# Patient Record
Sex: Male | Born: 1950 | Race: White | Hispanic: No | Marital: Married | Smoking: Former smoker
Health system: Southern US, Community
[De-identification: ages and names within clinical notes are randomized; demographics above are authoritative.]

## PROBLEM LIST (undated history)

## (undated) DIAGNOSIS — J189 Pneumonia, unspecified organism: Secondary | ICD-10-CM

## (undated) DIAGNOSIS — Z8719 Personal history of other diseases of the digestive system: Secondary | ICD-10-CM

## (undated) DIAGNOSIS — M1990 Unspecified osteoarthritis, unspecified site: Secondary | ICD-10-CM

## (undated) DIAGNOSIS — I4891 Unspecified atrial fibrillation: Secondary | ICD-10-CM

## (undated) DIAGNOSIS — I10 Essential (primary) hypertension: Secondary | ICD-10-CM

## (undated) DIAGNOSIS — E039 Hypothyroidism, unspecified: Secondary | ICD-10-CM

## (undated) DIAGNOSIS — I509 Heart failure, unspecified: Secondary | ICD-10-CM

## (undated) DIAGNOSIS — I2510 Atherosclerotic heart disease of native coronary artery without angina pectoris: Secondary | ICD-10-CM

## (undated) DIAGNOSIS — I219 Acute myocardial infarction, unspecified: Secondary | ICD-10-CM

## (undated) DIAGNOSIS — I350 Nonrheumatic aortic (valve) stenosis: Secondary | ICD-10-CM

## (undated) DIAGNOSIS — E119 Type 2 diabetes mellitus without complications: Secondary | ICD-10-CM

## (undated) DIAGNOSIS — G4730 Sleep apnea, unspecified: Secondary | ICD-10-CM

## (undated) DIAGNOSIS — E782 Mixed hyperlipidemia: Secondary | ICD-10-CM

## (undated) DIAGNOSIS — M75100 Unspecified rotator cuff tear or rupture of unspecified shoulder, not specified as traumatic: Secondary | ICD-10-CM

## (undated) HISTORY — DX: Acute myocardial infarction, unspecified: I21.9

## (undated) HISTORY — DX: Unspecified atrial fibrillation: I48.91

## (undated) HISTORY — DX: Essential (primary) hypertension: I10

## (undated) HISTORY — PX: TONSILLECTOMY: SUR1361

## (undated) HISTORY — DX: Unspecified osteoarthritis, unspecified site: M19.90

## (undated) HISTORY — PX: CHOLECYSTECTOMY: SHX55

## (undated) HISTORY — DX: Nonrheumatic aortic (valve) stenosis: I35.0

## (undated) HISTORY — DX: Mixed hyperlipidemia: E78.2

## (undated) HISTORY — DX: Atherosclerotic heart disease of native coronary artery without angina pectoris: I25.10

## (undated) HISTORY — DX: Type 2 diabetes mellitus without complications: E11.9

## (undated) HISTORY — PX: APPENDECTOMY: SHX54

---

## 1996-12-05 DIAGNOSIS — I219 Acute myocardial infarction, unspecified: Secondary | ICD-10-CM

## 1996-12-05 HISTORY — DX: Acute myocardial infarction, unspecified: I21.9

## 2003-03-28 ENCOUNTER — Encounter: Payer: Self-pay | Admitting: Cardiology

## 2003-03-28 ENCOUNTER — Inpatient Hospital Stay (HOSPITAL_COMMUNITY): Admission: RE | Admit: 2003-03-28 | Discharge: 2003-04-05 | Payer: Self-pay | Admitting: *Deleted

## 2003-03-30 ENCOUNTER — Encounter: Payer: Self-pay | Admitting: Thoracic Surgery (Cardiothoracic Vascular Surgery)

## 2003-04-02 ENCOUNTER — Encounter: Payer: Self-pay | Admitting: Thoracic Surgery (Cardiothoracic Vascular Surgery)

## 2003-04-02 ENCOUNTER — Encounter (INDEPENDENT_AMBULATORY_CARE_PROVIDER_SITE_OTHER): Payer: Self-pay | Admitting: *Deleted

## 2003-04-02 DIAGNOSIS — Z951 Presence of aortocoronary bypass graft: Secondary | ICD-10-CM | POA: Insufficient documentation

## 2003-04-02 HISTORY — PX: CORONARY ARTERY BYPASS GRAFT: SHX141

## 2003-04-03 ENCOUNTER — Encounter: Payer: Self-pay | Admitting: Thoracic Surgery (Cardiothoracic Vascular Surgery)

## 2003-04-04 ENCOUNTER — Encounter: Payer: Self-pay | Admitting: Thoracic Surgery (Cardiothoracic Vascular Surgery)

## 2003-04-28 ENCOUNTER — Encounter
Admission: RE | Admit: 2003-04-28 | Discharge: 2003-04-28 | Payer: Self-pay | Admitting: Thoracic Surgery (Cardiothoracic Vascular Surgery)

## 2003-04-28 ENCOUNTER — Encounter: Payer: Self-pay | Admitting: Thoracic Surgery (Cardiothoracic Vascular Surgery)

## 2003-08-07 ENCOUNTER — Ambulatory Visit (HOSPITAL_COMMUNITY): Admission: RE | Admit: 2003-08-07 | Discharge: 2003-08-07 | Payer: Self-pay | Admitting: *Deleted

## 2005-06-14 ENCOUNTER — Ambulatory Visit: Payer: Self-pay | Admitting: Cardiology

## 2005-06-27 ENCOUNTER — Ambulatory Visit: Payer: Self-pay | Admitting: Cardiology

## 2008-12-05 DIAGNOSIS — J189 Pneumonia, unspecified organism: Secondary | ICD-10-CM

## 2008-12-05 HISTORY — DX: Pneumonia, unspecified organism: J18.9

## 2009-03-03 ENCOUNTER — Ambulatory Visit (HOSPITAL_COMMUNITY): Admission: RE | Admit: 2009-03-03 | Discharge: 2009-03-03 | Payer: Self-pay | Admitting: Anesthesiology

## 2009-03-03 ENCOUNTER — Ambulatory Visit: Payer: Self-pay | Admitting: Cardiology

## 2009-03-04 ENCOUNTER — Inpatient Hospital Stay (HOSPITAL_BASED_OUTPATIENT_CLINIC_OR_DEPARTMENT_OTHER): Admission: RE | Admit: 2009-03-04 | Discharge: 2009-03-04 | Payer: Self-pay | Admitting: Cardiology

## 2009-03-04 ENCOUNTER — Ambulatory Visit: Payer: Self-pay | Admitting: Cardiology

## 2009-11-29 ENCOUNTER — Encounter: Payer: Self-pay | Admitting: Cardiology

## 2010-02-23 ENCOUNTER — Encounter: Payer: Self-pay | Admitting: Cardiology

## 2010-03-15 ENCOUNTER — Telehealth: Payer: Self-pay | Admitting: Cardiology

## 2010-06-14 ENCOUNTER — Encounter: Payer: Self-pay | Admitting: Cardiology

## 2010-12-20 ENCOUNTER — Encounter: Payer: Self-pay | Admitting: Cardiology

## 2011-01-03 ENCOUNTER — Encounter: Payer: Self-pay | Admitting: Cardiology

## 2011-01-04 NOTE — Progress Notes (Signed)
Summary: question regarding stress test -march  Phone Note Call from Patient Call back at Home Phone [phone] Call back at [phone]   Caller: Patient Reason for Call: Talk to Nurse Summary of Call: question regarding stress test last march.  Initial call taken by: Lorne Skeens,  March 15, 2010 9:55 AM  Follow-up for Phone Call        [facility] Scherrie Bateman, LPN  March 15, 2010 11:37 AM PT RETURNED CALL  NEEDED INFO SENT TO DR Joylene Draft RE LOW EF STATED ON NUCLEAR STUDY PER OFFICE NOTE FROM 3/ 30/10 EF IS 50% BY CATH WITH PT'S PERMISSION OFFICE NOTE FAXED TO TO DR Rosina Lowenstein NUMBER [phone] Follow-up by: Scherrie Bateman, LPN,  March 15, 2010 4:54 PM

## 2011-01-10 ENCOUNTER — Encounter: Payer: Self-pay | Admitting: Cardiology

## 2011-01-20 NOTE — Letter (Signed)
Summary: External Correspondence/ ECHOCARDIOGRAM REPORT  External Correspondence/ ECHOCARDIOGRAM REPORT   Imported By: Dorise Hiss 01/11/2011 15:55:53  _____________________________________________________________________  External Attachment:    Type:   Image     Comment:   External Document

## 2011-01-20 NOTE — Medication Information (Signed)
Summary: RX Folder/ [facility]  RX Folder/ [facility]   Imported By: Dorise Hiss 01/11/2011 16:03:23  _____________________________________________________________________  External Attachment:    Type:   Image     Comment:   External Document

## 2011-01-20 NOTE — Letter (Signed)
Summary: External Correspondence/ OFFICE VISIT [facility]  External Correspondence/ OFFICE VISIT [facility]   Imported By: Dorise Hiss 01/11/2011 15:59:44  _____________________________________________________________________  External Attachment:    Type:   Image     Comment:   External Document

## 2011-02-11 ENCOUNTER — Encounter: Payer: Self-pay | Admitting: Cardiology

## 2011-02-11 ENCOUNTER — Ambulatory Visit (INDEPENDENT_AMBULATORY_CARE_PROVIDER_SITE_OTHER): Payer: BC Managed Care – PPO | Admitting: Cardiology

## 2011-02-11 DIAGNOSIS — I10 Essential (primary) hypertension: Secondary | ICD-10-CM

## 2011-02-11 DIAGNOSIS — G4733 Obstructive sleep apnea (adult) (pediatric): Secondary | ICD-10-CM | POA: Insufficient documentation

## 2011-02-11 DIAGNOSIS — I359 Nonrheumatic aortic valve disorder, unspecified: Secondary | ICD-10-CM

## 2011-02-11 DIAGNOSIS — I2510 Atherosclerotic heart disease of native coronary artery without angina pectoris: Secondary | ICD-10-CM

## 2011-02-11 DIAGNOSIS — E785 Hyperlipidemia, unspecified: Secondary | ICD-10-CM | POA: Insufficient documentation

## 2011-02-11 DIAGNOSIS — R9430 Abnormal result of cardiovascular function study, unspecified: Secondary | ICD-10-CM

## 2011-02-15 NOTE — Assessment & Plan Note (Signed)
Summary: ABNORNAL  STRESS TEST FH   Visit Type:  Follow-up Primary Provider:  Dr. Doreen Beam   History of Present Illness: 60 year old male referred for cardiology evaluation. He recently underwent a followup Cardiolite for DOT clearance in the setting of known CAD status post CABG. This study result is outlined below.  He indicates no exertional angina or unusual shortness of breath. 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 He underwent previous evaluation with known abnormal myocardial perfusion study in 2010, referred for a cardiac catheterization at that time that demonstrated stable anatomy, treated medically. This report is outlined below.  I reviewed his recent Cardiolite report which indicates inferolateral scar without ischemia, no diagnostic ST segment changes. LVEF was reported at 43%, which is decreased compared to previous assessments, although his LVEF has fluctuated over time by differing modalities. Last echocardiogram done at [facility] is indicated below reporting an LVEF of 70%. We discussed this today.  He otherwise reports compliance with his medications which are reviewed below. No recent use of sublingual nitroglycerin.     Preventive Screening-Counseling & Management  Alcohol-Tobacco     Smoking Status: quit     Year Quit: 1984  Current Medications (verified): 1)  Imdur 30 Mg Xr24h-Tab (Isosorbide Mononitrate) .... Take 1 Tablet By Mouth Once A Day 2)  Synthroid 50 Mcg Tabs (Levothyroxine Sodium) .... Take 1 Tablet By Mouth Once A Day 3)  Glucovance 2.5-500 Mg Tabs (Glyburide-Metformin) .... Take 2 By Mouth in Morning and 1 in Pm 4)  Mobic 15 Mg Tabs (Meloxicam) .... Take 1 Tablet By Mouth Once A Day 5)  Atenolol 25 Mg Tabs (Atenolol) .... Take 1 Tablet By Mouth Once A Day 6)  Caduet 5-20 Mg Tabs (Amlodipine-Atorvastatin) .... Take 1 Tablet By Mouth Once A Day 7)  Nitrostat 0.4 Mg Subl (Nitroglycerin) .Marland Kitchen.. 1 Under Tongue @ Onset  of Chest Pain,you May Repeat Every 5 Min. For Up To 3 Doses. If Chest Pain Continues @3rd  Dose,call 911 & Proceed To Er 8)  Aspirin 325 Mg Tabs (Aspirin) .... Take 1 Tablet By Mouth Once A Day  Allergies (verified): No Known Drug Allergies  Comments:  Nurse/Medical Assistant: The patient's medications and allergies were verbally reviewed with the patient's wife and were updated in the Medication and Allergy Lists.  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 Hyperlipidemia. CAD - 2 vessel s/p CABG Myocardial Infarction - 2000 Diabetes Type 2  Past Surgical History: CABG 4/04, Dr. 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         Otherwise reviewed and negative except as outlined.  Vital Signs:  Patient profile:   60 year old male Height:      68 inches Weight:      224 pounds BMI:     34.18 Pulse rate:   61 / minute BP sitting:   159 / 81  (left arm) Cuff size:   large  Vitals Entered By: Carlye Grippe (February 11, 2011 10:47 AM)  Nutrition  Counseling: Patient's BMI is greater than 25 and therefore counseled on weight management options.  Physical Exam  Additional Exam:  Obese male in no acute distress. HEENT: Conjunctiva and lids normal, oropharynx with moist because it appeared Neck: Supple, no elevated JVP, soft carotid bruits versus radiation of cardiac murmur. No thyromegaly. Lungs: Clear to auscultation,  nonlabored. Cardiac: Regular rate and rhythm with 2-3/6 systolic murmur heard best at the base radiating toward the apex, no S3 gallop. Abdomen: Soft, nontender, bowel sounds present, both adamantly. Skin: Warm and dry. Musculoskeletal: No kyphosis. Extremities: No pitting edema, distal pulses 1-2+. Neuropsychiatric: Alert and oriented x3,  Affect appropriate.   Echocardiogram  Procedure date:  02/23/2010  Findings:      [facility]:  Normal LVEF of 70%, moderate LVH, mild left atrial enlargement, trace mitral regurgitation, trace tricuspid regurgitation, mild aortic stenosis and regurgitation, no pericardial effusion.  Nuclear Study  Procedure date:  01/03/2011  Findings:      Exercise Cardiolite, maximum workload 10.1 METs, no diagnostic ST segment changes. LVEF 43%, moderate RV dilatation, fixed inferolateral defect consistent with scar, t.i.d. ratio 1.2.  Cardiac Cath  Procedure date:  03/04/2009  Findings:       3. The left anterior descending artery courses to the apex leading       into the diagonal, the vessel was calcified.  There is about 30-40%       narrowing here, but it does not appear to be high grade.  There is       mild luminal irregularity of the LAD throughout the remainder, with       luminal irregularities noted at the apical portion of the LAD.  The       diagonal is a moderate-sized vessel that bifurcates and has smooth       40-50% segmental plaquing in the midportion of this vessel.  None       of this appears critical.   4. The circumflex has probably 30-40% ostial narrowing and then it is       basically a competitive filling going into a large marginal.  There       is a small AV circumflex.   5. The saphenous vein graft to the marginal is intact and retrograde.       It fills a marginal that is moderately plaqued and has a fairly       large side branch distal to the graft insertion, the vessel has a       tiny amount of antegrade  filling, but this vessel is known to be       occluded, and there is flow out distally noted on the original set       of angiograms.  This is not evident on the current study.   6. The right coronary artery is totally occluded in its midportion.   7. There is a composite graft that goes to the PDA.  Importantly, this       composite graft consists of the saphenous vein graft, composited       into a short right internal mammary.  At the location of the       junction, there is a slight kink, but it does not appear to be       narrow or flow limiting.  It appears stable from the previous       study.  The vein graft inserts nicely into the PDA and the  antegrade portion of the PDA is widely patent.  The retrograde       portion of the PDA was fairly severely diseased at the time of       surgery, and there is 50-60% narrowing involving this retrograde       portion leading into a small to moderate posterolateral segment.  Impression & Recommendations:  Problem # 1:  CARDIOVASCULAR FUNCTION STUDY, ABNORMAL (ICD-794.30)  Most consistent with inferolateral scar, no active ischemia. LVEF was reported at 43% which, if true, would represent a change since last assessment. We discussed this, and weill plan a 2-D echocardiogram to better clarify LVEF. If in fact this remains in the low normal to normal range as previously assessed, doubt that further evaluation will be required in light of his asymptomatic status, and he should be able to continue on medical therapy with routine follow up in 6 months.  Problem # 2:  CAROTID BRUIT (ICD-785.9)  Could be radiation of cardiac murmur. Will obtain carotid Dopplers to clarify.  Orders: Carotid Duplex (Carotid Duplex)  Problem # 3:  CORONARY ATHEROSCLEROSIS NATIVE CORONARY ARTERY (ICD-414.01)  Status post coronary artery bypass grafting in 2004, outlined above. Patient is symptomatically stable. Cardiac catheterization in 2010 suggested continuing  medical therapy.  His updated medication list for this problem includes:    Imdur 30 Mg Xr24h-tab (Isosorbide mononitrate) .Marland Kitchen... Take 1 tablet by mouth once a day    Atenolol 25 Mg Tabs (Atenolol) .Marland Kitchen... Take 1 tablet by mouth once a day    Nitrostat 0.4 Mg Subl (Nitroglycerin) .Marland Kitchen... 1 under tongue @ onset of chest pain,you may repeat every 5 min. for up to 3 doses. if chest pain continues @3rd  dose,call 911 & proceed to er    Aspirin 325 Mg Tabs (Aspirin) .Marland Kitchen... Take 1 tablet by mouth once a day  Orders: 2-D Echocardiogram (2D Echo)  Problem # 4:  HYPERLIPIDEMIA (ICD-272.4)  Followed by Dr. Sherril Croon.  His updated medication list for this problem includes:    Caduet 5-20 Mg Tabs (Amlodipine-atorvastatin) .Marland Kitchen... Take 1 tablet by mouth once a day  Problem # 5:  HYPERTENSION, BENIGN (ICD-401.1)  Blood pressure elevated today. Continue regular followup with Dr. Sherril Croon.  His updated medication list for this problem includes:    Atenolol 25 Mg Tabs (Atenolol) .Marland Kitchen... Take 1 tablet by mouth once a day    Caduet 5-20 Mg Tabs (Amlodipine-atorvastatin) .Marland Kitchen... Take 1 tablet by mouth once a day    Aspirin 325 Mg Tabs (Aspirin) .Marland Kitchen... Take 1 tablet by mouth once a day  Problem # 6:  AORTIC VALVE DISORDERS (ICD-424.1)  Reportedly mild aortic stenosis by echocardiogram at [facility] last year. Will follow up on this as well.  His updated medication list for this problem includes:    Imdur 30 Mg Xr24h-tab (Isosorbide mononitrate) .Marland Kitchen... Take 1 tablet by mouth once a day    Atenolol 25 Mg Tabs (Atenolol) .Marland Kitchen... Take 1 tablet by mouth once a day    Nitrostat 0.4 Mg Subl (Nitroglycerin) .Marland Kitchen... 1 under tongue @ onset of chest pain,you may repeat every 5 min. for up to 3 doses. if chest pain continues @3rd  dose,call 911 & proceed to er  Patient Instructions: 1)  Your physician wants you to follow-up in: 6 months. You will receive a reminder letter in the mail one-two months in advance. If you don't  receive a letter, please call our office to schedule the follow-up appointment. 2)  Your physician has requested that  you have a carotid duplex. This test is an ultrasound of the carotid arteries in your neck. It looks at blood flow through these arteries that supply the brain with blood. Allow one hour for this exam. There are no restrictions or special instructions.  3)  Your physician has requested that you have an echocardiogram.  Echocardiography is a painless test that uses sound waves to create images of your heart. It provides your doctor with information about the size and shape of your heart and how well your heart's chambers and valves are working.  This procedure takes approximately one hour. There are no restrictions for this procedure. 4)  If the results of your test are normal or stable, you will receive a letter. If they are abnormal, the nurse will contact you by phone.  5)  Your physician recommends that you continue on your current medications as directed. Please refer to the Current Medication list given to you today.

## 2011-02-18 ENCOUNTER — Encounter: Payer: Self-pay | Admitting: Cardiology

## 2011-02-18 DIAGNOSIS — I359 Nonrheumatic aortic valve disorder, unspecified: Secondary | ICD-10-CM

## 2011-02-22 ENCOUNTER — Telehealth: Payer: Self-pay | Admitting: *Deleted

## 2011-02-22 NOTE — Telephone Encounter (Signed)
Pt called for results of Echo and Carotid. Pt notified of results and verbalized understanding. Results will be faxed to Dr. [facility]lege[facility] at [phone]

## 2011-04-19 NOTE — Cardiovascular Report (Signed)
NAMEELDRA, WORD               ACCOUNT NO.:  [id number]   MEDICAL RECORD NO.:  [id number]           PATIENT TYPE:   LOCATION:                                 FACILITY:   PHYSICIAN:  Arturo Morton. 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  He  previously underwent catheterization in 2004 at which time he had an  attempted percutaneous intervention at that time by Dr. Chales Abrahams.  At that  time, he also had a wall motion abnormality involving the inferolateral  segment.  They were unable to open the circumflex, and he subsequently  underwent revascularization surgery by Dr. Cornelius Moras.  Followup  catheterization in 2004 did reveal patent grafts and I have reviewed  those studies.  He has been asymptomatic, but has an abnormal myocardial  perfusion imaging study suggesting ischemia.  He does have known total  occlusion of the circumflex OM after the graft insertion site.  He was  seen by Dr. Daleen Squibb and referred for diagnostic cardiac catheterization.   PROCEDURE:  1. Left heart catheterization.  2. Selective coronary arteriography.  3. Selective left ventriculography.  4. Saphenous vein graft angiography.   DESCRIPTION OF PROCEDURE:  The patient was brought to the  Catheterization Laboratory and prepped and draped in the usual fashion.  Through an anterior puncture, the right femoral artery was easily  entered and a 4-French sheath was placed.  We then took views of the  left and right coronary arteries.  We needed to use a left bypass for  the OM graft, used a right bypass with composite RIMA vein to the distal  right coronary circulation.  There is no internal mammary done.  Following this, central aortic and left ventricular pressures were  measured with pigtail and ventriculography was performed in the RAO  projection.  There were  no major complications with the procedure.  He  was taken to the holding area for sheath removal.   HEMODYNAMIC DATA:  1. The central aortic pressure was 149/78, mean 105.  2. Left ventricular pressure 153/17 with an LVEDP of 32.  There was      minimal gradient on pullback across the aortic valve.   ANGIOGRAPHIC DATA:  1. On plain fluoroscopy, there is moderate calcification of the      coronary vessels.  One can see this in both the LAD and the right      coronary.  2. The left main is a large-caliber vessel that is free of critical      disease.  It tapers at the distal end.  3. The left anterior descending artery courses to the apex leading      into the diagonal, the vessel was calcified.  There is about 30-40%      narrowing here, but it does not appear to be high grade.  There is      mild luminal irregularity of the LAD throughout the remainder, with  luminal irregularities noted at the apical portion of the LAD.  The      diagonal is a moderate-sized vessel that bifurcates and has smooth      40-50% segmental plaquing in the midportion of this vessel.  None      of this appears critical.  4. The circumflex has probably 30-40% ostial narrowing and then it is      basically a competitive filling going into a large marginal.  There      is a small AV circumflex.  5. The saphenous vein graft to the marginal is intact and retrograde.      It fills a marginal that is moderately plaqued and has a fairly      large side branch distal to the graft insertion, the vessel has a      tiny amount of antegrade filling, but this vessel is known to be      occluded, and there is flow out distally noted on the original set      of angiograms.  This is not evident on the current study.  6. The right coronary artery is totally occluded in its midportion.  7. There is a composite graft that goes to the PDA.  Importantly, this      composite graft consists of the saphenous vein graft,  composited      into a short right internal mammary.  At the location of the      junction, there is a slight kink, but it does not appear to be      narrow or flow limiting.  It appears stable from the previous      study.  The vein graft inserts nicely into the PDA and the      antegrade portion of the PDA is widely patent.  The retrograde      portion of the PDA was fairly severely diseased at the time of      surgery, and there is 50-60% narrowing involving this retrograde      portion leading into a small to moderate posterolateral segment.  8. Ventriculography in the RAO projection reveals overall reduced LV      function with a mid inferobasal wall motion abnormality.  This has      been seen on previous studies.   CONCLUSION:  1. Preserved overall left ventricular function with an inferobasal      wall motion abnormality as previously noted.  2. Continued patency of the left anterior descending diagonal system      with scattered plaquing as noted above.  3. Continued patency of the saphenous vein graft into the marginal      with findings as previously noted with an occluded marginal beyond      the graft insertion, but excellent retrograde filling into a      moderately plaqued retrograde circumflex.  4. Patent composite graft representing a saphenous vein graft      composited into an internal mammary, inserted onto the posterior      descending artery with some retrograde narrowing after the      posterior descending artery insertion.   DISPOSITION:  The patient is asymptomatic.  He has a scar with ischemia  noted in the inferolateral segment.  This would correspond to the wall  motion abnormality and also with a known occlusion after the OM graft.  There is also some retrograde abnormalities, but this has been stable  since 2004 and without symptoms of medical approach  to therapy would be  recommended.  Followup will be with Dr. Daleen Squibb.      Arturo Morton. Riley Kill, MD,  [facility]  Electronically Signed     TDS/MEDQ  D:  03/04/2009  T:  03/04/2009  Job:  [id number]   cc:   Doreen Beam, MD  Jesse Sans. Daleen Squibb, MD, [facility]  Clarence H. Cornelius Moras, M.D.

## 2011-04-19 NOTE — Assessment & Plan Note (Signed)
[facility]                       [place] [facility] OFFICE NOTE   NAME:[name], SAKIB NOGUEZ                      MRN:          [id number]  DATE:03/03/2009                            DOB:          08/12/1951    CHIEF COMPLAINT:  Abnormal stress test.   HISTORY OF PRESENT ILLNESS:  Mr. Thaddeus Evitts is a delightful quite  humorous 60 year old married white male who comes today for evaluation  of the above.   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  This was performed at [facility], and read by  Dr. Harland Dingwall.   He exercised for a total of 7 minutes and 42 seconds to achieve work  level of 8 METS.  Maximum heart rate was 138 with 85% of predicted  maximum heart rate.  He had had no chest pain.  No arrhythmias and no ST-  segment changes.  He had normal blood pressure response.   He had inferoposterior basal wall scar and had some peri-infarct  ischemia that including entire inferolateral wall and from the apex to  the base.   PAST MEDICAL HISTORY:  Significant for a myocardial infarction in late  90s.  He subsequently had angina and had to be reevaluated in April  2004.  At that time, his cardiac catheterization demonstrated severe two-  vessel coronary artery disease with an EF of 50%.  He essentially had a  20% of the distal left main, large LAD with diffuse 20-30% stenoses,  large first diagonal with a proximal 50% stenoses, left circumflex was  small with 2 marginal branches with a ostial 30% circumflex.  There was  a severe disease of up to 70% extending along the AV groove, the first  marginal branch had an osteal 50%, second marginal was totally occluded  and the right coronary artery was dominant with 100% occlusion in the  mid section.  This was apparently the cause of his infarct in 2000 or  late 90s.  He had some collaterals in the left system to the distal  right.   PAST  MEDICAL HISTORY:  Significant for type 2 diabetes times about 2  years.  He has a history of hypertension.  He does not smoke.  He has  struggles with his weight and I do not get the feeling he exercises a  lot.   CURRENT MEDICATIONS:  1. Atenolol 25 mg p.o. daily.  2. Isosorbide mononitrate 30 mg a day.  3. Glyburide/metformin 2.5/500 mg 2 in the morning and 1 in the      evening.  4. Caduet 5/20 daily.  5. Meloxicam 15 mg a day.  6. Levothyroxine 50 mcg a day.  7. Aspirin 325 mg a day.  8. Multivitamin daily.   He has no known drug allergies.   SOCIAL HISTORY:  He is a Naval architect as mentioned above.  He is  married.  His wife is with him today and she is very pleasant.  They  have 3 children.   FAMILY HISTORY:  Negative for premature coronary artery disease.  REVIEW OF SYSTEMS:  He has some hearing loss.  He has had history of an  old ulcer about 13 years ago at his stomach.  The rest of the review of  systems are negative.   PHYSICAL EXAMINATION:  VITAL SIGNS:  Blood pressure is 130/80 in the  right arm, pulse is 64 and regular.  His weight is 228.  HEENT:  Premature graying.  Rest of his HEENT was normal.  NECK:  Carotid upstrokes were equal bilaterally without bruits.  Thyroid  is not enlarged.  Trachea is midline.  There is no JVD.  CHEST:  Lungs are clear to auscultation and percussion.  HEART:  A poorly appreciated PMI.  Normal S1 and S2.  No murmur, rub, or  gallop.  ABDOMEN:  Soft with a slightly protuberant abdomen.  EXTREMITIES:  No cyanosis, clubbing, or edema.  Pulses are intact.  NEUROLOGIC:  Intact.  SKIN:  Unremarkable.   His electrocardiogram demonstrates normal sinus rhythm with some LVH  with an old inferoposterior infarct pattern with T-wave changes  particularly in the lateral leads.   Laboratory data from the outside showed an elevated TSH is 6.38 on  January 26, 2009.   ASSESSMENT:  1. Positive nuclear stress study, inferior lateral wall  ischemia.  He      is asymptomatic at this point in time, though he has had a previous      inferoposterior infarct.  His ejection fraction on the nuclear scan      was 39% and by cath 50%.  2. Hypertension.  3. Type 2 diabetes.  4. Hyperlipidemia.  5. Obesity.   RECOMMENDATIONS:  Outpatient cardiac catheterization.  Indications,  risk, and potential benefits have been discussed.  He and his wife  agreed to proceed.  We will proceed with this study tomorrow.  Precath  labs, an x-ray will be obtained.     Thomas C. Daleen Squibb, MD, [facility]  Electronically Signed   TCW/MedQ  DD: 03/03/2009  DT: 03/04/2009  Job #: [id number]   cc:   Doreen Beam, MD

## 2011-04-22 NOTE — Consult Note (Signed)
NAME:  [name], [name]                         ACCOUNT NO.:  [id number]   MEDICAL RECORD NO.:  [id number]                   PATIENT TYPE:  OIB   LOCATION:  2901                                 FACILITY:  [facility]   PHYSICIAN:  Salvatore Decent. Cornelius Moras, M.D.              DATE OF BIRTH:  1951/03/26   DATE OF CONSULTATION:  03/29/2003  DATE OF DISCHARGE:                                   CONSULTATION   PRIMARY CARDIOLOGIST:  Jonelle Sidle, M.D.   PRIMARY CARE PHYSICIAN:  Dhruv Vyas.   REASON FOR CONSULTATION:  Severe two vessel coronary artery disease, status  post attempted percutaneous coronary intervention with class IV unstable  angina.   HISTORY OF PRESENT ILLNESS:  The patient is a 60 year old gentleman from  [place], [place], who works as a Production designer, theatre/television/film  in [place].  He has a history of coronary artery disease,  hypertension, type 2 diabetes mellitus, and a remote history of tobacco use.  He apparently suffered an acute inferior myocardial infarction approximately  four years ago.  He lived in [place] at the time and he underwent  catheterization with attempted percutaneous coronary intervention.  However,  he was told that they were unable to open vessel because it had been closed  too long.  He had been treated medically since then.  He apparently also  underwent repeat catheterization in September of 2002 in [place].  He  was treated medically again at that time and the findings at that  catheterization are unknown.  The patient more recently moved to [facility] where he began to come under the care of Dr. Sherril Croon.  He recently  went to see Dr. Sherril Croon and was noted to complain of increasing symptoms of  angina.  He was referred to Dr. Diona Browner and subsequently scheduled for  elective cardiac catheterization.  This was performed yesterday by Dr.  Chales Abrahams.  After initial diagnostic catheterization, an attempt at percutaneous  coronary intervention for a chronically occluded subbranch of a large  bifurcating circumflex marginal branch was performed.  This was complicated  by acute coronary dissection.  The patient was admitted for further  management and therapy and cardiac surgical consultation has been requested.   REVIEW OF SYSTEMS:  GENERAL:  The patient reports feeling well other than  worsening exertional fatigue.  His appetite is good.  He has not been  gaining or losing weight. CARDIAC:  Notable for longstanding history of  angina, which he states he had ever since his original heart attack four  years ago.  He described substernal chest pressure without radiation.  This  comes and goes sporadically and does seem to be exacerbated by physical  activity but still can come and go even when he is at rest.  His symptoms  have increased in severity in severity in recent months.  Previously his  symptoms usually were  relieved by administration of sublingual  nitroglycerin.  However, more recently he states that nitroglycerin does not  seem to work anymore.  He denies any associated shortness of breath,  diaphoresis, nausea, He denies any syncopal episodes.  He has had occasional  palpitations.  He reports no lower extremity edema, PND, nor orthopnea.  RESPIRATORY:  Essentially negative.  The patient does report dry cough  recently which he attributes to seasonal allergies and sinusitis.  GASTROINTESTINAL:  Negative.  The patient reports good bowel function with  no difficulty swallowing.  He denies history of hematochezia, hematemesis,  melena.  MUSCULOSKELETAL:  Notable for arthritis afflicting both feet.  This  is chronic and stable.  NEUROLOGIC:  Negative.  The patient denies transient  monocular blindness or transient numbness or weakness involving either upper  or lower extremity.  ENDOCRINE:  This is notable in that the patient has  history of type 2 diabetes mellitus.  He apparently went on Weight  Watcher's  several years ago and lost some weight.  Prior to this, he was treated  medically with oral agents for diabetes as well as oral medication for  hypertension.  After he lost the weight, his blood sugars came under better  control and he was able to come off of oral agents and antihypertensive  medications.  INFECTIOUS:  Negative with the exception of the fact that the  patient thinks he has been having problems with sinusitis.  He reports  aching in upper teeth bilaterally recently.  He denies fever or chills.  HEENT:  Otherwise negative other than possible sinusitis.  The patient  reports no loose teeth.  His eyesight has not changed.  PSYCHIATRIC:  Negative.  PERIPHERAL VASCULAR:  Negative.  The patient denies symptoms of  claudication.   PAST MEDICAL HISTORY:  This is notable for history of coronary artery  disease, hypertension, type 2 diabetes mellitus, and arthritis.  The patient  reports that he was treated for peptic ulcers at age 44 but has had no  trouble since.   PAST SURGICAL HISTORY:  This is notable for cholecystectomy in 1980.   SOCIAL HISTORY:  The patient is married and lives with his wife in  [place], [place].  He is reasonably active physically.   HABITS:  He quit smoking 17 years ago.  He denies significant alcohol  consumption.   MEDICATIONS PRIOR TO ADMISSION:  Aspirin, atenolol, Lipitor, isosorbide, and  Arthrotek.   ALLERGIES:  No known drug allergies or sensitivities.   PHYSICAL EXAMINATION:  GENERAL APPEARANCE:  A well-appearing white male who  appears his stated age in no acute distress.  VITAL SIGNS:  He is afebrile, normotensive and in normal sinus rhythm.  HEENT:  Essentially within normal limits.  NECK:  Supple.  There is no jugular venous distension, no carotid bruits are  noted.  LYMPHS:  There is no cervical or supraclavicular lymphadenopathy. CHEST:  Auscultation of the chest demonstrates clear and symmetrical breath  sounds  bilaterally.  No wheezes or rhonchi are appreciated.  ABDOMEN:  Slightly obese, soft, nontender.  There are no palpable masses.  Bowel sounds are present.  RECTAL AND GU:  Examinations are both deferred.  EXTREMITIES:  Lower extremities are warm and well perfused.  Distal pulses  are easily palpable in the posterior tibial position in both lower legs.  There is no veinous insufficiency.  NEUROLOGIC:  Grossly nonfocal and symmetrical throughout.   The remainder of the physical examination is noncontributory.   DIAGNOSTIC TESTS:  Cardiac catheterization performed by Dr. Chales Abrahams yesterday  has been reviewed.  This demonstrates what appears to be chronic occlusion  of the right coronary artery, some left to right collateral filling of  distal branches.  The distal branches of the right coronary circulation are  never well visualized, but there does appear to be a posterior descending  coronary artery and probably one posterolateral branch which might be large  enough for bypass grafting.  There also appears to be chronic occlusion of  the lateral subbranch of the large first circumflex marginal branch.  There  is 70% proximal stenosis of the left circumflex coronary artery beyond the  bifurcation.  The medial subbranches of medium to large vessel.  There was  insignificant disease in the large left anterior descending coronary artery.  Left ventricular function was very mildly reduced with mild inferior  hypokinesis.  An attempt at percutaneous coronary intervention for the  chronically occluded lateral subbranch of the circumflex marginal branch was  performed and complicated by acute coronary dissection.  At completion of  the procedure, the vessel was patent with antegrade flow but the dissection  appeared to compromise the vessel proximal and including the area of its  bifurcation.   IMPRESSION AND RECOMMENDATIONS:  Severe two vessel coronary artery disease  with aggressive symptoms of  class IV angina, status post attempted  percutaneous coronary intervention yesterday complicated by acute coronary  dissection of a moderate size subbranch of circumflex marginal branch.  Under the circumstances, I believe that the patient would probably better be  treated by surgical revascularization.  Alternatively, long-term medical  therapy could be continued.  This would be at some risk of myocardial  infarction related to possible acute occlusion of the circumflex marginal  branch.  However, the lateral subbranch of this vessel may not even be  graftable at the time of surgery because of its recent dissection, or even  with grafting it may be at risk for subsequent occlusion.  Nevertheless, I  believe probably the best long term treatment will be surgery.  With the  absence of significant disease in the left anterior descending coronary  artery there is probably no long-term survival benefit with surgery. However, the patient seems to be having a fair amount of symptoms and I  think he would be better off in the long run.  Furthermore, the circumflex  coronary disease likely compromises what collateral flow currently perfuses  the remainder of the inferior wall that was involved by his previous  myocardial infarction.   PLAN:  I have outlined options at length with the patient and his wife.  All  of their questions have been addressed.  The patient desires to think things  over before making final decision.  We will continue to follow along.                                               Salvatore Decent. Cornelius Moras, M.D.    CHO/MEDQ  D:  03/29/2003  T:  03/31/2003  Job:  [id number]   cc:   Jonelle Sidle, M.D. [facility]   Dhruv Vyas  [address]  [place]  [place] [address]  Fax: [phone]   [facility]  [place], [place]

## 2011-04-22 NOTE — Cardiovascular Report (Signed)
NAME:  [name], [name]                         ACCOUNT NO.:  [id number]   MEDICAL RECORD NO.:  [id number]                   PATIENT TYPE:  OIB   LOCATION:  2899                                 FACILITY:  [facility]   PHYSICIAN:  Veneda Melter, M.D.                   DATE OF BIRTH:  December 26, 1950   DATE OF PROCEDURE:  03/28/2003  DATE OF DISCHARGE:                              CARDIAC CATHETERIZATION   PROCEDURE PERFORMED:  1. Left heart catheterization.  2. Left ventriculogram.  3. Selective coronary angiography.  4. Attempted percutaneous coronary intervention of obtuse marginal 2.   DIAGNOSES:  1. Severe 2-vessel coronary artery disease.  2. Mild left ventricular systolic dysfunction.  3. Unstable angina.   HISTORY:  The patient is a 60 year old gentleman with a history of coronary  artery disease who suffered and inferior wall myocardial infarction in 2000.  At that time, he was found to have occluded RCA.  This was treated  medically.  He has done well until recently when he has had crescendo angina  and has become unstable.  He is referred for further assessment.   TECHNIQUE:  Informed consent was obtained.  The patient was brought to the  catheterization lab.  A 6-French sheath was placed in the right femoral  artery using the modified Seldinger technique.  JL4 and JR4 6-French  catheters were then used to engage the left and right coronary arteries, and  selective angiography was performed in various projections using manual  injections of contrast.  A 6-French pigtail catheter was advanced to the  left ventricle, and left ventriculogram performed using power injections of  contrast in 2 views.  Initial findings are as follows:   FINDINGS:  1. Left main trunk:  Large caliber vessel with distal narrowing of 20%.  2. LAD:  This is a large caliber vessel that provides a large first diagonal     branch in the proximal segment that provides several sub branches.  The     LAD has  mild diffuse disease of 20% to 30%.  The large first diagonal     branch has a proximal narrowing of 50%.  3. Left circumflex artery:  This is a small caliber vessel that provides 2     marginal branches in the mid section.  There is a proximal AV segment     that extends to the distal RCA.  The AV circumflex in the ostial segment     has mild disease of 30%.  Following the AV continuation, there is severe     disease of 70% extending into the distal marginal branches.  The first     marginal branch has an ostial narrowing of 50%.  The second marginal     branch is occluded proximally.  TIMI grade 1 flow is noted distally.  4. Right coronary:  Dominant.  This begins as a large caliber vessel  that     provides a posterior descending artery and 2 posterior ventricular     branches in the terminal segment.  The right coronary artery is 100%     occluded in the mid section.  The PDA and 2 posterior ventricular     branches fill via collaterals from the left coronary system as well as     faint antegrade flow from the RCA.  5. LV:  Mildly dilated end systolic and end diastolic dimensions.  Overall     left ventricular function is low normal.  Ejection fraction approximately     50%.  There is akinesis of the mid inferior wall.  No mitral     regurgitation is appreciated.   HEMODYNAMICS:  1. LV pressure is 125/10.  2. Aortic is 12/70.  3. LVEDP equals 15.   These findings were reviewed with the patient, and we elected to proceed  with attempt at percutaneous intervention of the second marginal branch in  order to stabilize the patient's symptoms.  The patient was given heparin  and Integrilin on a weight-adjusted basis to maintain an ACT of  approximately 250 seconds.  A 7-French sheath was placed in the right groin,  and a 7-French Voda left 3.5 guide catheter was used to engage the left  coronary artery.  Using a 2.5 x 15-mm over-the-wire Maverick balloon for  support, a 0.014-inch extra  support wire was advanced to the takeoff of the  second marginal branch.  Several attempts were made to cross the lesion.  These proved unsuccessful, and repeat angiography suggested vessel  dissection.  Using a Cross-It wire, several further attempts were made, and  the wire passed into the distal vessel.  The balloon, unfortunately, would  not advance, and a 1.5 x 15-mm Maverick balloon was introduced.  This was  successfully advanced into the distal vessel, and manual injection of  contrast confirmed position within the true lumen.  A single inflation was  performed distally at 6 atmospheres for 30 seconds, and 2 additional  inflations proximally at 8 atmospheres for 30 seconds.  Repeat angiography  did not show improvement in vessel flow.  A 2.5 x 15-mm Maverick balloon was  introduced, and 2 inflations performed within the proximal segment at the  site of occlusion at 6 atmospheres for 30 seconds.  Repeat angiography did  show improvement in flow; however, there was evidence of extensive  dissection along the entire length of the second marginal branch.  An  attempt was then made to advance an intravascular ultrasound probe for  assessment; however, this could not be advanced through the AV circumflex.  A 2.25 x 24-mm Pixel stent was introduced, and an attempt made to advance  this to the distal lesion; however, again, this could not be advanced to the  AV circumflex.  At this point, it was felt that further intervention would  be unsuccessful without treatment of the AV circumflex, and there was some  risk of compromise to the collaterals to the distal RCA.  I thus elected to  abort the procedure at this point.  The equipment was removed, and final  angiography performed showing no extravasation of contrast.  The guide  catheter was then removed, and the sheath secured into position.  During the  case, Integrilin and heparin were discontinued.  The patient was then transferred to the  holding area in stable condition.   FINAL RESULT:  Unsuccessful percutaneous coronary intervention of the second  marginal branch,  left circumflex artery, with persistence of 100% occlusion  and TIMI grade 1 flow.   ASSESSMENT AND PLAN:  The patient is a 60 year old gentleman with severe 2-  vessel coronary artery disease.  Due to progression of symptoms, he will be  considered for surgical revascularization.                                               Veneda Melter, M.D.    NG/MEDQ  D:  03/28/2003  T:  03/31/2003  Job:  [id number]   cc:   Doreen Beam  [address]  [place]  [place] [address]  Fax: [phone]   Jonelle Sidle, M.D. [facility]

## 2011-04-22 NOTE — Op Note (Signed)
NAME:  [name], [name]                         ACCOUNT NO.:  [id number]   MEDICAL RECORD NO.:  [id number]                   PATIENT TYPE:  INP   LOCATION:  2306                                 FACILITY:  [facility]   PHYSICIAN:  Salvatore Decent. Cornelius Moras, M.D.              DATE OF BIRTH:  1951-04-22   DATE OF PROCEDURE:  04/02/2003  DATE OF DISCHARGE:                                 OPERATIVE REPORT   PREOPERATIVE DIAGNOSIS:  Severe two-vessel coronary artery disease with  progressive angina, status post attempted percutaneous coronary intervention  of circumflex marginal branch.   POSTOPERATIVE DIAGNOSIS:  Severe two-vessel coronary artery disease with  progressive angina, status post attempted percutaneous coronary intervention  of circumflex marginal branch.   OPERATION PERFORMED:  Median sternotomy for coronary artery bypass grafting  times two (composite saphenous vein and free right internal mammary artery  to posterior descending coronary artery, saphenous vein graft to circumflex  marginal branch with coronary endarterectomy).   SURGEON:  Salvatore Decent. Cornelius Moras, M.D.   ASSISTANT:  Toribio Harbour, R.N.   ANESTHESIA:  General.   INDICATIONS FOR PROCEDURE:  The patient is a 60 year old male from  [place], [place] with history of coronary artery disease status post  inferior myocardial infarction in February 2000.  The patient also has a  history of type 2 diabetes mellitus.  He presents with a two to three-month  history of progressive angina.  He underwent cardiac catheterization on  April 23 by Dr. Chales Abrahams.  This revealed severe two-vessel coronary artery  disease with chronic occlusion of the right coronary artery as well as  chronic occlusion of a large lateral subbranch or the large first circumflex  marginal branch.  The patient underwent attempted percutaneous coronary  intervention of this circumflex marginal branch.  This was complicated by  acute coronary dissection.  The  patient remained stable and is now brought  in for elective surgical revascularization.  A full consultation note has  been dictated previously.   DESCRIPTION OF PROCEDURE:  The patient was brought to the operating room  after providing full informed consent and identified.  He was placed in  supine position on the operating table.  Central monitoring was established  by the anesthesia service under the direction of Dr. Arta Bruce.  Specifically, a Swann-Ganz catheter was placed through the right internal  jugular approach.  A radial arterial line was placed.  Intravenous  antibiotics were administered.  Following induction with general  endotracheal anesthesia, a Foley catheter was placed.  The patient's chest,  abdomen both groins and both lower extremities were prepared and draped in  sterile manner.   A median sternotomy incision was performed.  The right internal mammary  artery was dissected from the chest wall and prepared for bypass grafting.  The right internal mammary artery was slightly small caliber but otherwise  good quality conduit and has excellent forward flow.  Simultaneously,  saphenous vein was obtained from the patient's right thigh, using endoscopic  vein harvest technique.  The majority of the vein is good quality although  portions of it in the middle are somewhat small caliber due to bifurcated  system.  The portions of vein utilized during the procedure were all notably  normal caliber and good quality.  The patient was heparinized systemically.   The pericardium was opened.  The ascending aorta was normal in appearance.  The ascending aorta and right atrium were cannulated for cardiopulmonary  bypass.  Adequate apposition was verified.  Cardiopulmonary bypass was begun  and the surface of the heart was inspected. Distal sites were selected for  coronary artery bypass grafting.  Of note, there is diffuse scarring in the  inferior and posterolateral wall  consistent with old myocardial infarction.  The circumflex marginal branch bifurcates proximally. The medial sub branch  was small and felt too small for grafting.  The lateral sub branch was large  and is the vessel which was recently intervened upon as noted previously.  This specimen was diffusely diseased with dissection present throughout and  appears to be a poor quality target.  The distal right coronary artery gives  rise to posterior descending coronary artery and one posterolateral branch.  The posterolateral branch is too small for grafting.  Both vessels are  notably diseased with atherosclerosis.  Based upon distal targets for  grafting, it is clear that the right internal mammary artery could not be  utilized as an in situ graft.  Therefore, the right internal mammary artery  was transected proximally just after its take off from the subclavian artery  and the proximal stump was doubly oversewn with suture ligatures.  The  posterior descending coronary artery was clearly felt to be the best target  for grafting, so the right internal mammary artery was chosen to be utilized  for this.  However, the right internal mammary artery as a free graft still  is not long enough to reach the site of distal grafting in the proximal  ascending aorta.  Cardioplegia catheter was placed in the ascending aorta.  A temperature probe was placed in the left ventricular septum.  The patient  was cooled to 33 degrees systemic temperature.  The aortic cross-clamp was  applied and cardioplegia was delivered in an antegrade fashion through the  aortic root.  Iced saline slush was applied for topical hypothermia.  The  initial cardioplegic arrest and myocardial cooling were felt to be  excellent. Repeat doses of cardioplegia are administered intermittently  throughout the cross-clamp portion of the operation both through the aortic root and down the subsequently placed vein grafts internal mammary  artery  graft to maintain septal temperature below 15 degrees centigrade. The  following distal coronary anastomoses were performed:  (1)  The circumflex  marginal branch was grafted with a saphenous vein graft  in end-to-side  fashion.  This was the large lateral sub branch of the bifurcating  circumflex marginal branch.  This vessel was very poor quality and on entry  in to the vessel, the vessel was dissected with chronic atherosclerotic  plaque. Endarterectomy was required in order to salvage the vessel.  Carotid  endarterectomy was performed in standard fashion and a large arteriotomy was  created.  A 1.0 probe will pass quite a way distally after completion of the  endarterectomy.  The saphenous vein graft was then sewn in an end-to-side  fashion with a large patch angioplasty.  (2)  The posterior descending  coronary artery was grafted with the right internal mammary artery in end-to-  side fashion.  This vessel measures 1.3 mm in diameter and is of fair  quality at best.  It is fairly diffusely diseased.  A 1.0 probe will pass in  both directions.  Both proximal saphenous vein anastomoses were performed  directly to the ascending aorta prior to the removal of the aortic cross-  clamp.  Because the free internal mammary artery will not reach all the way  to the ascending aorta, a short segment of saphenous vein was sewn initially  to the ascending aorta with subsequent end-to-end anastomosis of the  saphenous vein to the proximal end of the right internal mammary artery  graft.  This was performed after removal of the aortic cross-clamp.  The  aortic cross-clamp was removed after 69 minutes.  The heart began to beat  spontaneously without need for cardioversion.  The end-to-end anastomosis of  the short segment of interposition saphenous vein and the right internal  mammary artery conduit was constructed in an end-to-end fashion with 7-0  Prolene suture.  All proximal and distal  anastomoses were inspected for  hemostasis and appropriate graft orientation.  Epicardial pacing wires were  fixed to the right ventricular outflow tract and to the right atrial  appendage.  The patient was rewarmed to 37 degrees centigrade temperature.   The patient was weaned from cardiopulmonary bypass without difficulty.  The  patient's rhythm at separation from bypass was junctional rhythm.  AV  sequential pacing was employed.  Normal sinus rhythm resumed spontaneously  after separation from bypass.  No inotropic support is required.  Total  cardiopulmonary bypass time for the operation is 103 minutes.   The venous and arterial cannulae were removed uneventfully.  Protamine was  administered to reverse the anticoagulation.  The mediastinum and the right  chest were irrigated with saline solution containing vancomycin.  Meticulous  surgical hemostasis was ascertained.  The mediastinum and the right chest were drained with three chest tubes placed through separate stab incisions  inferiorly. The median sternotomy was closed in routine fashion.  The right  lower extremity incisions were closed in multiple layers in routine fashion.  All skin incisions were closed with  subcuticular skin closures.  The patient tolerated the procedure well and  was transported to the surgical intensive care unit in stable condition.  There were no intraoperative complications.  Sponge, needle and instrument  counts were verified correct at completion of the operation.  No blood  products were administered.                                                Salvatore Decent. Cornelius Moras, M.D.    CHO/MEDQ  D:  04/02/2003  T:  04/02/2003  Job:  [id number]   cc:   Jonelle Sidle, M.D. [facility]   Veneda Melter, M.D.   Doreen Beam  [address]  [place]  [place] [address]  Fax: [phone]

## 2011-04-22 NOTE — Cardiovascular Report (Signed)
NAME:  [name], [name]                         ACCOUNT NO.:  [id number]   MEDICAL RECORD NO.:  [id number]                   PATIENT TYPE:  OIB   LOCATION:  2899                                 FACILITY:  [facility]   PHYSICIAN:  Veneda Melter, M.D.                   DATE OF BIRTH:  [birth year]   DATE OF PROCEDURE:  08/07/2003  DATE OF DISCHARGE:  08/07/2003                              CARDIAC CATHETERIZATION   PROCEDURES PERFORMED:  1. Left heart catheterization.  2. Left ventriculogram.  3. Selective coronary angiography.  4. Selective angiography of saphenous vein graft and renal bypass grafts.   DIAGNOSES:  1. Native two vessel coronary artery disease.  2. Normal left ventricular systolic function.  3. Patent bypass grafts.   HISTORY:  [name] is a 60 year old gentleman with coronary artery  disease who has undergone surgical revascularization on April 02, 2003 after  failed attempt at PCI of the left circumflex artery.  At that time he had  saphenous vein graft to OM 2 with composite of saphenous vein and free RIMA  graft to the PDA of the right coronary artery which was occluded.  He has  done well in the interim.  However, recently he has had some shortness of  breath and chest discomfort.  Cardiolite study showed some lateral ischemia  and he is referred for further assessment.   TECHNIQUE:  Informed consent was obtained.  The patient was brought to the  catheterization laboratory.  A 6-French sheath placed in the right femoral  artery using modified Seldinger technique.  A 6-French JL4 and JR4 catheter  was then used to engage the left and right coronary arteries.  Selective  angiography performed in various projections using manual injections of  contrast.  A JR4 catheter was used to engage the saphenous vein graft to OM  2 and a multipurpose 2 catheter was used to engage the composite graft to  the PDA.  Subsequently, a 6-French pigtail catheter was then advanced left  ventricle and left ventriculogram performed using power injections of  contrast in two views.  At the termination of this case catheters and sheath  were removed and manual pressure applied until adequate hemostasis was  achieved.  The patient tolerated procedure well and was transferred to floor  in stable condition.   FINDINGS:  1. Left main trunk:  Large caliber vessel with distal narrowing of 30%.  2. LAD is a medium caliber vessel that provides bifurcating first diagonal     branch in proximal segment and then extends to the apex.  LAD has mild     disease of 30% in the proximal mid section.  The diagonal branch has     moderate narrowing of 40% in the proximal segment.  3. Left circumflex artery is a small caliber vessel that provides two     marginal branches in the mid section.  The  AV circumflex appears to have     an eccentric plaque of at least 60%.  There is then diffuse disease in     the mid AV segment of 80% extending into the bifurcation with the first     and second marginal branches.  The second marginal branch fills     predominantly via saphenous vein graft and is occluded after the     anastomosis.  4. Right coronary artery is dominant.  This is a medium caliber vessel.     Provides posterior descending artery and two posterior ventricular     branches in terminal segment.  The right coronary artery is 100% occluded     in the mid section.  The posterior descending artery fills via saphenous     vein graft with retrograde filling of the two posterior ventricular     branches.  There is moderate narrowing of 40-50% in the proximal     posterior descending artery.  5. Saphenous vein graft to OM 2 is patent.  6. Composite graft of saphenous vein and free RIMA to the PDA is patent.  7. LV:  Normal end-systolic and end-diastolic dimensions.  Overall left     ventricular function appears well preserved.  Ejection fraction     approximately 55%.  There is akinesis of the  small area in the     inferolateral wall.  No mitral regurgitation is appreciated.  LV pressure     is 140/10.  Aortic is 140/80.  LVEDP equals 18.   ASSESSMENT/PLAN:  [name] is a 60 year old gentleman with two vessel  coronary artery disease status post surgical revascularization.  He has  evidence of small vessel disease involving the mid AV circumflex and first  marginal branch.  This appears to be 2 mm at best and given difficult time  we had advancing equipment into the circumflex at his last intervention and  the small caliber of the vessel, will recommend medical therapy at this  point with aggressive control of his blood pressure and heart rate and  reassessment of his anginal symptoms.  Should he continue to have symptoms  that are poorly controlled, percutaneous intervention may be considered.                                               Veneda Melter, M.D.    Melton Alar  D:  08/07/2003  T:  08/08/2003  Job:  [id number]   cc:   [name], M.D. [facility]   [name]  [address]  [place]  [place] [address]  Fax: [phone]

## 2011-04-22 NOTE — Discharge Summary (Signed)
NAME:  [name]                         ACCOUNT NO.:  [id number]   MEDICAL RECORD NO.:  [id number]                   PATIENT TYPE:  INP   LOCATION:  2012                                 FACILITY:   PHYSICIAN:  Salvatore Decent. Cornelius Moras, M.D.              DATE OF BIRTH:  1951-01-18   DATE OF ADMISSION:  03/28/2003  DATE OF DISCHARGE:  04/05/2003                                 DISCHARGE SUMMARY   ADMISSION DIAGNOSIS:  Chest pain.   ADDITIONAL DISCHARGE DIAGNOSES:  1. Severe two-vessel coronary artery disease.  2. Unstable angina.  3. History of myocardial infarction in 2000.  4. Hypertension.  5. Hyperlipidemia.   PROCEDURES PERFORMED:  1. Coronary artery bypass grafting x2 (saphenous vein graft to the first     obtuse marginal, composite saphenous vein graft in right free right     internal mammary artery to the posterior descending coronary).  2. Coronary endarterectomy of circumflex marginal.  3. Endoscopic vein harvest right thigh.   HISTORY:  The patient is a 60 year old male with a history of coronary  artery disease.  He is status post myocardial infarction in 2000 and had  undergone coronary angiography in 2000 and in 2002.  He has had frequent  angina and had been previously followed by cardiologist in [place].  He was seen in the [facility] office by Dr.  Diona Browner on 03/25/03 with progressive anginal symptoms.  It was felt that he  should come to [facility] for cardiac catheterization and further  evaluation and treatment.  He was admitted on 4/23 and underwent cardiac  catheterization by Dr. Chales Abrahams.  He was found to have severe two-vessel  coronary artery disease which was not felt to be amenable to percutaneous  intervention.  Cardiothoracic surgery consultation was obtained and Dr. Cornelius Moras  agreed that the patient should proceed with surgical revascularization.  The  patient agreed to proceed, however, he wished to be  discharged home and to  return on a leave of absence for surgery.   HOSPITAL COURSE:  He was admitted on 4/28 and taken to the operating room  where he underwent coronary artery bypass graft x2 as described in detail  above.  He tolerated the procedure well and was transferred to the SICU in  stable condition.  Postoperatively, he has done well.  He was extubated  shortly after surgery.  He was hemodynamically stable and doing well on  postoperative day #1.  He was transferred to the floor late in the day on  postoperative day #1.  He has been ambulating in the halls without  difficulty.  He is weaned off supplemental oxygen and has been maintaining  O2 saturations of greater than 90% on room air.  He has been afebrile and  all vital signs have been stable.  He is tolerating a regular diet and  having normal bowel and bladder function.  He has been diuresed back down to  within five pounds of her preoperative weight.  He has been started on  Plavix for his coronary endarterectomy.  His  surgical  incision sites are  healing well.  It is felt that if he continues to do well he will be ready  for discharge home on 04/05/03.   DISCHARGE MEDICATIONS:  1. Enteric coated aspirin 325 mg daily.  2. Plavix 75 mg daily.  3. Atenolol 25 mg daily.  4. Arthrotec 50 mg daily.  5. Lipitor 20 mg daily.  6. Tylox one to two q.4h p.r.n. pain.   DISCHARGE INSTRUCTIONS:  He is to refrain from driving, heavy lifting or  strenuous activity.  He may continue daily walking and use of incentive  spirometer.  He is asked to shower daily and clean his incisions with soap  and water.  He is asked to continue low fat, low sodium diet.   DISCHARGE FOLLOWUP:  He will be scheduled to see Dr. Diona Browner in two weeks  and have a chest x-ray at that visit.  He is also asked to follow up with  Dr. Cornelius Moras on 5/24 at 12 p.m.  He will bring the chest x-ray to this  appointment for Dr. Orvan July review.  He is asked to call our  office if he  experiences any problems or has questions in the interim.     Coral Ceo, P.A.                        Salvatore Decent. Cornelius Moras, M.D.    GC/MEDQ  D:  04/04/2003  T:  04/05/2003  Job:  [id number]   cc:   Jonelle Sidle, M.D. [facility]

## 2011-04-22 NOTE — Discharge Summary (Signed)
NAME:  [name], [name]                         ACCOUNT NO.:  [id number]   MEDICAL RECORD NO.:  [id number]                   PATIENT TYPE:  OIB   LOCATION:  7001                                 FACILITY:  [facility]   PHYSICIAN:  Salvadore Farber, M.D.             DATE OF BIRTH:  [birth year]   DATE OF ADMISSION:  03/28/2003  DATE OF DISCHARGE:  03/30/2003                           DISCHARGE SUMMARY - REFERRING   BRIEF HISTORY:  This is a pleasant 60 year old male with known coronary  artery disease.  He has a history of a late-presentation inferior MI in  2000.  He had undergone coronary angiography on two occasions - once in 2000  and once in 2002.  This patient has frequent angina and had been followed by  a cardiologist in [place], [place].  He was seen in the [place]  office in [place] on March 25, 2003 by Dr. Diona Browner with progressive anginal  symptoms and arrangements were made to admit the patient to [facility] for further evaluation and treatment.  The patient did have a 2-D  echo performed March 25 that showed mild segmental left ventricular  dysfunction with an EF of approximately 50% with inferior hypokinesis.  There was also mild mitral and aortic regurgitation.   PAST MEDICAL HISTORY:  Please see cardiac history as noted above.  The  patient also has a history of hypertension, diabetes mellitus, history of  dyslipidemia, and he is status post cholecystectomy.   ALLERGIES:  No known drug allergies.   SOCIAL HISTORY:  The patient is married.  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  He denies alcohol or tobacco use.   FAMILY HISTORY:  Noncontributory.   HOSPITAL COURSE:  As noted, this patient was admitted to [facility]  on March 28, 2003 for evaluation of chest pain.  He underwent coronary  angiography on the day of admission performed by Dr. Chales Abrahams.  The patient was  found to have severe two-vessel coronary artery  disease with mild LV  dysfunction, EF estimated to be 50%.  The left main had a distal 20% lesion.  The LAD had a 20-30% lesion as well as a 50% large first diagonal.  The  circumflex had two obtuse marginals.  There was a 70% left circumflex after  the A-V segment.  There was a 100% occluded second obtuse marginal.  The RCA  was 100% occluded at the mid portion.  There was a PDA with two PV branches.  It was felt that coronary artery bypass graft surgery would be the best  option for this patient.   The patient was seen in consultation by Dr. Tressie Stalker on March 28, 2003  and a recommendation was made for coronary artery bypass graft surgery.   The patient had a 2-D echo performed on March 28, 2003.  This showed an  ejection fraction  of 50-55%.  The aortic valve was mildly to moderately  calcified consistent with trivial aortic stenosis.  There were moderate  fibrocalcific changes of the aortic root.  The left atrium was mildly  dilated.   Surgery was to be scheduled for Wednesday, April 28.  The patient requested  discharge prior to this to return for the surgery.  Dr. Cornelius Moras cautioned the  patient there is a possibility that he could have an MI before his return.  However, the patient was willing to accept the risks and arrangements were  made to discharge the patient in improved but guarded condition on March 30, 2003.   LABORATORY DATA:  A CBC on April 24 revealed hemoglobin 14.5; hematocrit  42.4; wbc's 10.2; platelets 201,000.  Chemistry profile on April 24 showed  BUN 13, creatinine 0.9, potassium 3.8, glucose 92.  Cardiac enzymes on April  24 showed CK 90, MB 4.8, troponin 0.19.  Lipid profile was pending.  Please  see results of echo as noted above.  Hemoglobin A1c was pending.   CURRENT MEDICATIONS:  1. Imdur 30 mg daily.  2. Tenormin 25 mg daily.  3. Lipitor as previously taken.  4. Aspirin 325 mg daily.  5. Nitroglycerin p.r.n. for chest pain.   ACTIVITY:  The  patient was told to avoid any strenuous activity.   SPECIAL INSTRUCTIONS:  He was told to return to the hospital or call the  [place] office if he had any significant chest discomfort.   DIET:  He was to stay on a low salt, low fat diet.   FOLLOW-UP:  1. He was to return on April 02, 2003 for his coronary artery bypass graft     surgery as instructed by Dr. Cornelius Moras.  2. He was to see Dr. Diona Browner following his surgery.  3. He was to follow up with his primary care physician as needed.   PROBLEM LIST AT TIME OF DISCHARGE:  1. Severe coronary artery disease with coronary artery bypass graft surgery     scheduled for April 02, 2003.  2. Mild left ventricular dysfunction - ejection fraction of approximately     50%.  3. History of hypertension.  4. History of diabetes.  5.     Dyslipidemia.  6. Status post cholecystectomy.  7. Mild valvular abnormalities.     Delton See, P.A. [facility]                  Salvadore Farber, M.D.    DR/MEDQ  D:  03/30/2003  T:  03/31/2003  Job:  [id number]   cc:   Salvatore Decent. Cornelius Moras, M.D.  [address]  [place]  [place] [address]  Fax: [phone]   Doreen Beam  [address]  [place]  [place] [address]  Fax: [phone]   Jonelle Sidle, M.D. [facility]

## 2011-10-11 ENCOUNTER — Encounter: Payer: Self-pay | Admitting: *Deleted

## 2011-10-13 ENCOUNTER — Encounter: Payer: Self-pay | Admitting: Cardiology

## 2011-10-14 ENCOUNTER — Telehealth: Payer: Self-pay | Admitting: Physician Assistant

## 2011-10-14 ENCOUNTER — Ambulatory Visit (INDEPENDENT_AMBULATORY_CARE_PROVIDER_SITE_OTHER): Payer: BC Managed Care – PPO | Admitting: Cardiology

## 2011-10-14 ENCOUNTER — Encounter: Payer: Self-pay | Admitting: Cardiology

## 2011-10-14 VITALS — BP 153/85 | HR 96 | Ht 68.0 in | Wt 219.0 lb

## 2011-10-14 DIAGNOSIS — I2510 Atherosclerotic heart disease of native coronary artery without angina pectoris: Secondary | ICD-10-CM

## 2011-10-14 DIAGNOSIS — I10 Essential (primary) hypertension: Secondary | ICD-10-CM

## 2011-10-14 DIAGNOSIS — E782 Mixed hyperlipidemia: Secondary | ICD-10-CM

## 2011-10-14 DIAGNOSIS — I4891 Unspecified atrial fibrillation: Secondary | ICD-10-CM

## 2011-10-14 DIAGNOSIS — I359 Nonrheumatic aortic valve disorder, unspecified: Secondary | ICD-10-CM

## 2011-10-14 MED ORDER — METOPROLOL SUCCINATE ER 25 MG PO TB24: 25.0000 mg | ORAL_TABLET | Freq: Every day | ORAL | Status: DC

## 2011-10-14 MED ORDER — WARFARIN SODIUM 5 MG PO TABS: 5.0000 mg | ORAL_TABLET | Freq: Every evening | ORAL | Status: DC

## 2011-10-14 MED ORDER — ASPIRIN EC 81 MG PO TBEC: 81.0000 mg | DELAYED_RELEASE_TABLET | Freq: Every day | ORAL | Status: DC

## 2011-10-14 NOTE — Assessment & Plan Note (Signed)
Blood pressure is elevated today. Continue medical therapy, recommend diet and sodium restriction.

## 2011-10-14 NOTE — Patient Instructions (Signed)
   Begin Toprol XL 25mg  daily  Begin Coumadin 5mg  every evening  Decrease Aspirin to 81mg  daily Follow up in  1 month - see above

## 2011-10-14 NOTE — Assessment & Plan Note (Signed)
Multivessel, relatively stable from symptom perspective. Continue medical therapy and observation. Had noninvasive ischemic workup earlier in the year.

## 2011-10-14 NOTE — Telephone Encounter (Signed)
Received outpatient phone call from [facility] - [facility] was unable to receive faxed prescription. I verified the patient's Toprol and Coumadin prescriptions as ordered in Dr. Ival Bible office note with same rx, quantity, and refills.

## 2011-10-14 NOTE — Assessment & Plan Note (Signed)
Continues on Lipitor. 

## 2011-10-14 NOTE — Assessment & Plan Note (Signed)
Overall moderate by echocardiogram.

## 2011-10-14 NOTE — Progress Notes (Signed)
Clinical Summary [name] is a 60 y.o.male presenting for followup. He was seen back in March. He reports no angina, although has had some episodes of shortness of breath. He was taken off of atenolol at some point since his last evaluation. He has not noted any palpitations specifically, although his ECG today shows coarse atrial fibrillation, duration uncertain. CHADS2 score is 2 this point.  Followup echocardiogram from March showed an LVEF of 55-60% with moderately dilated left atrium, overall moderate aortic valve stenosis with mean aortic gradient of 26 mm mercury, peak aortic valve velocity 3.4 m/s, mild mitral regurgitation. Medical therapy and clinical followup was recommended. Carotid Dopplers from that time revealed less than 50% stenosis of the RICA and LICA.  Today we discussed stroke prophylaxis with Coumadin, also possibility of considering a cardioversion attempt if this rhythm persists and is not paroxysmal.  No Known Allergies  Medication list reviewed.  Past Medical History  Diagnosis Date  . Essential hypertension, benign   . Mixed hyperlipidemia   . Coronary atherosclerosis of native coronary artery     2 vessel s/p CABG  . Myocardial infarction 2000  . Type 2 diabetes mellitus   . Aortic stenosis     Moderate    Past Surgical History  Procedure Date  . Coronary artery bypass graft 03/2003    Dr. Cornelius Moras - SVG to OM,composite SVG/RIMA to PDA  . Cholecystectomy     Social History [name] reports that he quit smoking about 28 years ago. His smoking use included Cigarettes. He has a 42 pack-year smoking history. He has never used smokeless tobacco. [name] reports that he does not drink alcohol.  Review of Systems As outlined above. No unusual bleeding problems. No syncope. Otherwise reviewed and negative.  Physical Examination Filed Vitals:   10/14/11 1544  BP: 153/85  Pulse: 96    Obese male in no acute distress.  HEENT: Conjunctiva and lids  normal, oropharynx with moist because it appeared  Neck: Supple, no elevated JVP, soft carotid bruits versus radiation of cardiac murmur. No thyromegaly.  Lungs: Clear to auscultation, nonlabored.  Cardiac: Irregularly irregular with 2-3/6 systolic murmur heard best at the base radiating toward the apex, no S3 gallop.  Abdomen: Soft, nontender, bowel sounds present, both adamantly.  Skin: Warm and dry.  Musculoskeletal: No kyphosis.  Extremities: No pitting edema, distal pulses 1-2+.  Neuropsychiatric: Alert and oriented x3, Affect appropriate.   ECG Reviewed in EMR.    Problem List and Plan

## 2011-10-14 NOTE — Assessment & Plan Note (Signed)
Newly diagnosed, duration uncertain. CHADS 2 score is 2 at this point. We discussed stroke prophylaxis with Coumadin, possibility that some of his symptoms may be actually related to increased heart rate with activity. Plan to initiate Toprol-XL 25 mg daily and enroll him in the Coumadin clinic, concurrent reduction and aspirin 81 mg daily. Pradaxa was not chosen in light of his valvular heart disease. Office followup arranged, may need to consider cardioversion attempt.

## 2011-10-21 ENCOUNTER — Telehealth: Payer: Self-pay | Admitting: *Deleted

## 2011-10-21 ENCOUNTER — Ambulatory Visit (INDEPENDENT_AMBULATORY_CARE_PROVIDER_SITE_OTHER): Payer: BC Managed Care – PPO | Admitting: *Deleted

## 2011-10-21 DIAGNOSIS — Z7901 Long term (current) use of anticoagulants: Secondary | ICD-10-CM | POA: Insufficient documentation

## 2011-10-21 DIAGNOSIS — I4891 Unspecified atrial fibrillation: Secondary | ICD-10-CM

## 2011-10-21 NOTE — Telephone Encounter (Signed)
Pt presents for CCR visit. During this visit he c/o increasing SOB with any exertion. He saw Dr. Sherril Croon this morning who thinks SOB is related to A.fib. Pt wanted to know when DCCV could be done. Notified pt the general rule-of-thumb is to be therapeutic on Coumadin for 4 wks prior to DCCV. This was week #1 therapeutic with Coumadin. HR is irregular today. BP 167/81, HR 75, Sat 97% on room air.  Pt wonders if medications could be causing SOB. He is aware to go to ER for continued worsening DOE. Pt verbalized understanding.

## 2011-10-21 NOTE — Telephone Encounter (Signed)
Medications were just recently modified, heart rate seem to be adequately controlled based on recorded rate of 75, also no oxygen desaturation. For now would continue present course and plan for elective cardioversion, unless his symptoms worsen.

## 2011-10-21 NOTE — Telephone Encounter (Signed)
Pt notified and verbalized understanding.

## 2011-10-28 ENCOUNTER — Ambulatory Visit (INDEPENDENT_AMBULATORY_CARE_PROVIDER_SITE_OTHER): Payer: BC Managed Care – PPO | Admitting: *Deleted

## 2011-10-28 DIAGNOSIS — I4891 Unspecified atrial fibrillation: Secondary | ICD-10-CM

## 2011-10-28 DIAGNOSIS — Z7901 Long term (current) use of anticoagulants: Secondary | ICD-10-CM

## 2011-10-28 LAB — POCT INR: INR: 5.2

## 2011-11-04 ENCOUNTER — Ambulatory Visit (INDEPENDENT_AMBULATORY_CARE_PROVIDER_SITE_OTHER): Payer: BC Managed Care – PPO | Admitting: *Deleted

## 2011-11-04 DIAGNOSIS — I4891 Unspecified atrial fibrillation: Secondary | ICD-10-CM

## 2011-11-04 DIAGNOSIS — Z7901 Long term (current) use of anticoagulants: Secondary | ICD-10-CM

## 2011-11-04 LAB — POCT INR: INR: 2.2

## 2011-11-11 ENCOUNTER — Encounter: Payer: Self-pay | Admitting: *Deleted

## 2011-11-11 ENCOUNTER — Telehealth: Payer: Self-pay | Admitting: *Deleted

## 2011-11-11 ENCOUNTER — Ambulatory Visit (INDEPENDENT_AMBULATORY_CARE_PROVIDER_SITE_OTHER): Payer: BC Managed Care – PPO | Admitting: *Deleted

## 2011-11-11 ENCOUNTER — Encounter: Payer: Self-pay | Admitting: Cardiology

## 2011-11-11 DIAGNOSIS — Z7901 Long term (current) use of anticoagulants: Secondary | ICD-10-CM

## 2011-11-11 DIAGNOSIS — I4891 Unspecified atrial fibrillation: Secondary | ICD-10-CM

## 2011-11-11 LAB — POCT INR: INR: 3.2

## 2011-11-11 NOTE — Telephone Encounter (Signed)
Per [name], pt is ready for cardioversion. Pt requested DCCV on 12/10 or 12/14.   Cardioversion scheduled for Friday, Dec 14 with Dr. Kirke Corin at [facility]. Pt will need to arrive at 0700 for 0900 procedure.   Left message at home and cell numbers for pt to return call regarding scheduling.

## 2011-11-11 NOTE — Telephone Encounter (Signed)
Pt returned call. Notified regarding appt info for DCCV and instructions. Notes fax[facility] to [facility]. Instruction sheet mailed to pt.

## 2011-11-16 ENCOUNTER — Telehealth: Payer: Self-pay | Admitting: *Deleted

## 2011-11-16 NOTE — Telephone Encounter (Signed)
No precert required 

## 2011-11-16 NOTE — Telephone Encounter (Signed)
DCCV at [facility] Nov 18, 2011 Dr. Kirke Corin to do for Dr. Diona Browner

## 2011-11-18 ENCOUNTER — Encounter: Payer: BC Managed Care – PPO | Admitting: *Deleted

## 2011-11-18 DIAGNOSIS — I4891 Unspecified atrial fibrillation: Secondary | ICD-10-CM

## 2011-11-18 HISTORY — PX: CARDIOVERSION: SHX1299

## 2011-11-18 LAB — PROTIME-INR

## 2011-11-25 ENCOUNTER — Ambulatory Visit (INDEPENDENT_AMBULATORY_CARE_PROVIDER_SITE_OTHER): Payer: BC Managed Care – PPO | Admitting: *Deleted

## 2011-11-25 ENCOUNTER — Telehealth: Payer: Self-pay | Admitting: *Deleted

## 2011-11-25 ENCOUNTER — Ambulatory Visit (INDEPENDENT_AMBULATORY_CARE_PROVIDER_SITE_OTHER): Payer: BC Managed Care – PPO | Admitting: Cardiology

## 2011-11-25 ENCOUNTER — Other Ambulatory Visit: Payer: Self-pay | Admitting: Cardiology

## 2011-11-25 ENCOUNTER — Encounter: Payer: Self-pay | Admitting: Cardiology

## 2011-11-25 VITALS — BP 153/74 | HR 89 | Ht 68.0 in | Wt 214.0 lb

## 2011-11-25 DIAGNOSIS — I359 Nonrheumatic aortic valve disorder, unspecified: Secondary | ICD-10-CM

## 2011-11-25 DIAGNOSIS — I4891 Unspecified atrial fibrillation: Secondary | ICD-10-CM

## 2011-11-25 DIAGNOSIS — Z7901 Long term (current) use of anticoagulants: Secondary | ICD-10-CM

## 2011-11-25 DIAGNOSIS — I2510 Atherosclerotic heart disease of native coronary artery without angina pectoris: Secondary | ICD-10-CM

## 2011-11-25 LAB — POCT INR: INR: 2.5

## 2011-11-25 MED ORDER — AMIODARONE HCL 200 MG PO TABS: ORAL_TABLET | ORAL | Status: DC

## 2011-11-25 NOTE — Telephone Encounter (Signed)
Pt notified of results and verbalized understanding.  Pt will keep appt for Coumadin clinic on 12/28. Pt is aware of importance of close INR management with Amiodarone.

## 2011-11-25 NOTE — Patient Instructions (Signed)
Follow up as scheduled. Your physician recommends that you continue on your current medications as directed. Please refer to the Current Medication list given to you today. Your physician has requested that you have an echocardiogram. Echocardiography is a painless test that uses sound waves to create images of your heart. It provides your doctor with information about the size and shape of your heart and how well your heart's chambers and valves are working. This procedure takes approximately one hour. There are no restrictions for this procedure. DO TODAY We will call you with further medication changes after Echo is reviewed.

## 2011-11-25 NOTE — Assessment & Plan Note (Signed)
Recurrent after recent cardioversion. Plan at this time is to followup 2-D echocardiogram in light of possible heart failure symptoms for reassessment of LVEF and degree of aortic stenosis. Depending on this, can then determine next step in antiarrhythmic therapy. Otherwise continue Coumadin. We may advance Toprol-XL dosing eventually.

## 2011-11-25 NOTE — Telephone Encounter (Signed)
Message copied by Arlyss Gandy on Fri Nov 25, 2011  4:02 PM ------      Message from: Jonelle Sidle      Created: Fri Nov 25, 2011  3:23 PM       Reviewed. LVEF still 55% with inferior wall motion abnormality consistent with scar (noted on cardiolite from 1/12). Aortic stenosis has worsened overall, moderate to severe with AVA approximately 1.0 cm2. I think this is most likely the culprit in terms of his dyspnea and CHF symptoms, perhaps also compounded by atrial fibrillation. For now would start Amiodarone 200 mg BID x 2 weeks, then down to once a day, continue current dose Toprol XL, and stay on Coumadin. He is also using Lasix PRN based on weight change. Would like to see him back over the next few weeks to discuss further evaluation. Expect he will need a heart catheterization to further assess AS and status of revascularization.

## 2011-11-25 NOTE — Assessment & Plan Note (Signed)
Overall mild to moderate by echocardiogram in March of this year.

## 2011-11-25 NOTE — Assessment & Plan Note (Signed)
No active angina symptoms. Status post CABG in 2004.

## 2011-11-25 NOTE — Progress Notes (Signed)
Clinical Summary [name] is a 60 y.o.male presenting for followup. He was seen in November. Medical therapy initiated including Toprol-XL and Coumadin in light of atrial fibrillation with CHADS2 score of 2. After adequate anticoagulation, he was referred for elective cardioversion on 12/14, done by Dr. Kirke Flowers, with successful restoration of sinus rhythm initially. It seems that within a few days he reverted to atrial fibrillation again, however does not feel any palpitations. He has had shortness of breath, reportedly was diagnosed with heart failure symptoms at an urgent care, given Lasix for diuresis, and has lost 10 pounds. He feels better, but not at baseline. He reports no chest pain. No peripheral edema.  Echocardiogram from March of this year, as reviewed in the prior note, showed probable mild to moderate aortic stenosis with normal LVEF.  Followup ECG today shows atrial fibrillation. Heart rate is under 100 on current dose of Toprol-XL.  Today we discussed reevaluation of his cardiac structure and function by repeat echocardiogram, also likelihood of initiating antiarrhythmic therapy. We talked about options, and he prefers to try something that can be initiated as an outpatient   No Known Allergies  Medication list reviewed.  Past Medical History  Diagnosis Date  . Essential hypertension, benign   . Mixed hyperlipidemia   . Coronary atherosclerosis of native coronary artery     2 vessel s/p CABG  . Myocardial infarction 2000  . Type 2 diabetes mellitus   . Aortic stenosis     Moderate  . Atrial fibrillation     Past Surgical History  Procedure Date  . Coronary artery bypass graft 03/2003    Dr. Cornelius Flowers - SVG to OM,composite SVG/RIMA to PDA  . Cholecystectomy     Social History [name] reports that he quit smoking about 28 years ago. His smoking use included Cigarettes. He has a 42 pack-year smoking history. He has never used smokeless tobacco. [name] reports  that he does not drink alcohol.  Review of Systems As outlined above, otherwise negative.  Physical Examination Filed Vitals:   11/25/11 1107  BP: 153/74  Pulse: 89    Obese male in no acute distress.  HEENT: Conjunctiva and lids normal, oropharynx with moist mucosa. Neck: Supple, no elevated JVP, soft carotid bruits versus radiation of cardiac murmur. No thyromegaly.  Lungs: Clear to auscultation, diminished, nonlabored.  Cardiac: Irregularly irregular rhythm with 2-3/6 systolic murmur heard best at the base radiating toward the apex, no S3 gallop.  Abdomen: Obese, bowel sounds present.  Skin: Warm and dry.  Musculoskeletal: No kyphosis.  Extremities: No pitting edema, distal pulses 1-2+.  Neuropsychiatric: Alert and oriented x3, Affect appropriate.   ECG Reviewed in EMR.    Problem List and Plan

## 2011-11-30 ENCOUNTER — Encounter: Payer: Self-pay | Admitting: Cardiology

## 2011-12-02 ENCOUNTER — Ambulatory Visit (INDEPENDENT_AMBULATORY_CARE_PROVIDER_SITE_OTHER): Payer: BC Managed Care – PPO | Admitting: *Deleted

## 2011-12-02 ENCOUNTER — Telehealth: Payer: Self-pay | Admitting: *Deleted

## 2011-12-02 DIAGNOSIS — I4891 Unspecified atrial fibrillation: Secondary | ICD-10-CM

## 2011-12-02 DIAGNOSIS — Z7901 Long term (current) use of anticoagulants: Secondary | ICD-10-CM

## 2011-12-02 MED ORDER — FUROSEMIDE 20 MG PO TABS: 20.0000 mg | ORAL_TABLET | ORAL | Status: DC | PRN

## 2011-12-02 NOTE — Telephone Encounter (Signed)
Pt asked to s/w nurse while here for Coumadin visit. Pt states at 3:30 am he woke up with a thud in his chest. He wonders if this was his heart going back in rhythm. Palpable pulse feels irregular to nurse.  Pt needs refill for Lasix (PRN) sent to  [facility] on [address] in [place]. Pt states he's had to take this for SOB. Reinforce to pt that he should only take Lasix based on increased weight not SOB as SOB could likely be caused by AV disease and/or A.fib. Pt verbalized understanding.

## 2011-12-09 ENCOUNTER — Ambulatory Visit (INDEPENDENT_AMBULATORY_CARE_PROVIDER_SITE_OTHER): Payer: BC Managed Care – PPO | Admitting: *Deleted

## 2011-12-09 DIAGNOSIS — I4891 Unspecified atrial fibrillation: Secondary | ICD-10-CM

## 2011-12-09 DIAGNOSIS — Z7901 Long term (current) use of anticoagulants: Secondary | ICD-10-CM

## 2011-12-16 ENCOUNTER — Telehealth: Payer: Self-pay | Admitting: *Deleted

## 2011-12-16 ENCOUNTER — Ambulatory Visit (INDEPENDENT_AMBULATORY_CARE_PROVIDER_SITE_OTHER): Payer: BC Managed Care – PPO | Admitting: Cardiology

## 2011-12-16 ENCOUNTER — Other Ambulatory Visit: Payer: Self-pay | Admitting: Cardiology

## 2011-12-16 ENCOUNTER — Encounter: Payer: Self-pay | Admitting: *Deleted

## 2011-12-16 ENCOUNTER — Ambulatory Visit (INDEPENDENT_AMBULATORY_CARE_PROVIDER_SITE_OTHER): Payer: BC Managed Care – PPO | Admitting: *Deleted

## 2011-12-16 ENCOUNTER — Encounter: Payer: Self-pay | Admitting: Cardiology

## 2011-12-16 VITALS — BP 149/77 | HR 61 | Ht 68.0 in | Wt 216.0 lb

## 2011-12-16 DIAGNOSIS — Z7901 Long term (current) use of anticoagulants: Secondary | ICD-10-CM

## 2011-12-16 DIAGNOSIS — I359 Nonrheumatic aortic valve disorder, unspecified: Secondary | ICD-10-CM

## 2011-12-16 DIAGNOSIS — I5033 Acute on chronic diastolic (congestive) heart failure: Secondary | ICD-10-CM

## 2011-12-16 DIAGNOSIS — I4891 Unspecified atrial fibrillation: Secondary | ICD-10-CM

## 2011-12-16 DIAGNOSIS — G4733 Obstructive sleep apnea (adult) (pediatric): Secondary | ICD-10-CM

## 2011-12-16 DIAGNOSIS — I10 Essential (primary) hypertension: Secondary | ICD-10-CM

## 2011-12-16 DIAGNOSIS — I2510 Atherosclerotic heart disease of native coronary artery without angina pectoris: Secondary | ICD-10-CM

## 2011-12-16 NOTE — Patient Instructions (Signed)
Follow up as scheduled with coumadin clinic and Dr. Diona Browner. Your physician recommends that you go to the [facility] for lab work: Your physician has requested that you have a cardiac catheterization. Cardiac catheterization is used to diagnose and/or treat various heart conditions. Doctors may recommend this procedure for a number of different reasons. The most common reason is to evaluate chest pain. Chest pain can be a symptom of coronary artery disease (CAD), and cardiac catheterization can show whether plaque is narrowing or blocking your heart's arteries. This procedure is also used to evaluate the valves, as well as measure the blood flow and oxygen levels in different parts of your heart. For further information please visit [email or web address] Please follow instruction sheet, as given.

## 2011-12-16 NOTE — Assessment & Plan Note (Signed)
Moderate to perhaps severe aortic stenosis, progressive over time, and potentially symptomatic as detailed above in the setting of atrial fibrillation that has been persistent as well as underlying ischemic heart disease. Recent LVEF in the normal range. We have discussed the matter, and plan is to proceed with a left and right heart catheterization with Dr. Kirke Corin for further hemodynamic evaluation, also reassessment of graft patency. He will be taken off Coumadin for the procedure.

## 2011-12-16 NOTE — Telephone Encounter (Signed)
Pt is scheduled for (L) and (R) heart cath on Jan 16 w/Dr. Kirke Corin per Dr. Diona Browner

## 2011-12-16 NOTE — Assessment & Plan Note (Signed)
Multivessel disease status post CABG. Cardiolite from last year showed inferior scar with no progressive ischemia. Graft patency will be reassessed at catheterization in light of his symptoms.

## 2011-12-16 NOTE — Progress Notes (Signed)
 Clinical Summary [name] is a 61 y.o.male presenting for followup. He was seen in December. He had recurrent atrial fibrillation following initially successful cardioversion. Subsequently he developed heart failure symptoms and was placed on diuretic after visit at an urgent care.  He was referred for a followup echocardiogram that demonstrated LVEF of 55-60% with inferior akinesis, and progressive aortic stenosis into the moderate to severe range with AVA of approximately 1.0 cm. He was placed on amiodarone with continuation of Toprol XL and Coumadin. Lasix was to be used as needed based on weight change.  He is here with his wife today. Still reports dyspnea on exertion, NYHA class 2-3, fatigue. No frank chest pain. Heart rate is well controlled today. Still appears to be atrial fibrillation.  Today we reviewed the findings of his echocardiogram as well as his symptoms. We discussed proceeding to a left and right heart catheterization to better evaluate his hemodynamics as it relates to degree of aortic stenosis, and also exclude any progressive graft disease that may not have been picked up by noninvasive imaging. It is not entirely clear to me that the patient's persistent atrial fibrillation explains all of his symptoms, but is certainly at least partially involved. We also discussed considering repeat cardioversion attempt on amiodarone, once further evaluating the above issues. After reviewing the risks and benefits, he is in agreement to proceed.  No Known Allergies  Current Outpatient Prescriptions  Medication Sig Dispense Refill  . amiodarone (PACERONE) 200 MG tablet Take 200 mg by mouth daily.      . amLODipine-atorvastatin (CADUET) 5-20 MG per tablet Take 1 tablet by mouth daily.        . aspirin EC 81 MG tablet Take 1 tablet (81 mg total) by mouth daily.      . etodolac (LODINE) 500 MG tablet Take 1 tablet by mouth Twice daily.      . furosemide (LASIX) 20 MG tablet Take 1  tablet (20 mg total) by mouth as needed.  30 tablet  1  . glyBURIDE-metformin (GLUCOVANCE) 2.5-500 MG per tablet Take 2 tablets by mouth 2 (two) times daily with a meal.       . isosorbide mononitrate (IMDUR) 30 MG 24 hr tablet Take 30 mg by mouth daily.        . levothyroxine (SYNTHROID, LEVOTHROID) 50 MCG tablet Take 50 mcg by mouth daily.        . metoprolol succinate (TOPROL XL) 25 MG 24 hr tablet Take 1 tablet (25 mg total) by mouth daily.  30 tablet  6  . nitroGLYCERIN (NITROSTAT) 0.4 MG SL tablet Place 0.4 mg under the tongue every 5 (five) minutes as needed.        . VOLTAREN 1 % GEL Apply 1 application topically 4 times daily.      . warfarin (COUMADIN) 5 MG tablet Take 1 tablet (5 mg total) by mouth every evening.  30 tablet  3    Past Medical History  Diagnosis Date  . Essential hypertension, benign   . Mixed hyperlipidemia   . Coronary atherosclerosis of native coronary artery     2 vessel s/p CABG  . Myocardial infarction 2000  . Type 2 diabetes mellitus   . Aortic stenosis     Moderate  . Atrial fibrillation     Past Surgical History  Procedure Date  . Coronary artery bypass graft 03/2003    Dr. Owen - SVG to OM, composite SVG/RIMA to PDA  . Cholecystectomy       History reviewed. No pertinent family history.  Social History [name] reports that he quit smoking about 29 years ago. His smoking use included Cigarettes. He has a 42 pack-year smoking history. He has never used smokeless tobacco. [name] reports that he does not drink alcohol.  Review of Systems Negative except as outlined above.  Physical Examination Filed Vitals:   12/16/11 0859  BP: 149/77  Pulse: 61    Obese male in no acute distress.  HEENT: Conjunctiva and lids normal, oropharynx with moist mucosa.  Neck: Supple, no elevated JVP, soft carotid bruits versus radiation of cardiac murmur. No thyromegaly.  Lungs: Clear to auscultation, diminished, nonlabored.  Cardiac: Irregularly  irregular rhythm with 3/6 systolic murmur heard best at the base radiating toward the apex, no S3 gallop.  Abdomen: Obese, bowel sounds present.  Skin: Warm and dry.  Musculoskeletal: No kyphosis.  Extremities: No pitting edema, distal pulses 1-2+.  Neuropsychiatric: Alert and oriented x3, Affect appropriate.   ECG Reviewed in EMR.   Problem List and Plan   

## 2011-12-16 NOTE — Assessment & Plan Note (Signed)
Persistent following recent cardioversion attempt that was briefly successful. Patient being taken off Coumadin for his catheterization, otherwise will continue with current medications including amiodarone. We may be able to pursue a repeat cardioversion attempt depending on the results of his catheterization.

## 2011-12-16 NOTE — Assessment & Plan Note (Signed)
No changes to current regimen

## 2011-12-16 NOTE — Assessment & Plan Note (Signed)
Reports compliance with CPAP.

## 2011-12-16 NOTE — Telephone Encounter (Signed)
Pt has [employer], no precert required. °

## 2011-12-21 ENCOUNTER — Other Ambulatory Visit: Payer: Self-pay

## 2011-12-21 ENCOUNTER — Encounter (HOSPITAL_BASED_OUTPATIENT_CLINIC_OR_DEPARTMENT_OTHER)
Admission: RE | Disposition: A | Discharge status: Hospice | Payer: Self-pay | Source: Ambulatory Visit | Attending: Cardiovascular Disease

## 2011-12-21 ENCOUNTER — Ambulatory Visit (HOSPITAL_COMMUNITY): Admit: 2011-12-21 | Payer: Self-pay | Admitting: Cardiovascular Disease

## 2011-12-21 ENCOUNTER — Inpatient Hospital Stay (HOSPITAL_BASED_OUTPATIENT_CLINIC_OR_DEPARTMENT_OTHER)
Admission: RE | Admit: 2011-12-21 | Discharge: 2011-12-21 | Disposition: A | Discharge status: Hospice | Payer: BC Managed Care – PPO | Source: Ambulatory Visit | Attending: Cardiovascular Disease | Admitting: Cardiovascular Disease

## 2011-12-21 ENCOUNTER — Ambulatory Visit (HOSPITAL_COMMUNITY)
Admission: RE | Admit: 2011-12-21 | Discharge: 2011-12-21 | Disposition: A | Discharge status: Home / Self Care | Payer: BC Managed Care – PPO | Source: Ambulatory Visit | Attending: Cardiovascular Disease | Admitting: Cardiovascular Disease

## 2011-12-21 ENCOUNTER — Encounter (HOSPITAL_COMMUNITY)
Admission: RE | Disposition: A | Discharge status: Home / Self Care | Payer: Self-pay | Source: Ambulatory Visit | Attending: Cardiovascular Disease

## 2011-12-21 DIAGNOSIS — I2510 Atherosclerotic heart disease of native coronary artery without angina pectoris: Secondary | ICD-10-CM | POA: Insufficient documentation

## 2011-12-21 DIAGNOSIS — I359 Nonrheumatic aortic valve disorder, unspecified: Secondary | ICD-10-CM | POA: Insufficient documentation

## 2011-12-21 DIAGNOSIS — Z951 Presence of aortocoronary bypass graft: Secondary | ICD-10-CM | POA: Insufficient documentation

## 2011-12-21 DIAGNOSIS — R0989 Other specified symptoms and signs involving the circulatory and respiratory systems: Secondary | ICD-10-CM | POA: Insufficient documentation

## 2011-12-21 DIAGNOSIS — R0609 Other forms of dyspnea: Secondary | ICD-10-CM | POA: Insufficient documentation

## 2011-12-21 DIAGNOSIS — I252 Old myocardial infarction: Secondary | ICD-10-CM | POA: Insufficient documentation

## 2011-12-21 DIAGNOSIS — I2789 Other specified pulmonary heart diseases: Secondary | ICD-10-CM | POA: Insufficient documentation

## 2011-12-21 HISTORY — PX: CARDIAC CATHETERIZATION: SHX172

## 2011-12-21 HISTORY — PX: LEFT AND RIGHT HEART CATHETERIZATION WITH CORONARY ANGIOGRAM: SHX5449

## 2011-12-21 LAB — POCT ACTIVATED CLOTTING TIME
Activated Clotting Time: 177 seconds
Activated Clotting Time: 248 seconds

## 2011-12-21 LAB — POCT I-STAT GLUCOSE
Glucose, Bld: 131 mg/dL — ABNORMAL HIGH (ref 70–99)
Operator id: 194801

## 2011-12-21 LAB — POCT I-STAT 3, ART BLOOD GAS (G3+)
Acid-base deficit: 2 mmol/L (ref 0.0–2.0)
O2 Saturation: 91 %

## 2011-12-21 LAB — GLUCOSE, CAPILLARY: Glucose-Capillary: 116 mg/dL — ABNORMAL HIGH (ref 70–99)

## 2011-12-21 LAB — POCT I-STAT 3, VENOUS BLOOD GAS (G3P V)
Acid-base deficit: 2 mmol/L (ref 0.0–2.0)
Bicarbonate: 24.1 mEq/L — ABNORMAL HIGH (ref 20.0–24.0)
pCO2, Ven: 42.6 mmHg — ABNORMAL LOW (ref 45.0–50.0)
pH, Ven: 7.35 — ABNORMAL HIGH (ref 7.250–7.300)
pO2, Ven: 32 mmHg (ref 30.0–45.0)

## 2011-12-21 SURGERY — JV LEFT AND RIGHT HEART CATHETERIZATION WITH CORONARY ANGIOGRAM
Anesthesia: Moderate Sedation

## 2011-12-21 SURGERY — LEFT AND RIGHT HEART CATHETERIZATION WITH CORONARY ANGIOGRAM
Anesthesia: LOCAL

## 2011-12-21 SURGERY — LEFT AND RIGHT HEART CATHETERIZATION WITH CORONARY/GRAFT ANGIOGRAM
Anesthesia: LOCAL

## 2011-12-21 MED ORDER — ONDANSETRON HCL 4 MG/2ML IJ SOLN: 4.0000 mg | Freq: Four times a day (QID) | INTRAMUSCULAR | Status: DC | PRN

## 2011-12-21 MED ORDER — ACETAMINOPHEN 325 MG PO TABS: 650.0000 mg | ORAL_TABLET | ORAL | Status: DC | PRN

## 2011-12-21 MED ORDER — ASPIRIN 81 MG PO CHEW
324.0000 mg | CHEWABLE_TABLET | ORAL | Status: AC
  Administered 2011-12-21: 324 mg via ORAL

## 2011-12-21 MED ORDER — DIAZEPAM 5 MG PO TABS
5.0000 mg | ORAL_TABLET | ORAL | Status: AC
  Administered 2011-12-21: 5 mg via ORAL

## 2011-12-21 MED ORDER — SODIUM CHLORIDE 0.9 % IV SOLN
INTRAVENOUS | Status: DC
  Administered 2011-12-21: 10:00:00 via INTRAVENOUS

## 2011-12-21 MED ORDER — HEPARIN SODIUM (PORCINE) 1000 UNIT/ML IJ SOLN
INTRAMUSCULAR | Status: AC
  Filled 2011-12-21: qty 1

## 2011-12-21 MED ORDER — SODIUM CHLORIDE 0.9 % IV SOLN: INTRAVENOUS | Status: DC

## 2011-12-21 NOTE — Interval H&P Note (Signed)
History and Physical Interval Note:  12/21/2011 10:24 AM  [name]  has presented today for surgery, with the diagnosis of cp  The various methods of treatment have been discussed with the patient. After consideration of risks, benefits and other options for treatment, the patient has consented to  Procedure(s): JV LEFT AND RIGHT HEART CATHETERIZATION WITH CORONARY ANGIOGRAM as a surgical intervention .  The patients' history has been reviewed, patient examined, no change in status, stable for surgery.  I have reviewed the patients' chart and labs.  Questions were answered to the patient's satisfaction.     Lorine Bears

## 2011-12-21 NOTE — Op Note (Addendum)
Cardiac Catheterization Procedure Note  Name: [name] MRN: [id number] DOB: July 28, 1951  Procedure: Right Heart Cath, Left Heart Cath, Selective Coronary Angiography, LV angiography, pressure wire interrogation of the left anterior descending artery.  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 He is known to have aortic stenosis and was recently diagnosed with atrial fibrillation. He has been having significant symptoms of dyspnea at rest and with minimal activities. He underwent cardioversion for atrial fibrillation but did not maintain sinus rhythm for a long time. He had an echocardiogram done which showed moderate to severe aortic stenosis with a valve area close to 1 cm. He was referred for right and left cardiac catheterization for further evaluation.   Procedural Details: The right groin was prepped, draped, and anesthetized with 1% lidocaine. Using the modified Seldinger technique a 5 French sheath was placed in the right femoral artery and a 7 French sheath was placed in the right femoral vein. A Swan-Ganz catheter was used for the right heart catheterization. Standard protocol was followed for recording of right heart pressures and sampling of oxygen saturations. Fick cardiac output was calculated. Standard Judkins catheters were used for selective coronary angiography and left ventriculography. There were no immediate procedural complications. The patient was transferred to the post catheterization recovery area for further monitoring.  Procedural Findings: Hemodynamics RA 14 mmHg RV 60/7 mmHg PA 59/21 mmHg PCWP 28 mmHg LV 152/12 mmHg . LVEDP: 24 mmHg AO 125/67 mmHg  Oxygen saturations: PA 59% AO 91%  Cardiac Output (Fick) : 4.74  Cardiac Index (Fick):  2.25   Aortic Valve: Peak to Peak gradient: 27 mmHg , Mean gradient: 36 mmHg, Valve area: 1. 02  Pulmonary  vascular resistance (PVR): 1.9 Woods units.   Coronary angiography: Coronary dominance: Right   Left Main:  Mildly calcified without significant disease.  Left Anterior Descending (LAD):  The vessel is normal in size and moderately calcified especially in the proximal and mid segment. In the proximal segment there is a 50% stenosis at the diagonal branch. Shortly after that there is another lesion of 60-70% stenosis. The rest of the LAD has minor irregularities without evidence of obstructive disease. There are collaterals from the septal branch to the distal RCA.  1st diagonal (D1):  Large in size and gives 3 medium size branches. There is 70-80% proximal disease. This is a bifurcation stenosis with the left anterior descending artery.  2nd diagonal (D2):  Very small in size  3rd diagonal (D3):  Very small in size.  Circumflex (LCx):  Small in size and nondominant. There is mild diffuse disease in the proximal and midsegment. The vessel is occluded in the midsegment.  1st obtuse marginal:  Very small in size without significant disease.  Right Coronary Artery: Normal in size and dominant. It is occluded proximally.  SVG to OM 3: Patent with mild disease only.  SVG to right PDA: Is patent with mild 20% disease proximally otherwise no evidence of obstructive disease. Left ventriculography: Left ventricular systolic function is moderately reduced , LVEF is estimated at 35-40 %, there is no significant mitral regurgitation . There is severe inferior wall hypokinesis. The left ventricle is mildly dilated.  Pressure wire interrogation of the left anterior descending artery: I decided to proceed with pressure wire interrogation of the left anterior descending artery. The patient was given 4000 units of unfractionated heparin. ACT was 248. A Volcano pressure wire was  used in the standard fashion. IV adenosine was started at a dose of 140 mcg per kilogram per minute. FFR at rest was 0.87 and with  adenosine infusion was 0.71 indicating physiologic significance of the stenosis. The wire was then removed as well as the guiding catheter. The sheath was removed and manual pressure was applied.  Final Conclusions:   1. moderately elevated filling pressures with mild to moderate pulmonary hypertension.(PVR: 1.9 Woods units). Mildly reduced cardiac output. 2. Moderately reduced LV systolic function with an estimated ejection fraction of 35-40%. 3. Significant three-vessel coronary artery disease with patent SVG to OM and SVG to RCA. 4. Significant disease in the proximal left anterior descending artery as well as a large first diagonal. FFR ratio in the left anterior descending artery was 0.71. This is a bifurcation stenosis that involves both left anterior descending artery and the ostium of a large diagonal branch. 5. Moderate to severe aortic stenosis with a mean gradient of 36 mm Hg and aortic valve area of 1.02.   Recommendations: The aortic stenosis is not critical but is approaching the threshold of surgical intervention. There is a significant disease in the proximal left anterior descending artery involving the bifurcation of a large diagonal branch which is not ideal location for percutaneous coronary intervention. Thus, I think the best approach is to proceed with aortic valve replacement as well as coronary artery bypass graft surgery of the LAD and the diagonal. Also consideration should be given to a Maze procedure for atrial fibrillation.  Lorine Bears MD, [facility] 12/21/2011, 12:12 PM

## 2011-12-21 NOTE — H&P (View-Only) (Signed)
Clinical Summary [name] is a 61 y.o.male presenting for followup. He was seen in December. He had recurrent atrial fibrillation following initially successful cardioversion. Subsequently he developed heart failure symptoms and was placed on diuretic after visit at an urgent care.  He was referred for a followup echocardiogram that demonstrated LVEF of 55-60% with inferior akinesis, and progressive aortic stenosis into the moderate to severe range with AVA of approximately 1.0 cm. He was placed on amiodarone with continuation of Toprol XL and Coumadin. Lasix was to be used as needed based on weight change.  He is here with his wife today. Still reports dyspnea on exertion, NYHA class 2-3, fatigue. No frank chest pain. Heart rate is well controlled today. Still appears to be atrial fibrillation.  Today we reviewed the findings of his echocardiogram as well as his symptoms. We discussed proceeding to a left and right heart catheterization to better evaluate his hemodynamics as it relates to degree of aortic stenosis, and also exclude any progressive graft disease that may not have been picked up by noninvasive imaging. It is not entirely clear to me that the patient's persistent atrial fibrillation explains all of his symptoms, but is certainly at least partially involved. We also discussed considering repeat cardioversion attempt on amiodarone, once further evaluating the above issues. After reviewing the risks and benefits, he is in agreement to proceed.  No Known Allergies  Current Outpatient Prescriptions  Medication Sig Dispense Refill  . amiodarone (PACERONE) 200 MG tablet Take 200 mg by mouth daily.      Marland Kitchen amLODipine-atorvastatin (CADUET) 5-20 MG per tablet Take 1 tablet by mouth daily.        Marland Kitchen aspirin EC 81 MG tablet Take 1 tablet (81 mg total) by mouth daily.      Marland Kitchen etodolac (LODINE) 500 MG tablet Take 1 tablet by mouth Twice daily.      . furosemide (LASIX) 20 MG tablet Take 1  tablet (20 mg total) by mouth as needed.  30 tablet  1  . glyBURIDE-metformin (GLUCOVANCE) 2.5-500 MG per tablet Take 2 tablets by mouth 2 (two) times daily with a meal.       . isosorbide mononitrate (IMDUR) 30 MG 24 hr tablet Take 30 mg by mouth daily.        Marland Kitchen levothyroxine (SYNTHROID, LEVOTHROID) 50 MCG tablet Take 50 mcg by mouth daily.        . metoprolol succinate (TOPROL XL) 25 MG 24 hr tablet Take 1 tablet (25 mg total) by mouth daily.  30 tablet  6  . nitroGLYCERIN (NITROSTAT) 0.4 MG SL tablet Place 0.4 mg under the tongue every 5 (five) minutes as needed.        . VOLTAREN 1 % GEL Apply 1 application topically 4 times daily.      Marland Kitchen warfarin (COUMADIN) 5 MG tablet Take 1 tablet (5 mg total) by mouth every evening.  30 tablet  3    Past Medical History  Diagnosis Date  . Essential hypertension, benign   . Mixed hyperlipidemia   . Coronary atherosclerosis of native coronary artery     2 vessel s/p CABG  . Myocardial infarction 2000  . Type 2 diabetes mellitus   . Aortic stenosis     Moderate  . Atrial fibrillation     Past Surgical History  Procedure Date  . Coronary artery bypass graft 03/2003    Dr. Cornelius Moras - SVG to OM, composite SVG/RIMA to PDA  . Cholecystectomy  History reviewed. No pertinent family history.  Social History [name] reports that he quit smoking about 29 years ago. His smoking use included Cigarettes. He has a 42 pack-year smoking history. He has never used smokeless tobacco. [name] reports that he does not drink alcohol.  Review of Systems Negative except as outlined above.  Physical Examination Filed Vitals:   12/16/11 0859  BP: 149/77  Pulse: 61    Obese male in no acute distress.  HEENT: Conjunctiva and lids normal, oropharynx with moist mucosa.  Neck: Supple, no elevated JVP, soft carotid bruits versus radiation of cardiac murmur. No thyromegaly.  Lungs: Clear to auscultation, diminished, nonlabored.  Cardiac: Irregularly  irregular rhythm with 3/6 systolic murmur heard best at the base radiating toward the apex, no S3 gallop.  Abdomen: Obese, bowel sounds present.  Skin: Warm and dry.  Musculoskeletal: No kyphosis.  Extremities: No pitting edema, distal pulses 1-2+.  Neuropsychiatric: Alert and oriented x3, Affect appropriate.   ECG Reviewed in EMR.   Problem List and Plan

## 2011-12-22 ENCOUNTER — Telehealth: Payer: Self-pay | Admitting: *Deleted

## 2011-12-22 DIAGNOSIS — R0602 Shortness of breath: Secondary | ICD-10-CM

## 2011-12-22 NOTE — Telephone Encounter (Signed)
Pt left message on voicemail asking for a return call. He states he is seeing Dr. Cornelius Moras on Wed next week. He wanted to know if he should still see Dr. Diona Browner on Monday.   Spoke with pt, he was notified he does not have an appt with Dr. Diona Browner until 01/06/12. He states he was put on his Lasix every day instead of PRN b/c he had some fluid on his lungs. Pt is aware to have BMET drawn a few days before appt on 2/1. Pt verbalized understanding.

## 2011-12-28 ENCOUNTER — Encounter: Payer: BC Managed Care – PPO | Admitting: Thoracic Surgery (Cardiothoracic Vascular Surgery)

## 2011-12-29 ENCOUNTER — Ambulatory Visit: Payer: BC Managed Care – PPO | Admitting: Cardiology

## 2011-12-29 ENCOUNTER — Telehealth: Payer: Self-pay | Admitting: *Deleted

## 2011-12-29 NOTE — Telephone Encounter (Signed)
Would like return phone call. / tg  °

## 2011-12-29 NOTE — Telephone Encounter (Signed)
Pt scheduled for INR appt tomorrow.  Also has an appt with Dr Cornelius Moras tomorrow to be scheduled for AVR and CABG.  INR appt moved to 01/03/12 per pt request.

## 2011-12-30 ENCOUNTER — Ambulatory Visit: Payer: BC Managed Care – PPO | Admitting: Cardiology

## 2011-12-30 ENCOUNTER — Institutional Professional Consult (permissible substitution) (INDEPENDENT_AMBULATORY_CARE_PROVIDER_SITE_OTHER): Payer: BC Managed Care – PPO | Admitting: Thoracic Surgery (Cardiothoracic Vascular Surgery)

## 2011-12-30 ENCOUNTER — Encounter: Payer: Self-pay | Admitting: Thoracic Surgery (Cardiothoracic Vascular Surgery)

## 2011-12-30 ENCOUNTER — Encounter: Payer: BC Managed Care – PPO | Admitting: *Deleted

## 2011-12-30 VITALS — BP 123/76 | HR 60 | Temp 97.4°F | Resp 16 | Ht 68.0 in | Wt 210.0 lb

## 2011-12-30 DIAGNOSIS — I2510 Atherosclerotic heart disease of native coronary artery without angina pectoris: Secondary | ICD-10-CM

## 2011-12-30 DIAGNOSIS — Z951 Presence of aortocoronary bypass graft: Secondary | ICD-10-CM

## 2011-12-30 DIAGNOSIS — I350 Nonrheumatic aortic (valve) stenosis: Secondary | ICD-10-CM

## 2011-12-30 DIAGNOSIS — I4891 Unspecified atrial fibrillation: Secondary | ICD-10-CM

## 2011-12-30 DIAGNOSIS — I359 Nonrheumatic aortic valve disorder, unspecified: Secondary | ICD-10-CM

## 2011-12-30 NOTE — Progress Notes (Signed)
[address]            [name] [address]          [phone]     CARDIOTHORACIC SURGERY CONSULTATION REPORT  PCP is VYAS,DHRUV B., MD, MD Referring Provider is MCDOWELL, Illene Bolus, MD  Chief Complaint  Patient presents with  . Aortic Stenosis    NEW PATIENT  CATH 12/21/11   CABG 2004      HPI:  Patient is a 61 year old male from [place], [place] with known history of coronary artery disease and aortic stenosis followed by Dr. Diona Browner.  The patient suffered inferior wall myocardial infarction in 2000 and later underwent coronary artery bypass grafting x2 in 2004 for severe two-vessel coronary artery disease status post failed attempted percutaneous coronary intervention.  He recovered from coronary artery bypass surgery uneventfully and has done well until recently. He has long-standing history of a heart murmur and was noted to have sclerotic aortic valve at the time of this surgery in 2004. He has subsequently developed aortic stenosis that has been followed on serial echocardiograms. An echo performed in March 2012 reportedly demonstrated a mean aortic gradient of 26 mm mercury with peak velocity across the valve of 3.4 m/s. Left ventricular systolic function remained nearly normal with ejection fraction 55-60% at that time. He was followed with medical therapy.  In October of 2012 the patient developed fairly significant exertional shortness of breath which has progressed over the last several months. He was found to be in persistent atrial fibrillation. He underwent attempted DC cardioversion in December but returned to atrial fibrillation shortly thereafter. His symptoms of exertional shortness of breath have persisted and remain quite significant. The patient therefore underwent left and right heart catheterization 12/21/2011. This confirmed the presence of at least moderate aortic stenosis with mean transvalvular gradient measured 36 mm mercury and  valve area estimated 1.02 cm. There was moderate pulmonary hypertension with PA pressures measured 59/21 point A wedge pressure of 28. There was also a progression of native coronary artery disease with 70% stenosis of the proximal left anterior ascending coronary artery and 70-80% stenosis of the first diagonal branch. The vein graft to the obtuse marginal branch is patent although the terminal portion of this vessel is chronically occluded. The composite vein and right internal mammary artery graft to the posterior descending coronary artery is patent.  There is at least moderate left ventricular dysfunction with ejection fraction estimated 35-40%. There is severe inferior wall hypokinesis and mild left ventricular chamber enlargement. FloWire interrogation of the left anterior descending coronary artery was performed confirming that the disease in this segment of vessel is hemodynamically significant. The patient has been referred to consider elective surgical intervention.    Past Medical History  Diagnosis Date  . Essential hypertension, benign   . Mixed hyperlipidemia   . Coronary atherosclerosis of native coronary artery     2 vessel s/p CABG  . Myocardial infarction 2000  . Type 2 diabetes mellitus   . Aortic stenosis     Moderate  . Atrial fibrillation     Past Surgical History  Procedure Date  . Coronary artery bypass graft 04/02/2003    Dr. Cornelius Moras - SVG to OM, composite SVG/RIMA to PDA  . Cholecystectomy   . Cardioversion 11/18/2011  . Coronary angioplasty 03/29/2003    Dr. Chales Abrahams - complicated by coronary dissection  History reviewed. No pertinent family history.  Social History History  Substance Use Topics  . Smoking status: Former Smoker -- 3.0 packs/day for 14 years    Types: Cigarettes    Quit date: 12/05/1982  . Smokeless tobacco: Never Used  . Alcohol Use: No    Current Outpatient Prescriptions  Medication Sig Dispense Refill  . amiodarone (PACERONE) 200 MG  tablet Take 200 mg by mouth daily.      Marland Kitchen amLODipine-atorvastatin (CADUET) 5-20 MG per tablet Take 1 tablet by mouth daily.        Marland Kitchen aspirin EC 81 MG tablet Take 1 tablet (81 mg total) by mouth daily.      Marland Kitchen etodolac (LODINE) 500 MG tablet Take 1 tablet by mouth Twice daily.      . furosemide (LASIX) 20 MG tablet Take 1 tablet (20 mg total) by mouth as needed.  30 tablet  1  . glyBURIDE-metformin (GLUCOVANCE) 2.5-500 MG per tablet Take 2 tablets by mouth 2 (two) times daily with a meal.       . isosorbide mononitrate (IMDUR) 30 MG 24 hr tablet Take 30 mg by mouth daily.        Marland Kitchen levothyroxine (SYNTHROID, LEVOTHROID) 50 MCG tablet Take 50 mcg by mouth daily.        . metoprolol succinate (TOPROL XL) 25 MG 24 hr tablet Take 1 tablet (25 mg total) by mouth daily.  30 tablet  6  . nitroGLYCERIN (NITROSTAT) 0.4 MG SL tablet Place 0.4 mg under the tongue every 5 (five) minutes as needed.        . VOLTAREN 1 % GEL Apply 1 application topically 4 times daily.      Marland Kitchen warfarin (COUMADIN) 5 MG tablet Take 1 tablet (5 mg total) by mouth every evening.  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, chronic   Vascular:  no pain suggestive of claudication   Neuro:   no symptoms suggestive of TIA's, no seizures, no headaches, no peripheral neuropathy   Endocrine:  Checks CBGs sporadically, most recent Hgb a1c > 7  HEENT:  no loose teeth or painful teeth,  no  recent vision changes  Psych:   no anxiety, no depression    Physical Exam:   BP 123/76  Pulse 60  Temp 97.4 F (36.3 C)  Resp 16  Ht 5\' 8"  (1.727 m)  Wt 210 lb (95.255 kg)  BMI 31.93 kg/m2  SpO2 95%  General:    well-appearing  HEENT:  Unremarkable   Neck:   no JVD, no bruits, no adenopathy   Chest:   clear to auscultation, symmetrical breath sounds, no wheezes, no rhonchi , well healed sternotomy scar  CV:   RRR, grade III/VI systolic murmur   Abdomen:  soft, non-tender, no masses   Extremities:  warm, well-perfused, pulses diminished but palpable, old scar from [facility] right thigh  Rectal/GU  Deferred  Neuro:   Grossly non-focal and symmetrical throughout  Skin:   Clean and dry, no rashes, no breakdown   Diagnostic Tests:  Cardiac Catheterization Procedure Note  Name: EHAN FREAS  MRN: [id number]  DOB: 1950/12/11  Procedure: Right Heart Cath, Left Heart Cath, Selective Coronary Angiography, LV angiography, pressure wire interrogation of the left anterior descending artery.  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 He is known to have aortic stenosis and was recently diagnosed with atrial fibrillation. He has been having significant symptoms of dyspnea at rest and with minimal activities. He underwent cardioversion for atrial fibrillation but did not maintain sinus rhythm for a long time. He had an echocardiogram done which showed moderate to severe aortic stenosis with a valve area close to 1 cm. He was referred for right and left cardiac catheterization for further evaluation.  Procedural Details: The right groin was prepped, draped, and anesthetized with 1% lidocaine. Using the modified Seldinger technique a 5 French sheath was placed in the right femoral artery and a 7 French sheath was placed in the right femoral vein. A Swan-Ganz catheter was used for the right heart  catheterization. Standard protocol was followed for recording of right heart pressures and sampling of oxygen saturations. Fick cardiac output was calculated. Standard Judkins catheters were used for selective coronary angiography and left ventriculography. There were no immediate procedural complications. The patient was transferred to the post catheterization recovery area for further monitoring.  Procedural Findings:  Hemodynamics  RA 14 mmHg  RV 60/7 mmHg  PA 59/21 mmHg  PCWP 28 mmHg  LV 152/12 mmHg . LVEDP: 24 mmHg  AO 125/67 mmHg  Oxygen saturations:  PA 59%  AO 91%  Cardiac Output (Fick) : 4.74  Cardiac Index (Fick): 2.25  Aortic Valve: Peak to Peak gradient: 27 mmHg , Mean gradient: 36 mmHg, Valve area: 1. 02  Pulmonary vascular resistance (PVR): 1.9 Woods units.  Coronary angiography:  Coronary dominance: Right  Left Main: Mildly calcified without significant disease.  Left Anterior Descending (LAD): The vessel is normal in size and moderately calcified especially in the proximal and mid segment. In the proximal segment there is a 50% stenosis at the diagonal branch. Shortly after that there is another lesion of 60-70% stenosis. The rest of the LAD has minor irregularities without evidence of obstructive disease. There are collaterals from the septal branch to the distal RCA.  1st diagonal (D1): Large in size and gives 3 medium size branches. There is 70-80% proximal disease. This is a bifurcation stenosis with the left anterior descending artery.  2nd diagonal (D2): Very small in size  3rd diagonal (D3): Very small in size.  Circumflex (LCx): Small in size and nondominant. There is mild diffuse disease in the proximal and midsegment. The vessel is occluded in the midsegment.  1st obtuse marginal: Very small in size without significant disease.  Right Coronary Artery: Normal in size and dominant. It is occluded proximally.  SVG to OM 3: Patent with mild disease only.  SVG to right  PDA: Is patent with mild 20% disease proximally otherwise no evidence of obstructive disease. Left ventriculography: Left ventricular systolic function is moderately reduced , LVEF is estimated at 35-40 %, there is no significant mitral regurgitation . There is severe inferior wall hypokinesis. The left ventricle is mildly dilated.  Pressure wire interrogation of the left anterior descending artery: I decided  to proceed with pressure wire interrogation of the left anterior descending artery. The patient was given 4000 units of unfractionated heparin. ACT was 248. A Volcano pressure wire was used in the standard fashion. IV adenosine was started at a dose of 140 mcg per kilogram per minute. FFR at rest was 0.87 and with adenosine infusion was 0.71 indicating physiologic significance of the stenosis. The wire was then removed as well as the guiding catheter. The sheath was removed and manual pressure was applied.  Final Conclusions:  1. moderately elevated filling pressures with mild to moderate pulmonary hypertension.(PVR: 1.9 Woods units). Mildly reduced cardiac output.  2. Moderately reduced LV systolic function with an estimated ejection fraction of 35-40%.  3. Significant three-vessel coronary artery disease with patent SVG to OM and SVG to RCA.  4. Significant disease in the proximal left anterior descending artery as well as a large first diagonal. FFR ratio in the left anterior descending artery was 0.71. This is a bifurcation stenosis that involves both left anterior descending artery and the ostium of a large diagonal branch.  5. Moderate to severe aortic stenosis with a mean gradient of 36 mm Hg and aortic valve area of 1.02.    Impression:  At least moderate aortic stenosis with underlying moderate left ventricular systolic dysfunction and three-vessel coronary artery disease status post coronary artery bypass grafting x2 in 2004. Grafts place at the time of the patient's original surgery  remained patent the patient has had progression of native artery artery disease tickle involving the left anterior descending coronary artery territory. Most recently the patient has developed persistent atrial fibrillation that has failed an attempted DC cardioversion and has been associated with significant acceleration of symptoms of congestive heart failure, now functional class II to class III. I agree that he would probably best be treated with surgical intervention. Risks of surgery will be considerable but reasonable under the circumstances and associated with the best long-term prognosis.   Plan:  I discussed options at length with the patient here in the office today. The rationale for surgical intervention has been discussed and contrasted with prognosis with continued medical therapy. We discussed a variety of surgical alternatives including aortic valve replacement, redo coronary artery bypass grafting, and Maze procedure. We discussed whether or not to replace his aortic valve using a mechanical prosthesis or a bioprosthetic tissue valve. The relative risks and benefits of each approach have been compared and contrasted and also put into the context of the patient's diagnosis of atrial fibrillation. The patient requests under the circumstances to have his valve replaced using a mechanical prosthesis to avoid the potential for late structural valve deterioration in failure. He understands that this will, with the need for lifelong anticoagulation.  The patient understands and accepts all potential associated risks of surgery including but not limited to risk of death, stroke, myocardial infarction, congestive heart failure, respiratory failure, renal failure, bleeding requiring blood transfusion and/or reexploration, arrhythmia, heart block or bradycardia requiring permanent pacemaker, pneumonia, pleural effusion, wound infection, pulmonary embolus or other thromboembolic complication, chronic pain  or other delayed complications.  All questions answered.  We plan to proceed with surgery on February 7. The patient will stop taking Coumadin on January 31 in anticipation of his upcoming surgery. We will see him back here in the office on February 4 for followup prior to surgery.     Salvatore Decent. Cornelius Moras, MD 12/30/2011 4:26 PM

## 2012-01-02 ENCOUNTER — Other Ambulatory Visit: Payer: Self-pay | Admitting: Thoracic Surgery (Cardiothoracic Vascular Surgery)

## 2012-01-02 ENCOUNTER — Telehealth: Payer: Self-pay | Admitting: *Deleted

## 2012-01-02 ENCOUNTER — Other Ambulatory Visit: Payer: Self-pay

## 2012-01-02 DIAGNOSIS — I359 Nonrheumatic aortic valve disorder, unspecified: Secondary | ICD-10-CM

## 2012-01-02 DIAGNOSIS — I2510 Atherosclerotic heart disease of native coronary artery without angina pectoris: Secondary | ICD-10-CM

## 2012-01-02 DIAGNOSIS — I4891 Unspecified atrial fibrillation: Secondary | ICD-10-CM

## 2012-01-02 NOTE — Telephone Encounter (Signed)
Pt was seen by Dr Cornelius Moras and AVR is scheduled for 01/12/12.  He told pt to stop coumadin on 01/05/12.  INR appt cancelled for 1/29 and pt will be seen back after D/C from hospital.

## 2012-01-02 NOTE — Telephone Encounter (Signed)
Patient wants to speak with regarding his Coumadin. / tg

## 2012-01-03 ENCOUNTER — Encounter: Payer: BC Managed Care – PPO | Admitting: *Deleted

## 2012-01-04 ENCOUNTER — Encounter (HOSPITAL_COMMUNITY): Payer: Self-pay | Admitting: Pharmacy Technician

## 2012-01-06 ENCOUNTER — Ambulatory Visit: Payer: BC Managed Care – PPO | Admitting: Cardiology

## 2012-01-09 ENCOUNTER — Encounter (HOSPITAL_COMMUNITY): Payer: Self-pay

## 2012-01-09 ENCOUNTER — Encounter: Payer: Self-pay | Admitting: Thoracic Surgery (Cardiothoracic Vascular Surgery)

## 2012-01-09 ENCOUNTER — Inpatient Hospital Stay (HOSPITAL_COMMUNITY)
Admission: RE | Admit: 2012-01-09 | Discharge: 2012-01-09 | Disposition: A | Discharge status: Home / Self Care | Payer: BC Managed Care – PPO | Source: Ambulatory Visit | Attending: Thoracic Surgery (Cardiothoracic Vascular Surgery) | Admitting: Thoracic Surgery (Cardiothoracic Vascular Surgery)

## 2012-01-09 ENCOUNTER — Other Ambulatory Visit (HOSPITAL_COMMUNITY): Payer: Self-pay | Admitting: Radiology

## 2012-01-09 ENCOUNTER — Other Ambulatory Visit: Payer: Self-pay

## 2012-01-09 ENCOUNTER — Encounter (HOSPITAL_COMMUNITY)
Admission: RE | Admit: 2012-01-09 | Discharge: 2012-01-09 | Disposition: A | Discharge status: Home / Self Care | Payer: BC Managed Care – PPO | Source: Ambulatory Visit | Attending: Thoracic Surgery (Cardiothoracic Vascular Surgery) | Admitting: Thoracic Surgery (Cardiothoracic Vascular Surgery)

## 2012-01-09 ENCOUNTER — Ambulatory Visit (INDEPENDENT_AMBULATORY_CARE_PROVIDER_SITE_OTHER): Payer: BC Managed Care – PPO | Admitting: Thoracic Surgery (Cardiothoracic Vascular Surgery)

## 2012-01-09 ENCOUNTER — Ambulatory Visit (HOSPITAL_COMMUNITY)
Admission: RE | Admit: 2012-01-09 | Discharge: 2012-01-09 | Disposition: A | Discharge status: Home / Self Care | Payer: BC Managed Care – PPO | Source: Ambulatory Visit | Attending: Thoracic Surgery (Cardiothoracic Vascular Surgery) | Admitting: Thoracic Surgery (Cardiothoracic Vascular Surgery)

## 2012-01-09 ENCOUNTER — Ambulatory Visit (HOSPITAL_COMMUNITY): Payer: BC Managed Care – PPO

## 2012-01-09 VITALS — BP 144/82 | HR 56 | Resp 18 | Ht 68.0 in | Wt 214.0 lb

## 2012-01-09 DIAGNOSIS — I350 Nonrheumatic aortic (valve) stenosis: Secondary | ICD-10-CM | POA: Insufficient documentation

## 2012-01-09 DIAGNOSIS — I4891 Unspecified atrial fibrillation: Secondary | ICD-10-CM

## 2012-01-09 DIAGNOSIS — Z01812 Encounter for preprocedural laboratory examination: Secondary | ICD-10-CM | POA: Insufficient documentation

## 2012-01-09 DIAGNOSIS — I359 Nonrheumatic aortic valve disorder, unspecified: Secondary | ICD-10-CM

## 2012-01-09 DIAGNOSIS — Z01818 Encounter for other preprocedural examination: Secondary | ICD-10-CM | POA: Insufficient documentation

## 2012-01-09 DIAGNOSIS — I2510 Atherosclerotic heart disease of native coronary artery without angina pectoris: Secondary | ICD-10-CM

## 2012-01-09 DIAGNOSIS — Z01810 Encounter for preprocedural cardiovascular examination: Secondary | ICD-10-CM | POA: Insufficient documentation

## 2012-01-09 HISTORY — DX: Hypothyroidism, unspecified: E03.9

## 2012-01-09 HISTORY — DX: Pneumonia, unspecified organism: J18.9

## 2012-01-09 HISTORY — DX: Personal history of other diseases of the digestive system: Z87.19

## 2012-01-09 HISTORY — DX: Unspecified rotator cuff tear or rupture of unspecified shoulder, not specified as traumatic: M75.100

## 2012-01-09 HISTORY — DX: Heart failure, unspecified: I50.9

## 2012-01-09 HISTORY — DX: Sleep apnea, unspecified: G47.30

## 2012-01-09 LAB — HEMOGLOBIN A1C
Hgb A1c MFr Bld: 6.9 % — ABNORMAL HIGH (ref <5.7)
Mean Plasma Glucose: 151 mg/dL — ABNORMAL HIGH (ref <117)

## 2012-01-09 LAB — BLOOD GAS, ARTERIAL
Acid-base deficit: 2.5 mmol/L — ABNORMAL HIGH (ref 0.0–2.0)
Drawn by: 181601
O2 Saturation: 97.3 %
pO2, Arterial: 91.8 mmHg (ref 80.0–100.0)

## 2012-01-09 LAB — URINALYSIS, ROUTINE W REFLEX MICROSCOPIC
Glucose, UA: NEGATIVE mg/dL
Leukocytes, UA: NEGATIVE
Nitrite: NEGATIVE
Protein, ur: NEGATIVE mg/dL
pH: 5.5 (ref 5.0–8.0)

## 2012-01-09 LAB — CBC
MCH: 30.8 pg (ref 26.0–34.0)
MCV: 91.2 fL (ref 78.0–100.0)
Platelets: 222 10*3/uL (ref 150–400)
RBC: 4.64 MIL/uL (ref 4.22–5.81)

## 2012-01-09 LAB — COMPREHENSIVE METABOLIC PANEL
CO2: 20 mEq/L (ref 19–32)
Calcium: 9.9 mg/dL (ref 8.4–10.5)
Creatinine, Ser: 0.68 mg/dL (ref 0.50–1.35)
GFR calc Af Amer: >90 mL/min (ref >90)
GFR calc non Af Amer: >90 mL/min (ref >90)
Glucose, Bld: 86 mg/dL (ref 70–99)

## 2012-01-09 LAB — SURGICAL PCR SCREEN: MRSA, PCR: NEGATIVE

## 2012-01-09 LAB — APTT: aPTT: 32 seconds (ref 24–37)

## 2012-01-09 LAB — PROTIME-INR: Prothrombin Time: 15.4 seconds — ABNORMAL HIGH (ref 11.6–15.2)

## 2012-01-09 MED ORDER — ALBUTEROL SULFATE (5 MG/ML) 0.5% IN NEBU
2.5000 mg | INHALATION_SOLUTION | Freq: Once | RESPIRATORY_TRACT | Status: AC
  Administered 2012-01-09: 2.5 mg via RESPIRATORY_TRACT

## 2012-01-09 NOTE — Progress Notes (Signed)
  Echocardiogram 2D Echocardiogram has been performed.  Shayaan Parke F 01/09/2012, 1:29 PM

## 2012-01-09 NOTE — Progress Notes (Signed)
PRELIMINARY PRELIMINARY  Pre-op Cardiac Surgery  Carotid Findings:  Bilaterally no significant ICA stenosis with antegrade vertebral flow.  Upper Extremity Right Left  Brachial Pressures 137 144  Radial Waveforms Tri Tri  Ulnar Waveforms Tri Tri  Palmar Arch (Allen's Test) Waveform decreases >50% with radial compression and remains normal with ulnar compression Waveform decreases >50% with radial compression and remains normal with ulnar compression      Lower  Extremity Right Left  Dorsalis Pedis    Anterior Tibial 166, Tri 154, Tri  Posterior Tibial 178, Tri 184, Tri  Ankle/Brachial Indices 1.24 1.28    Findings:  Normal ABI study  [name], RDMS

## 2012-01-09 NOTE — Progress Notes (Signed)
[address]            [place] [address]          [phone]     CARDIOTHORACIC SURGERY OFFICE NOTE  Referring Provider is Jonelle Sidle, MD PCP is Ignatius Specking., MD, MD   HPI:  Patient returns for follow-up of aortic stenosis and coronary artery disease with tentatively plans to proceed with redo coronary artery bypass grafting and aortic valve replacement on February 7. He was originally seen in consultation on 12/30/2011. Since then the patient reports no new problems or complaints. He has stable exertional shortness of breath. He has not had chest pain. The remainder of his review of systems is unchanged from previously.   Current Outpatient Prescriptions  Medication Sig Dispense Refill  . amiodarone (PACERONE) 200 MG tablet Take 200 mg by mouth daily.      Marland Kitchen amLODipine-atorvastatin (CADUET) 5-20 MG per tablet Take 1 tablet by mouth daily.       Marland Kitchen glyBURIDE-metformin (GLUCOVANCE) 2.5-500 MG per tablet Take 2 tablets by mouth 2 (two) times daily with a meal.       . isosorbide mononitrate (IMDUR) 30 MG 24 hr tablet Take 30 mg by mouth daily.       Marland Kitchen levothyroxine (SYNTHROID, LEVOTHROID) 50 MCG tablet Take 50 mcg by mouth daily.       . metoprolol succinate (TOPROL-XL) 25 MG 24 hr tablet Take 25 mg by mouth daily.      . nitroGLYCERIN (NITROSTAT) 0.4 MG SL tablet Place 0.4 mg under the tongue every 5 (five) minutes as needed. For chest pain      . aspirin EC 81 MG tablet Take 81 mg by mouth daily.      Marland Kitchen etodolac (LODINE) 500 MG tablet Take 500 mg by mouth 2 (two) times daily.       . furosemide (LASIX) 20 MG tablet Take 20 mg by mouth daily.      . VOLTAREN 1 % GEL Apply 1 application topically 4 (four) times daily as needed. For pain      . warfarin (COUMADIN) 5 MG tablet Take 2.5-10 mg by mouth every evening. 2 tablets (10mg ) Monday and Friday; 0.5 tablet (2.5mg ) the rest of the week       No current facility-administered medications for  this visit.   Facility-Administered Medications Ordered in Other Visits  Medication Dose Route Frequency Provider Last Rate Last Dose  . albuterol (PROVENTIL) (5 MG/ML) 0.5% nebulizer solution 2.5 mg  2.5 mg Nebulization Once Purcell Nails, MD   2.5 mg at 01/09/12 1409      Physical Exam:   BP 144/82  Pulse 56  Resp 18  Ht 5\' 8"  (1.727 m)  Wt 214 lb (97.07 kg)  BMI 32.54 kg/m2  SpO2 93%  General:  Well-appearing  Chest:   Clear to auscultation  CV:   Regular rate and rhythm with prominent grade 3/6 systolic murmur  Abdomen:  Soft and nontender  Extremities:  Warm and well-perfused  Diagnostic Tests:  Routine preoperative laboratory tests performed earlier today are reviewed in all within normal limits with exception of slightly elevated white blood count 11,800. The patient's chest x-ray is clear and urinalysis is normal.    Impression:  Severe three-vessel coronary artery disease status post coronary artery bypass grafting in 2004. The patient has at least moderate aortic stenosis  as well as recurrent persistent atrial fibrillation. Patient has developed accelerating symptoms of exertional shortness of breath.  Routine preoperative lab work are notable for slightly elevated white blood count although the patient denies any symptoms suggestive of underlying infection in both chest x-ray and urinalysis are normal.  Plan:  We plan to proceed with redo coronary artery bypass grafting and aortic valve replacement on Thursday, February 7 as originally scheduled. We will check a repeat white blood count on the morning of surgery and consider postponing the if there are signs or symptoms suggestive of infection or further elevation in the white blood count. I spent in excess of 25 minutes again reviewing the indications, risks, and potential benefits of surgery. All of his questions been addressed.   Salvatore Decent. Cornelius Moras, MD 01/09/2012 5:44 PM

## 2012-01-09 NOTE — Pre-Procedure Instructions (Signed)
[address]Y  01/09/2012   Your procedure is scheduled on:  Thursday,Feb 7  Report to [facility] at 0530 AM.  Call this number if you have problems the morning of surgery: [phone]   Remember:   Do not eat food:After Midnight.  May have clear liquids: up to 4 Hours before arrival.  Clear liquids include soda, tea, black coffee, apple or grape juice, broth.  Take these medicines the morning of surgery with A SIP OF WATER: *Isosorbide,Synthroid,*Metoprolol*   Do not wear jewelry, make-up or nail polish.  Do not wear lotions, powders, or perfumes. You may wear deodorant.  Do not shave 48 hours prior to surgery.  Do not bring valuables to the hospital.  Contacts, dentures or bridgework may not be worn into surgery.  Leave suitcase in the car. After surgery it may be brought to your room.  For patients admitted to the hospital, checkout time is 11:00 AM the day of discharge.   Patients discharged the day of surgery will not be allowed to drive home.  Name and phone number of your driver: *n/a**  Special Instructions: Incentive Spirometry - Practice and bring it with you on the day of surgery.CHG (Hibiclens Shower) 1/2 bottle night before surgery and the morning of surgery.applied to skin as directed.   Please read over the following fact sheets that you were given: Pain Booklet, Coughing and Deep Breathing, Blood Transfusion Information, Open Heart Packet, MRSA Information and Surgical Site Infection Prevention

## 2012-01-09 NOTE — Patient Instructions (Signed)
Take Toprol and Synthroid on the morning of surgery with sip water, otherwise nothing past midnight the night before

## 2012-01-10 ENCOUNTER — Other Ambulatory Visit: Payer: Self-pay

## 2012-01-10 DIAGNOSIS — I359 Nonrheumatic aortic valve disorder, unspecified: Secondary | ICD-10-CM

## 2012-01-10 DIAGNOSIS — I2510 Atherosclerotic heart disease of native coronary artery without angina pectoris: Secondary | ICD-10-CM

## 2012-01-11 MED ORDER — PHENYLEPHRINE HCL 10 MG/ML IJ SOLN
30.0000 ug/min | INTRAVENOUS | Status: AC
  Administered 2012-01-12: 40 ug/min via INTRAVENOUS
  Filled 2012-01-11: qty 2

## 2012-01-11 MED ORDER — DEXTROSE 5 % IV SOLN
1.5000 g | INTRAVENOUS | Status: AC
  Administered 2012-01-12: 1.5 g via INTRAVENOUS
  Filled 2012-01-11: qty 1.5

## 2012-01-11 MED ORDER — DEXMEDETOMIDINE HCL 100 MCG/ML IV SOLN
0.1000 ug/kg/h | INTRAVENOUS | Status: AC
  Administered 2012-01-12: .2 ug/kg/h via INTRAVENOUS
  Administered 2012-01-12: 0.7 ug/kg/h via INTRAVENOUS
  Filled 2012-01-11: qty 4

## 2012-01-11 MED ORDER — DEXTROSE 5 % IV SOLN
750.0000 mg | INTRAVENOUS | Status: DC
  Filled 2012-01-11: qty 750

## 2012-01-11 MED ORDER — SODIUM CHLORIDE 0.9 % IV SOLN
INTRAVENOUS | Status: AC
  Administered 2012-01-12: 1 [IU]/h via INTRAVENOUS
  Filled 2012-01-11: qty 1

## 2012-01-11 MED ORDER — DEXTROSE 5 % IV SOLN
0.5000 ug/min | INTRAVENOUS | Status: DC
  Filled 2012-01-11: qty 4

## 2012-01-11 MED ORDER — VERAPAMIL HCL 2.5 MG/ML IV SOLN
INTRAVENOUS | Status: AC
  Administered 2012-01-12: 11:00:00
  Filled 2012-01-11: qty 2.5

## 2012-01-11 MED ORDER — POTASSIUM CHLORIDE 2 MEQ/ML IV SOLN
80.0000 meq | INTRAVENOUS | Status: DC
  Filled 2012-01-11: qty 40

## 2012-01-11 MED ORDER — METOPROLOL TARTRATE 12.5 MG HALF TABLET: 12.5000 mg | ORAL_TABLET | Freq: Once | ORAL | Status: DC

## 2012-01-11 MED ORDER — VANCOMYCIN HCL 1000 MG IV SOLR
1500.0000 mg | INTRAVENOUS | Status: AC
  Administered 2012-01-12: 1500 mg via INTRAVENOUS
  Filled 2012-01-11: qty 1500

## 2012-01-11 MED ORDER — NITROGLYCERIN IN D5W 200-5 MCG/ML-% IV SOLN
2.0000 ug/min | INTRAVENOUS | Status: AC
  Administered 2012-01-12: 10 ug/min via INTRAVENOUS
  Filled 2012-01-11: qty 250

## 2012-01-11 MED ORDER — DOPAMINE-DEXTROSE 3.2-5 MG/ML-% IV SOLN
2.0000 ug/kg/min | INTRAVENOUS | Status: DC
  Filled 2012-01-11: qty 250

## 2012-01-11 MED ORDER — MAGNESIUM SULFATE 50 % IJ SOLN
40.0000 meq | INTRAMUSCULAR | Status: DC
  Filled 2012-01-11: qty 10

## 2012-01-11 MED ORDER — SODIUM CHLORIDE 0.9 % IV SOLN
INTRAVENOUS | Status: AC
  Administered 2012-01-12: 60 mL/h via INTRAVENOUS
  Filled 2012-01-11: qty 40

## 2012-01-11 NOTE — H&P (Signed)
CARDIOTHORACIC SURGERY HISTORY AND PHYSICAL EXAM   PCP is VYAS,DHRUV B., MD, MD Referring Provider is MCDOWELL, Illene Bolus, MD    Chief Complaint   Patient presents with   .  Aortic Stenosis       NEW PATIENT  CATH 12/21/11   CABG 2004       HPI:  Patient is a 61 year old male from [place], [place] with known history of coronary artery disease and aortic stenosis followed by Dr. Diona Browner.  The patient suffered inferior wall myocardial infarction in 2000 and later underwent coronary artery bypass grafting x2 in 2004 for severe two-vessel coronary artery disease status post failed attempted percutaneous coronary intervention.  He recovered from coronary artery bypass surgery uneventfully and has done well until recently. He has long-standing history of a heart murmur and was noted to have sclerotic aortic valve at the time of this surgery in 2004. He has subsequently developed aortic stenosis that has been followed on serial echocardiograms. An echo performed in March 2012 reportedly demonstrated a mean aortic gradient of 26 mm mercury with peak velocity across the valve of 3.4 m/s. Left ventricular systolic function remained nearly normal with ejection fraction 55-60% at that time. He was followed with medical therapy.  In October of 2012 the patient developed fairly significant exertional shortness of breath which has progressed over the last several months. He was found to be in persistent atrial fibrillation. He underwent attempted DC cardioversion in December but returned to atrial fibrillation shortly thereafter. His symptoms of exertional shortness of breath have persisted and remain quite significant. The patient therefore underwent left and right heart catheterization 12/21/2011. This confirmed the presence of at least moderate aortic stenosis with mean transvalvular gradient measured 36 mm mercury and valve area estimated 1.02 cm. There was moderate pulmonary hypertension with PA  pressures measured 59/21 point A wedge pressure of 28. There was also a progression of native coronary artery disease with 70% stenosis of the proximal left anterior ascending coronary artery and 70-80% stenosis of the first diagonal branch. The vein graft to the obtuse marginal branch is patent although the terminal portion of this vessel is chronically occluded. The composite vein and right internal mammary artery graft to the posterior descending coronary artery is patent.  There is at least moderate left ventricular dysfunction with ejection fraction estimated 35-40%. There is severe inferior wall hypokinesis and mild left ventricular chamber enlargement. FloWire interrogation of the left anterior descending coronary artery was performed confirming that the disease in this segment of vessel is hemodynamically significant. The patient has been referred to consider elective surgical intervention.      Past Medical History   Diagnosis  Date   .  Essential hypertension, benign     .  Mixed hyperlipidemia     .  Coronary atherosclerosis of native coronary artery         2 vessel s/p CABG   .  Myocardial infarction  2000   .  Type 2 diabetes mellitus     .  Aortic stenosis         Moderate   .  Atrial fibrillation         Past Surgical History   Procedure  Date   .  Coronary artery bypass graft  04/02/2003       Dr. Cornelius Moras - SVG to OM, composite SVG/RIMA to PDA   .  Cholecystectomy     .  Cardioversion  11/18/2011   .  Coronary angioplasty  03/29/2003       Dr. Chales Abrahams - complicated by coronary dissection     History reviewed. No pertinent family history.  Social History History   Substance Use Topics   .  Smoking status:  Former Smoker -- 3.0 packs/day for 14 years       Types:  Cigarettes       Quit date:  12/05/1982   .  Smokeless tobacco:  Never Used   .  Alcohol Use:  No       Current Outpatient Prescriptions   Medication  Sig  Dispense  Refill   .  amiodarone (PACERONE) 200 MG  tablet  Take 200 mg by mouth daily.         Marland Kitchen  amLODipine-atorvastatin (CADUET) 5-20 MG per tablet  Take 1 tablet by mouth daily.           Marland Kitchen  aspirin EC 81 MG tablet  Take 1 tablet (81 mg total) by mouth daily.         Marland Kitchen  etodolac (LODINE) 500 MG tablet  Take 1 tablet by mouth Twice daily.         .  furosemide (LASIX) 20 MG tablet  Take 1 tablet (20 mg total) by mouth as needed.   30 tablet   1   .  glyBURIDE-metformin (GLUCOVANCE) 2.5-500 MG per tablet  Take 2 tablets by mouth 2 (two) times daily with a meal.          .  isosorbide mononitrate (IMDUR) 30 MG 24 hr tablet  Take 30 mg by mouth daily.           Marland Kitchen  levothyroxine (SYNTHROID, LEVOTHROID) 50 MCG tablet  Take 50 mcg by mouth daily.           .  metoprolol succinate (TOPROL XL) 25 MG 24 hr tablet  Take 1 tablet (25 mg total) by mouth daily.   30 tablet   6   .  nitroGLYCERIN (NITROSTAT) 0.4 MG SL tablet  Place 0.4 mg under the tongue every 5 (five) minutes as needed.           .  VOLTAREN 1 % GEL  Apply 1 application topically 4 times daily.         Marland Kitchen  warfarin (COUMADIN) 5 MG tablet  Take 1 tablet (5 mg total) by mouth every evening.   30 tablet   3     No Known Allergies  Review of Systems:             General:                      normal appetite, normal energy               Respiratory:                no cough, no wheezing, no hemoptysis, no pain with inspiration or cough, + shortness of breath with mild activity               Cardiac:                       + occasional chest pain or tightness, + exertional SOB, no resting SOB, no PND, no orthopnea, no LE edema, no palpitations, no syncope but occasional dizzy spell             GI:  no difficulty swallowing, no hematochezia, no hematemesis, no melena, no constipation, no diarrhea               GU:                              no dysuria, no urgency, no frequency               Musculoskeletal:         + 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, chronic                Vascular:                     no pain suggestive of claudication               Neuro:                         no symptoms suggestive of TIA's, no seizures, no headaches, no peripheral neuropathy               Endocrine:                   Checks CBGs sporadically, most recent Hgb a1c > 7             HEENT:                       no loose teeth or painful teeth,  no recent vision changes             Psych:                         no anxiety, no depression                Physical Exam:              BP 123/76  Pulse 60  Temp 97.4 F (36.3 C)  Resp 16  Ht 5\' 8"  (1.727 m)  Wt 210 lb (95.255 kg)  BMI 31.93 kg/m2  SpO2 95%             General:                        well-appearing             HEENT:                       Unremarkable               Neck:                           no JVD, no bruits, no adenopathy               Chest:                         clear to auscultation, symmetrical breath sounds, no wheezes, no rhonchi , well healed sternotomy scar             CV:                              RRR, grade III/VI systolic murmur  Abdomen:                    soft, non-tender, no masses               Extremities:                 warm, well-perfused, pulses diminished but palpable, old scar from [facility] right thigh             Rectal/GU                   Deferred             Neuro:                         Grossly non-focal and symmetrical throughout             Skin:                            Clean and dry, no rashes, no breakdown   Diagnostic Tests:  Cardiac Catheterization Procedure Note  Name: LAWSEN ARNOTT   MRN: [id number]   DOB: 09-04-1951   Procedure: Right Heart Cath, Left Heart Cath, Selective Coronary Angiography, LV angiography, pressure wire interrogation of the left anterior descending artery.   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 He is known to  have aortic stenosis and was recently diagnosed with atrial fibrillation. He has been having significant symptoms of dyspnea at rest and with minimal activities. He underwent cardioversion for atrial fibrillation but did not maintain sinus rhythm for a long time. He had an echocardiogram done which showed moderate to severe aortic stenosis with a valve area close to 1 cm. He was referred for right and left cardiac catheterization for further evaluation.   Procedural Details: The right groin was prepped, draped, and anesthetized with 1% lidocaine. Using the modified Seldinger technique a 5 French sheath was placed in the right femoral artery and a 7 French sheath was placed in the right femoral vein. A Swan-Ganz catheter was used for the right heart catheterization. Standard protocol was followed for recording of right heart pressures and sampling of oxygen saturations. Fick cardiac output was calculated. Standard Judkins catheters were used for selective coronary angiography and left ventriculography. There were no immediate procedural complications. The patient was transferred to the post catheterization recovery area for further monitoring.   Procedural Findings:   Hemodynamics  RA 14 mmHg   RV 60/7 mmHg   PA 59/21 mmHg   PCWP 28 mmHg   LV 152/12 mmHg . LVEDP: 24 mmHg   AO 125/67 mmHg   Oxygen saturations:   PA 59%   AO 91%   Cardiac Output (Fick) : 4.74   Cardiac Index (Fick): 2.25   Aortic Valve: Peak to Peak gradient: 27 mmHg , Mean gradient: 36 mmHg, Valve area: 1. 02   Pulmonary vascular resistance (PVR): 1.9 Woods units.   Coronary angiography:   Coronary dominance: Right   Left Main: Mildly calcified without significant disease.     Left Anterior Descending (LAD): The vessel is normal in size and moderately calcified especially in the proximal and mid segment. In the proximal segment there is a 50% stenosis at the diagonal branch. Shortly after that there is another lesion of 60-70%  stenosis. The rest of  the LAD has minor irregularities without evidence of obstructive disease. There are collaterals from the septal branch to the distal RCA.     1st diagonal (D1): Large in size and gives 3 medium size branches. There is 70-80% proximal disease. This is a bifurcation stenosis with the left anterior descending artery.     2nd diagonal (D2): Very small in size     3rd diagonal (D3): Very small in size.     Circumflex (LCx): Small in size and nondominant. There is mild diffuse disease in the proximal and midsegment. The vessel is occluded in the midsegment.     1st obtuse marginal: Very small in size without significant disease.     Right Coronary Artery: Normal in size and dominant. It is occluded proximally.     SVG to OM 3: Patent with mild disease only.     SVG to right PDA: Is patent with mild 20% disease proximally otherwise no evidence of obstructive disease. Left ventriculography: Left ventricular systolic function is moderately reduced , LVEF is estimated at 35-40 %, there is no significant mitral regurgitation . There is severe inferior wall hypokinesis. The left ventricle is mildly dilated.   Pressure wire interrogation of the left anterior descending artery: I decided to proceed with pressure wire interrogation of the left anterior descending artery. The patient was given 4000 units of unfractionated heparin. ACT was 248. A Volcano pressure wire was used in the standard fashion. IV adenosine was started at a dose of 140 mcg per kilogram per minute. FFR at rest was 0.87 and with adenosine infusion was 0.71 indicating physiologic significance of the stenosis. The wire was then removed as well as the guiding catheter. The sheath was removed and manual pressure was applied.   Final Conclusions:   1. moderately elevated filling pressures with mild to moderate pulmonary hypertension.(PVR: 1.9 Woods units). Mildly reduced cardiac output.   2. Moderately reduced LV systolic  function with an estimated ejection fraction of 35-40%.   3. Significant three-vessel coronary artery disease with patent SVG to OM and SVG to RCA.   4. Significant disease in the proximal left anterior descending artery as well as a large first diagonal. FFR ratio in the left anterior descending artery was 0.71. This is a bifurcation stenosis that involves both left anterior descending artery and the ostium of a large diagonal branch.   5. Moderate to severe aortic stenosis with a mean gradient of 36 mm Hg and aortic valve area of 1.02.    Impression:  At least moderate aortic stenosis with underlying moderate left ventricular systolic dysfunction and three-vessel coronary artery disease status post coronary artery bypass grafting x2 in 2004. Grafts place at the time of the patient's original surgery remained patent the patient has had progression of native artery artery disease tickle involving the left anterior descending coronary artery territory. Most recently the patient has developed persistent atrial fibrillation that has failed an attempted DC cardioversion and has been associated with significant acceleration of symptoms of congestive heart failure, now functional class II to class III. I agree that he would probably best be treated with surgical intervention. Risks of surgery will be considerable but reasonable under the circumstances and associated with the best long-term prognosis.   Plan:  I discussed options at length with the patient here in the office today. The rationale for surgical intervention has been discussed and contrasted with prognosis with continued medical therapy. We discussed a variety of surgical alternatives including aortic valve replacement,  redo coronary artery bypass grafting, and Maze procedure. We discussed whether or not to replace his aortic valve using a mechanical prosthesis or a bioprosthetic tissue valve. The relative risks and benefits of each approach have  been compared and contrasted and also put into the context of the patient's diagnosis of atrial fibrillation. The patient requests under the circumstances to have his valve replaced using a mechanical prosthesis to avoid the potential for late structural valve deterioration in failure. He understands that this will, with the need for lifelong anticoagulation.  The patient understands and accepts all potential associated risks of surgery including but not limited to risk of death, stroke, myocardial infarction, congestive heart failure, respiratory failure, renal failure, bleeding requiring blood transfusion and/or reexploration, arrhythmia, heart block or bradycardia requiring permanent pacemaker, pneumonia, pleural effusion, wound infection, pulmonary embolus or other thromboembolic complication, chronic pain or other delayed complications.  All questions answered.  We plan to proceed with surgery on February 7. The patient will stop taking Coumadin on January 31 in anticipation of his upcoming surgery.     Salvatore Decent. Cornelius Moras, MD

## 2012-01-12 ENCOUNTER — Inpatient Hospital Stay (HOSPITAL_COMMUNITY)
Admission: RE | Admit: 2012-01-12 | Discharge: 2012-01-23 | DRG: 105 | Disposition: A | Discharge status: Home / Self Care | Payer: BC Managed Care – PPO | Source: Ambulatory Visit | Attending: Thoracic Surgery (Cardiothoracic Vascular Surgery) | Admitting: Thoracic Surgery (Cardiothoracic Vascular Surgery)

## 2012-01-12 ENCOUNTER — Encounter (HOSPITAL_COMMUNITY): Payer: Self-pay | Admitting: Thoracic Surgery (Cardiothoracic Vascular Surgery)

## 2012-01-12 ENCOUNTER — Ambulatory Visit (HOSPITAL_COMMUNITY): Payer: BC Managed Care – PPO | Admitting: *Deleted

## 2012-01-12 ENCOUNTER — Encounter (HOSPITAL_COMMUNITY): Payer: Self-pay | Admitting: *Deleted

## 2012-01-12 ENCOUNTER — Other Ambulatory Visit: Payer: Self-pay | Admitting: Thoracic Surgery (Cardiothoracic Vascular Surgery)

## 2012-01-12 ENCOUNTER — Inpatient Hospital Stay (HOSPITAL_COMMUNITY): Payer: BC Managed Care – PPO

## 2012-01-12 ENCOUNTER — Encounter (HOSPITAL_COMMUNITY)
Admission: RE | Disposition: A | Discharge status: Home / Self Care | Payer: Self-pay | Source: Ambulatory Visit | Attending: Thoracic Surgery (Cardiothoracic Vascular Surgery)

## 2012-01-12 DIAGNOSIS — I10 Essential (primary) hypertension: Secondary | ICD-10-CM | POA: Diagnosis present

## 2012-01-12 DIAGNOSIS — Z87891 Personal history of nicotine dependence: Secondary | ICD-10-CM

## 2012-01-12 DIAGNOSIS — E785 Hyperlipidemia, unspecified: Secondary | ICD-10-CM | POA: Diagnosis present

## 2012-01-12 DIAGNOSIS — Z7901 Long term (current) use of anticoagulants: Secondary | ICD-10-CM

## 2012-01-12 DIAGNOSIS — Z952 Presence of prosthetic heart valve: Secondary | ICD-10-CM

## 2012-01-12 DIAGNOSIS — E119 Type 2 diabetes mellitus without complications: Secondary | ICD-10-CM | POA: Diagnosis present

## 2012-01-12 DIAGNOSIS — E8779 Other fluid overload: Secondary | ICD-10-CM | POA: Diagnosis not present

## 2012-01-12 DIAGNOSIS — Z9889 Other specified postprocedural states: Secondary | ICD-10-CM

## 2012-01-12 DIAGNOSIS — I2510 Atherosclerotic heart disease of native coronary artery without angina pectoris: Secondary | ICD-10-CM

## 2012-01-12 DIAGNOSIS — I359 Nonrheumatic aortic valve disorder, unspecified: Principal | ICD-10-CM | POA: Diagnosis present

## 2012-01-12 DIAGNOSIS — I252 Old myocardial infarction: Secondary | ICD-10-CM

## 2012-01-12 DIAGNOSIS — I509 Heart failure, unspecified: Secondary | ICD-10-CM | POA: Diagnosis present

## 2012-01-12 DIAGNOSIS — D62 Acute posthemorrhagic anemia: Secondary | ICD-10-CM | POA: Diagnosis not present

## 2012-01-12 DIAGNOSIS — I4891 Unspecified atrial fibrillation: Secondary | ICD-10-CM | POA: Diagnosis present

## 2012-01-12 DIAGNOSIS — Z951 Presence of aortocoronary bypass graft: Secondary | ICD-10-CM

## 2012-01-12 DIAGNOSIS — E039 Hypothyroidism, unspecified: Secondary | ICD-10-CM | POA: Diagnosis present

## 2012-01-12 DIAGNOSIS — Z8679 Personal history of other diseases of the circulatory system: Secondary | ICD-10-CM

## 2012-01-12 DIAGNOSIS — D72829 Elevated white blood cell count, unspecified: Secondary | ICD-10-CM | POA: Diagnosis not present

## 2012-01-12 HISTORY — PX: CORONARY ARTERY BYPASS GRAFT: SHX141

## 2012-01-12 HISTORY — PX: AORTIC VALVE REPLACEMENT: SHX41

## 2012-01-12 HISTORY — PX: MAZE: SHX5063

## 2012-01-12 LAB — CBC
HCT: 40.2 % (ref 39.0–52.0)
Hemoglobin: 14.2 g/dL (ref 13.0–17.0)
MCHC: 35.3 g/dL (ref 30.0–36.0)
MCV: 90.4 fL (ref 78.0–100.0)
Platelets: 143 10*3/uL — ABNORMAL LOW (ref 150–400)
RBC: 4.43 MIL/uL (ref 4.22–5.81)
RBC: 3.42 MIL/uL — ABNORMAL LOW (ref 4.22–5.81)
RDW: 13.8 % (ref 11.5–15.5)
WBC: 11 10*3/uL — ABNORMAL HIGH (ref 4.0–10.5)
WBC: 22.5 10*3/uL — ABNORMAL HIGH (ref 4.0–10.5)

## 2012-01-12 LAB — POCT I-STAT 4, (NA,K, GLUC, HGB,HCT)
Glucose, Bld: 84 mg/dL (ref 70–99)
Glucose, Bld: 167 mg/dL — ABNORMAL HIGH (ref 70–99)
Glucose, Bld: 138 mg/dL — ABNORMAL HIGH (ref 70–99)
HCT: 27 % — ABNORMAL LOW (ref 39.0–52.0)
HCT: 27 % — ABNORMAL LOW (ref 39.0–52.0)
Hemoglobin: 12.9 g/dL — ABNORMAL LOW (ref 13.0–17.0)
Hemoglobin: 12.6 g/dL — ABNORMAL LOW (ref 13.0–17.0)
Hemoglobin: 9.5 g/dL — ABNORMAL LOW (ref 13.0–17.0)
Potassium: 4.2 mEq/L (ref 3.5–5.1)
Potassium: 5.3 mEq/L — ABNORMAL HIGH (ref 3.5–5.1)
Potassium: 4.2 mEq/L (ref 3.5–5.1)
Sodium: 138 mEq/L (ref 135–145)
Sodium: 141 mEq/L (ref 135–145)
Sodium: 138 mEq/L (ref 135–145)
Sodium: 141 mEq/L (ref 135–145)

## 2012-01-12 LAB — POCT I-STAT 3, ART BLOOD GAS (G3+)
Acid-base deficit: 1 mmol/L (ref 0.0–2.0)
Acid-base deficit: 4 mmol/L — ABNORMAL HIGH (ref 0.0–2.0)
Bicarbonate: 24 mEq/L (ref 20.0–24.0)
Bicarbonate: 21.6 mEq/L (ref 20.0–24.0)
Bicarbonate: 21.4 mEq/L (ref 20.0–24.0)
O2 Saturation: 100 %
O2 Saturation: 92 %
O2 Saturation: 91 %
Patient temperature: 37.2
Patient temperature: 37.3
TCO2: 23 mmol/L (ref 0–100)
pCO2 arterial: 37.4 mmHg (ref 35.0–45.0)
pCO2 arterial: 39 mmHg (ref 35.0–45.0)
pH, Arterial: 7.393 (ref 7.350–7.450)
pO2, Arterial: 80 mmHg (ref 80.0–100.0)

## 2012-01-12 LAB — HEMOGLOBIN AND HEMATOCRIT, BLOOD
HCT: 28.6 % — ABNORMAL LOW (ref 39.0–52.0)
Hemoglobin: 10.2 g/dL — ABNORMAL LOW (ref 13.0–17.0)

## 2012-01-12 LAB — GLUCOSE, CAPILLARY
Glucose-Capillary: 124 mg/dL — ABNORMAL HIGH (ref 70–99)
Glucose-Capillary: 139 mg/dL — ABNORMAL HIGH (ref 70–99)
Glucose-Capillary: 146 mg/dL — ABNORMAL HIGH (ref 70–99)
Glucose-Capillary: 141 mg/dL — ABNORMAL HIGH (ref 70–99)

## 2012-01-12 LAB — PLATELET COUNT: Platelets: 98 10*3/uL — ABNORMAL LOW (ref 150–400)

## 2012-01-12 LAB — PROTIME-INR: INR: 1.49 (ref 0.00–1.49)

## 2012-01-12 LAB — APTT: aPTT: 32 seconds (ref 24–37)

## 2012-01-12 LAB — POCT I-STAT GLUCOSE
Glucose, Bld: 132 mg/dL — ABNORMAL HIGH (ref 70–99)
Operator id: 3406

## 2012-01-12 SURGERY — REDO CORONARY ARTERY BYPASS GRAFTING (CABG)
Anesthesia: General | Site: Chest | Wound class: Clean

## 2012-01-12 MED ORDER — MIDAZOLAM HCL 2 MG/2ML IJ SOLN: 2.0000 mg | INTRAMUSCULAR | Status: DC | PRN

## 2012-01-12 MED ORDER — PROTAMINE SULFATE 10 MG/ML IV SOLN
INTRAVENOUS | Status: DC | PRN
  Administered 2012-01-12: 220 mg via INTRAVENOUS

## 2012-01-12 MED ORDER — LACTATED RINGERS IV SOLN
INTRAVENOUS | Status: DC | PRN
  Administered 2012-01-12 (×2): via INTRAVENOUS

## 2012-01-12 MED ORDER — ALBUMIN HUMAN 5 % IV SOLN
INTRAVENOUS | Status: DC | PRN
  Administered 2012-01-12: 16:00:00 via INTRAVENOUS

## 2012-01-12 MED ORDER — METOPROLOL TARTRATE 1 MG/ML IV SOLN: 2.5000 mg | INTRAVENOUS | Status: DC | PRN

## 2012-01-12 MED ORDER — PHENYLEPHRINE HCL 10 MG/ML IJ SOLN
0.0000 ug/min | INTRAVENOUS | Status: DC
  Administered 2012-01-12: 5 ug/min via INTRAVENOUS

## 2012-01-12 MED ORDER — INSULIN REGULAR BOLUS VIA INFUSION
0.0000 [IU] | Freq: Three times a day (TID) | INTRAVENOUS | Status: DC
  Filled 2012-01-12: qty 10

## 2012-01-12 MED ORDER — ACETAMINOPHEN 160 MG/5ML PO SOLN: 650.0000 mg | ORAL | Status: AC

## 2012-01-12 MED ORDER — BISACODYL 5 MG PO TBEC
10.0000 mg | DELAYED_RELEASE_TABLET | Freq: Every day | ORAL | Status: DC
  Administered 2012-01-13 – 2012-01-22 (×5): 10 mg via ORAL
  Filled 2012-01-12 (×5): qty 2

## 2012-01-12 MED ORDER — ALBUMIN HUMAN 5 % IV SOLN
250.0000 mL | INTRAVENOUS | Status: AC | PRN
  Administered 2012-01-12: 250 mL via INTRAVENOUS

## 2012-01-12 MED ORDER — SODIUM CHLORIDE 0.9 % IJ SOLN
OROMUCOSAL | Status: DC | PRN
  Administered 2012-01-12 (×2): via TOPICAL

## 2012-01-12 MED ORDER — MILRINONE IN DEXTROSE 200-5 MCG/ML-% IV SOLN
0.2500 ug/kg/min | INTRAVENOUS | Status: DC
  Filled 2012-01-12: qty 100

## 2012-01-12 MED ORDER — SODIUM CHLORIDE 0.9 % IJ SOLN: 3.0000 mL | INTRAMUSCULAR | Status: DC | PRN

## 2012-01-12 MED ORDER — DOPAMINE-DEXTROSE 3.2-5 MG/ML-% IV SOLN
INTRAVENOUS | Status: DC | PRN
  Administered 2012-01-12: 3 ug/kg/min via INTRAVENOUS

## 2012-01-12 MED ORDER — DEXTROSE 5 % IV SOLN
0.7500 g | INTRAVENOUS | Status: DC | PRN
  Administered 2012-01-12: .75 g via INTRAVENOUS

## 2012-01-12 MED ORDER — MILRINONE IN DEXTROSE 200-5 MCG/ML-% IV SOLN
0.3000 ug/kg/min | INTRAVENOUS | Status: DC
  Administered 2012-01-12 (×2): 0.5 ug/kg/min via INTRAVENOUS
  Administered 2012-01-13: 0.2 ug/kg/min via INTRAVENOUS
  Filled 2012-01-12 (×2): qty 100

## 2012-01-12 MED ORDER — HEPARIN SODIUM (PORCINE) 1000 UNIT/ML IJ SOLN
INTRAMUSCULAR | Status: DC | PRN
  Administered 2012-01-12: 23000 [IU] via INTRAVENOUS

## 2012-01-12 MED ORDER — ASPIRIN EC 325 MG PO TBEC
325.0000 mg | DELAYED_RELEASE_TABLET | Freq: Every day | ORAL | Status: DC
  Administered 2012-01-13 – 2012-01-21 (×9): 325 mg via ORAL
  Filled 2012-01-12 (×10): qty 1

## 2012-01-12 MED ORDER — HEMOSTATIC AGENTS (NO CHARGE) OPTIME
TOPICAL | Status: DC | PRN
  Administered 2012-01-12: 1 via TOPICAL

## 2012-01-12 MED ORDER — CALCIUM CHLORIDE 10 % IV SOLN
1.0000 g | Freq: Once | INTRAVENOUS | Status: AC | PRN
  Filled 2012-01-12: qty 10

## 2012-01-12 MED ORDER — INSULIN REGULAR HUMAN 100 UNIT/ML IJ SOLN
INTRAMUSCULAR | Status: DC
  Administered 2012-01-12: 3.1 [IU]/h via INTRAVENOUS
  Administered 2012-01-13: 06:00:00 via INTRAVENOUS
  Filled 2012-01-12 (×2): qty 1

## 2012-01-12 MED ORDER — ROCURONIUM BROMIDE 100 MG/10ML IV SOLN
INTRAVENOUS | Status: DC | PRN
  Administered 2012-01-12: 50 mg via INTRAVENOUS

## 2012-01-12 MED ORDER — DOCUSATE SODIUM 100 MG PO CAPS
200.0000 mg | ORAL_CAPSULE | Freq: Every day | ORAL | Status: DC
  Administered 2012-01-13 – 2012-01-22 (×8): 200 mg via ORAL
  Filled 2012-01-12 (×10): qty 2

## 2012-01-12 MED ORDER — ASPIRIN 81 MG PO CHEW: 324.0000 mg | CHEWABLE_TABLET | Freq: Every day | ORAL | Status: DC

## 2012-01-12 MED ORDER — ACETAMINOPHEN 500 MG PO TABS
1000.0000 mg | ORAL_TABLET | Freq: Four times a day (QID) | ORAL | Status: DC
  Administered 2012-01-13 – 2012-01-15 (×9): 1000 mg via ORAL
  Filled 2012-01-12 (×12): qty 2

## 2012-01-12 MED ORDER — VECURONIUM BROMIDE 10 MG IV SOLR
INTRAVENOUS | Status: DC | PRN
  Administered 2012-01-12: 5 mg via INTRAVENOUS
  Administered 2012-01-12: 10 mg via INTRAVENOUS
  Administered 2012-01-12 (×2): 5 mg via INTRAVENOUS

## 2012-01-12 MED ORDER — SODIUM CHLORIDE 0.9 % IJ SOLN
3.0000 mL | Freq: Two times a day (BID) | INTRAMUSCULAR | Status: DC
  Administered 2012-01-13 – 2012-01-14 (×4): 3 mL via INTRAVENOUS

## 2012-01-12 MED ORDER — VANCOMYCIN HCL 1000 MG IV SOLR
1000.0000 mg | Freq: Once | INTRAVENOUS | Status: AC
  Administered 2012-01-13: 1000 mg via INTRAVENOUS
  Filled 2012-01-12: qty 1000

## 2012-01-12 MED ORDER — SODIUM CHLORIDE 0.9 % IV SOLN
10.0000 g | Freq: Once | INTRAVENOUS | Status: DC
  Filled 2012-01-12: qty 40

## 2012-01-12 MED ORDER — POTASSIUM CHLORIDE 10 MEQ/50ML IV SOLN: 10.0000 meq | INTRAVENOUS | Status: AC

## 2012-01-12 MED ORDER — LACTATED RINGERS IV SOLN
INTRAVENOUS | Status: DC | PRN
  Administered 2012-01-12: 06:00:00 via INTRAVENOUS

## 2012-01-12 MED ORDER — MIDAZOLAM HCL 5 MG/5ML IJ SOLN
INTRAMUSCULAR | Status: DC | PRN
  Administered 2012-01-12 (×3): 2 mg via INTRAVENOUS
  Administered 2012-01-12: 1 mg via INTRAVENOUS
  Administered 2012-01-12: 2 mg via INTRAVENOUS

## 2012-01-12 MED ORDER — ACETAMINOPHEN 650 MG RE SUPP
650.0000 mg | RECTAL | Status: AC
  Administered 2012-01-12: 650 mg via RECTAL

## 2012-01-12 MED ORDER — SODIUM CHLORIDE 0.9 % IV SOLN
INTRAVENOUS | Status: DC | PRN
  Administered 2012-01-12: 16:00:00 via INTRAVENOUS

## 2012-01-12 MED ORDER — PROPOFOL 10 MG/ML IV BOLUS
INTRAVENOUS | Status: DC | PRN
  Administered 2012-01-12: 100 mg via INTRAVENOUS

## 2012-01-12 MED ORDER — SODIUM CHLORIDE 0.9 % IV SOLN
0.1000 ug/kg/h | INTRAVENOUS | Status: DC
  Filled 2012-01-12: qty 4

## 2012-01-12 MED ORDER — BISACODYL 10 MG RE SUPP: 10.0000 mg | Freq: Every day | RECTAL | Status: DC

## 2012-01-12 MED ORDER — LACTATED RINGERS IV SOLN
INTRAVENOUS | Status: DC | PRN
  Administered 2012-01-12 (×2): via INTRAVENOUS

## 2012-01-12 MED ORDER — FENTANYL CITRATE 0.05 MG/ML IJ SOLN
INTRAMUSCULAR | Status: DC | PRN
  Administered 2012-01-12: 150 ug via INTRAVENOUS
  Administered 2012-01-12: 50 ug via INTRAVENOUS
  Administered 2012-01-12: 500 ug via INTRAVENOUS
  Administered 2012-01-12 (×3): 100 ug via INTRAVENOUS
  Administered 2012-01-12: 150 ug via INTRAVENOUS
  Administered 2012-01-12: 200 ug via INTRAVENOUS
  Administered 2012-01-12: 150 ug via INTRAVENOUS
  Administered 2012-01-12: 50 ug via INTRAVENOUS

## 2012-01-12 MED ORDER — SODIUM CHLORIDE 0.45 % IV SOLN
INTRAVENOUS | Status: DC
  Administered 2012-01-12: 20 mL/h via INTRAVENOUS

## 2012-01-12 MED ORDER — LACTATED RINGERS IV SOLN
INTRAVENOUS | Status: DC
  Administered 2012-01-12: 60 mL via INTRAVENOUS

## 2012-01-12 MED ORDER — SODIUM CHLORIDE 0.9 % IV SOLN
0.1000 ug/kg/h | INTRAVENOUS | Status: DC
  Administered 2012-01-12: 0.7 ug/kg/h via INTRAVENOUS

## 2012-01-12 MED ORDER — ONDANSETRON HCL 4 MG/2ML IJ SOLN: 4.0000 mg | Freq: Four times a day (QID) | INTRAMUSCULAR | Status: DC | PRN

## 2012-01-12 MED ORDER — ACETAMINOPHEN 160 MG/5ML PO SOLN
975.0000 mg | Freq: Four times a day (QID) | ORAL | Status: DC
  Administered 2012-01-13: 975 mg
  Filled 2012-01-12: qty 20.3
  Filled 2012-01-12 (×2): qty 40.6

## 2012-01-12 MED ORDER — NITROGLYCERIN IN D5W 200-5 MCG/ML-% IV SOLN
0.0000 ug/min | INTRAVENOUS | Status: DC
  Administered 2012-01-12: 10 ug/min via INTRAVENOUS

## 2012-01-12 MED ORDER — SODIUM CHLORIDE 0.9 % IV SOLN: 250.0000 mL | INTRAVENOUS | Status: DC

## 2012-01-12 MED ORDER — CHLORHEXIDINE GLUCONATE 4 % EX LIQD: 30.0000 mL | CUTANEOUS | Status: DC

## 2012-01-12 MED ORDER — FAMOTIDINE IN NACL 20-0.9 MG/50ML-% IV SOLN: 20.0000 mg | Freq: Two times a day (BID) | INTRAVENOUS | Status: DC

## 2012-01-12 MED ORDER — MORPHINE SULFATE 4 MG/ML IJ SOLN
2.0000 mg | INTRAMUSCULAR | Status: DC | PRN
  Administered 2012-01-12 – 2012-01-13 (×3): 4 mg via INTRAVENOUS
  Filled 2012-01-12 (×3): qty 1

## 2012-01-12 MED ORDER — OXYCODONE HCL 5 MG PO TABS
5.0000 mg | ORAL_TABLET | ORAL | Status: DC | PRN
  Administered 2012-01-13 (×2): 10 mg via ORAL
  Administered 2012-01-14: 5 mg via ORAL
  Administered 2012-01-15: 10 mg via ORAL
  Administered 2012-01-18: 5 mg via ORAL
  Administered 2012-01-19 (×2): 10 mg via ORAL
  Administered 2012-01-21 – 2012-01-22 (×2): 5 mg via ORAL
  Filled 2012-01-12: qty 1
  Filled 2012-01-12 (×3): qty 2
  Filled 2012-01-12: qty 1
  Filled 2012-01-12: qty 2
  Filled 2012-01-12: qty 1
  Filled 2012-01-12 (×2): qty 2

## 2012-01-12 MED ORDER — PANTOPRAZOLE SODIUM 40 MG PO TBEC
40.0000 mg | DELAYED_RELEASE_TABLET | Freq: Every day | ORAL | Status: DC
  Administered 2012-01-14 – 2012-01-22 (×9): 40 mg via ORAL
  Filled 2012-01-12 (×6): qty 1

## 2012-01-12 MED ORDER — SODIUM CHLORIDE 0.9 % IV SOLN
INTRAVENOUS | Status: DC
  Filled 2012-01-12: qty 40

## 2012-01-12 MED ORDER — LEVOTHYROXINE SODIUM 50 MCG PO TABS
50.0000 ug | ORAL_TABLET | Freq: Every day | ORAL | Status: DC
  Administered 2012-01-13 – 2012-01-23 (×11): 50 ug via ORAL
  Filled 2012-01-12 (×13): qty 1

## 2012-01-12 MED ORDER — DOPAMINE-DEXTROSE 3.2-5 MG/ML-% IV SOLN
0.0000 ug/kg/min | INTRAVENOUS | Status: DC
  Administered 2012-01-12: 3 ug/kg/min via INTRAVENOUS

## 2012-01-12 MED ORDER — SODIUM CHLORIDE 0.9 % IV SOLN
INTRAVENOUS | Status: DC
  Administered 2012-01-12: 20 mL via INTRAVENOUS
  Administered 2012-01-14: via INTRAVENOUS

## 2012-01-12 MED ORDER — DEXTROSE 5 % IV SOLN
1.5000 g | Freq: Two times a day (BID) | INTRAVENOUS | Status: AC
  Administered 2012-01-13 – 2012-01-14 (×4): 1.5 g via INTRAVENOUS
  Filled 2012-01-12 (×4): qty 1.5

## 2012-01-12 MED ORDER — SODIUM CHLORIDE 0.9 % IR SOLN
Status: DC | PRN
  Administered 2012-01-12: 9000 mL

## 2012-01-12 MED ORDER — MAGNESIUM SULFATE 40 MG/ML IJ SOLN
4.0000 g | Freq: Once | INTRAMUSCULAR | Status: AC
  Administered 2012-01-12: 4 g via INTRAVENOUS
  Filled 2012-01-12: qty 100

## 2012-01-12 MED ORDER — MILRINONE IN DEXTROSE 200-5 MCG/ML-% IV SOLN
INTRAVENOUS | Status: DC | PRN
  Administered 2012-01-12: .3 ug/kg/min via INTRAVENOUS

## 2012-01-12 MED ORDER — METOPROLOL TARTRATE 25 MG/10 ML ORAL SUSPENSION
12.5000 mg | Freq: Two times a day (BID) | ORAL | Status: DC
  Filled 2012-01-12 (×7): qty 5

## 2012-01-12 MED ORDER — MORPHINE SULFATE 2 MG/ML IJ SOLN: 1.0000 mg | INTRAMUSCULAR | Status: AC | PRN

## 2012-01-12 MED ORDER — METOPROLOL TARTRATE 12.5 MG HALF TABLET
12.5000 mg | ORAL_TABLET | Freq: Two times a day (BID) | ORAL | Status: DC
  Administered 2012-01-13 – 2012-01-14 (×4): 12.5 mg via ORAL
  Filled 2012-01-12 (×7): qty 1

## 2012-01-12 SURGICAL SUPPLY — 173 items
ADAPTER CARDIO PERF ANTE/RETRO (ADAPTER) ×2 IMPLANT
APPLICATOR TIP BIOGLUE STANDRD (MISCELLANEOUS) IMPLANT
APPLIER CLIP 9.375 MED OPEN (MISCELLANEOUS)
APPLIER CLIP 9.375 SM OPEN (CLIP)
ATTRACTOMAT 16X20 MAGNETIC DRP (DRAPES) ×2 IMPLANT
BAG DECANTER FOR FLEXI CONT (MISCELLANEOUS) ×4 IMPLANT
BANDAGE ELASTIC 4 VELCRO ST LF (GAUZE/BANDAGES/DRESSINGS) ×2 IMPLANT
BANDAGE ELASTIC 6 VELCRO ST LF (GAUZE/BANDAGES/DRESSINGS) ×2 IMPLANT
BANDAGE GAUZE ELAST BULKY 4 IN (GAUZE/BANDAGES/DRESSINGS) ×2 IMPLANT
BASKET HEART (ORDER IN 25'S) (MISCELLANEOUS) ×1
BASKET HEART (ORDER IN 25S) (MISCELLANEOUS) ×1 IMPLANT
BENZOIN TINCTURE PRP APPL 2/3 (GAUZE/BANDAGES/DRESSINGS) ×2 IMPLANT
BLADE SAW SAG 29X58X.64 (BLADE) IMPLANT
BLADE STERNUM SYSTEM 6 (BLADE) ×2 IMPLANT
BLADE SURG 11 STRL SS (BLADE) ×4 IMPLANT
BLADE SURG ROTATE 9660 (MISCELLANEOUS) IMPLANT
BOOT SUTURE AID YELLOW STND (SUTURE) ×2 IMPLANT
CANISTER SUCTION 2500CC (MISCELLANEOUS) ×2 IMPLANT
CANNULA AORTIC ROOT 20012 (MISCELLANEOUS) IMPLANT
CANNULA FEM VENOUS REMOTE 22FR (CANNULA) ×2 IMPLANT
CANNULA GUNDRY RCSP 15FR (MISCELLANEOUS) ×2 IMPLANT
CANNULA VENNOUS METAL TIP 20FR (CANNULA) ×2 IMPLANT
CARDIOBLATE CARDIAC ABLATION (MISCELLANEOUS)
CATH CPB KIT OWEN (MISCELLANEOUS) ×2 IMPLANT
CATH HEART VENT LEFT (CATHETERS) ×1 IMPLANT
CATH ROBINSON RED A/P 18FR (CATHETERS) ×10 IMPLANT
CATH THORACIC 28FR (CATHETERS) IMPLANT
CATH THORACIC 28FR RT ANG (CATHETERS) IMPLANT
CATH THORACIC 36FR (CATHETERS) ×2 IMPLANT
CATH THORACIC 36FR RT ANG (CATHETERS) ×2 IMPLANT
CLAMP ISOLATOR SYNERGY LG (MISCELLANEOUS) ×2 IMPLANT
CLIP APPLIE 9.375 MED OPEN (MISCELLANEOUS) IMPLANT
CLIP APPLIE 9.375 SM OPEN (CLIP) IMPLANT
CLIP FOGARTY SPRING 6M (CLIP) IMPLANT
CLIP RETRACTION 3.0MM CORONARY (MISCELLANEOUS) ×2 IMPLANT
CLIP TI MEDIUM 24 (CLIP) IMPLANT
CLIP TI MEDIUM 6 (CLIP) IMPLANT
CLIP TI WIDE RED SMALL 24 (CLIP) IMPLANT
CLIP TI WIDE RED SMALL 6 (CLIP) IMPLANT
CLOTH BEACON ORANGE TIMEOUT ST (SAFETY) ×2 IMPLANT
CONN Y 3/8X3/8X3/8  BEN (MISCELLANEOUS)
CONN Y 3/8X3/8X3/8 BEN (MISCELLANEOUS) IMPLANT
CONNECTOR 1/2X3/8X1/2 3 WAY (MISCELLANEOUS) ×1
CONNECTOR 1/2X3/8X1/2 3WAY (MISCELLANEOUS) ×1 IMPLANT
CONT SPEC 4OZ CLIKSEAL STRL BL (MISCELLANEOUS) ×2 IMPLANT
COVER SURGICAL LIGHT HANDLE (MISCELLANEOUS) ×4 IMPLANT
CRADLE DONUT ADULT HEAD (MISCELLANEOUS) ×2 IMPLANT
DERMABOND ADVANCED (GAUZE/BANDAGES/DRESSINGS) ×1
DERMABOND ADVANCED .7 DNX12 (GAUZE/BANDAGES/DRESSINGS) ×1 IMPLANT
DEVICE CARDIOBLATE CARDIAC ABL (MISCELLANEOUS) IMPLANT
DRAIN CHANNEL 32F RND 10.7 FF (WOUND CARE) ×2 IMPLANT
DRAPE CARDIOVASCULAR INCISE (DRAPES) ×1
DRAPE INCISE IOBAN 66X45 STRL (DRAPES) ×2 IMPLANT
DRAPE SLUSH/WARMER DISC (DRAPES) ×2 IMPLANT
DRAPE SRG 135X102X78XABS (DRAPES) ×1 IMPLANT
DRSG COVADERM 4X14 (GAUZE/BANDAGES/DRESSINGS) ×2 IMPLANT
ELECT BLADE 4.0 EZ CLEAN MEGAD (MISCELLANEOUS) ×2
ELECT REM PT RETURN 9FT ADLT (ELECTROSURGICAL) ×4
ELECTRODE BLDE 4.0 EZ CLN MEGD (MISCELLANEOUS) ×1 IMPLANT
ELECTRODE REM PT RTRN 9FT ADLT (ELECTROSURGICAL) ×2 IMPLANT
GAUZE SPONGE 4X4 12PLY STRL LF (GAUZE/BANDAGES/DRESSINGS) ×2 IMPLANT
GLOVE BIO SURGEON STRL SZ 6 (GLOVE) ×4 IMPLANT
GLOVE BIO SURGEON STRL SZ 6.5 (GLOVE) ×8 IMPLANT
GLOVE BIO SURGEON STRL SZ7 (GLOVE) ×4 IMPLANT
GLOVE BIO SURGEON STRL SZ7.5 (GLOVE) IMPLANT
GLOVE BIOGEL PI IND STRL 6 (GLOVE) IMPLANT
GLOVE BIOGEL PI IND STRL 6.5 (GLOVE) ×1 IMPLANT
GLOVE BIOGEL PI IND STRL 7.0 (GLOVE) IMPLANT
GLOVE BIOGEL PI INDICATOR 6 (GLOVE)
GLOVE BIOGEL PI INDICATOR 6.5 (GLOVE) ×1
GLOVE BIOGEL PI INDICATOR 7.0 (GLOVE)
GLOVE EUDERMIC 7 POWDERFREE (GLOVE) IMPLANT
GLOVE EXAM NITRILE MD LF STRL (GLOVE) ×2 IMPLANT
GLOVE ORTHO TXT STRL SZ7.5 (GLOVE) ×6 IMPLANT
GOWN STRL NON-REIN LRG LVL3 (GOWN DISPOSABLE) ×8 IMPLANT
HEMOSTAT POWDER SURGIFOAM 1G (HEMOSTASIS) ×6 IMPLANT
INSERT FOGARTY 61MM (MISCELLANEOUS) IMPLANT
INSERT FOGARTY XLG (MISCELLANEOUS) ×2 IMPLANT
KIT BASIN OR (CUSTOM PROCEDURE TRAY) ×2 IMPLANT
KIT DILATOR VASC 18G NDL (KITS) ×4 IMPLANT
KIT DRAINAGE VACCUM ASSIST (KITS) ×2 IMPLANT
KIT PAIN CUSTOM (MISCELLANEOUS) IMPLANT
KIT ROOM TURNOVER OR (KITS) ×2 IMPLANT
KIT SUCTION CATH 14FR (SUCTIONS) ×2 IMPLANT
KIT VASOVIEW W/TROCAR VH 2000 (KITS) ×2 IMPLANT
LEAD PACING MYOCARDI (MISCELLANEOUS) ×2 IMPLANT
LINE VENT (MISCELLANEOUS) ×2 IMPLANT
LOOP VESSEL SUPERMAXI WHITE (MISCELLANEOUS) ×2 IMPLANT
MARKER GRAFT CORONARY BYPASS (MISCELLANEOUS) ×10 IMPLANT
MICRO BLADE 41.00MM X 19.5 MM ×2 IMPLANT
NS IRRIG 1000ML POUR BTL (IV SOLUTION) ×10 IMPLANT
PACK OPEN HEART (CUSTOM PROCEDURE TRAY) ×2 IMPLANT
PAD ARMBOARD 7.5X6 YLW CONV (MISCELLANEOUS) ×8 IMPLANT
PAD DEFIB R2 (MISCELLANEOUS) IMPLANT
PENCIL BUTTON HOLSTER BLD 10FT (ELECTRODE) ×2 IMPLANT
PROBE CRYO2-ABLATION MALLABLE (MISCELLANEOUS) ×2 IMPLANT
PUNCH AORTIC ROT 4.0MM RCL 40 (MISCELLANEOUS) ×2 IMPLANT
PUNCH AORTIC ROTATE 4.0MM (MISCELLANEOUS) IMPLANT
PUNCH AORTIC ROTATE 4.5MM 8IN (MISCELLANEOUS) IMPLANT
PUNCH AORTIC ROTATE 5MM 8IN (MISCELLANEOUS) IMPLANT
SET CARDIOPLEGIA MPS 5001102 (MISCELLANEOUS) ×2 IMPLANT
SET IRRIG TUBING LAPAROSCOPIC (IRRIGATION / IRRIGATOR) ×8 IMPLANT
SOLUTION ANTI FOG 6CC (MISCELLANEOUS) ×4 IMPLANT
SPONGE GAUZE 4X4 12PLY (GAUZE/BANDAGES/DRESSINGS) ×4 IMPLANT
SPONGE INTESTINAL PEANUT (DISPOSABLE) IMPLANT
SPONGE LAP 18X18 X RAY DECT (DISPOSABLE) ×2 IMPLANT
SPONGE LAP 4X18 X RAY DECT (DISPOSABLE) ×4 IMPLANT
STRIP CLOSURE SKIN 1/2X4 (GAUZE/BANDAGES/DRESSINGS) ×2 IMPLANT
SUCKER INTRACARDIAC WEIGHTED (SUCKER) ×2 IMPLANT
SUT BONE WAX W31G (SUTURE) ×4 IMPLANT
SUT ETHIBON 2 0 V 52N 30 (SUTURE) ×6 IMPLANT
SUT ETHIBON EXCEL 2-0 V-5 (SUTURE) IMPLANT
SUT ETHIBOND 2 0 SH (SUTURE)
SUT ETHIBOND 2 0 SH 36X2 (SUTURE) IMPLANT
SUT ETHIBOND 2 0 V4 (SUTURE) IMPLANT
SUT ETHIBOND 2 0V4 GREEN (SUTURE) IMPLANT
SUT ETHIBOND 4 0 RB 1 (SUTURE) IMPLANT
SUT ETHIBOND NAB MH 2-0 36IN (SUTURE) ×2 IMPLANT
SUT ETHIBOND V-5 VALVE (SUTURE) IMPLANT
SUT ETHIBOND X763 2 0 SH 1 (SUTURE) ×8 IMPLANT
SUT MNCRL AB 3-0 PS2 18 (SUTURE) ×8 IMPLANT
SUT MNCRL AB 4-0 PS2 18 (SUTURE) IMPLANT
SUT PDS AB 1 CTX 36 (SUTURE) ×4 IMPLANT
SUT PDS AB 1 CTXB1 36 (SUTURE) ×4 IMPLANT
SUT PROLENE 3 0 SH DA (SUTURE) ×6 IMPLANT
SUT PROLENE 3 0 SH1 36 (SUTURE) IMPLANT
SUT PROLENE 4 0 RB 1 (SUTURE) ×2
SUT PROLENE 4 0 SH DA (SUTURE) ×2 IMPLANT
SUT PROLENE 4-0 RB1 .5 CRCL 36 (SUTURE) ×2 IMPLANT
SUT PROLENE 5 0 C 1 36 (SUTURE) IMPLANT
SUT PROLENE 6 0 C 1 30 (SUTURE) ×10 IMPLANT
SUT PROLENE 6 0 CC (SUTURE) IMPLANT
SUT PROLENE 7 0 BV 1 (SUTURE) IMPLANT
SUT PROLENE 7 0 BV1 MDA (SUTURE) IMPLANT
SUT PROLENE 7.0 RB 3 (SUTURE) ×10 IMPLANT
SUT PROLENE 8 0 BV175 6 (SUTURE) ×2 IMPLANT
SUT PROLENE BLUE 7 0 (SUTURE) ×4 IMPLANT
SUT PROLENE POLY MONO (SUTURE) IMPLANT
SUT SILK  1 MH (SUTURE) ×2
SUT SILK 1 MH (SUTURE) ×2 IMPLANT
SUT SILK 1 TIES 10X30 (SUTURE) ×2 IMPLANT
SUT SILK 2 0 SH CR/8 (SUTURE) IMPLANT
SUT SILK 3 0 SH CR/8 (SUTURE) IMPLANT
SUT STEEL 6MS V (SUTURE) IMPLANT
SUT STEEL STERNAL CCS#1 18IN (SUTURE) IMPLANT
SUT STEEL SZ 6 DBL 3X14 BALL (SUTURE) IMPLANT
SUT TEM PAC WIRE 2 0 SH (SUTURE) ×6 IMPLANT
SUT VIC AB 1 CTX 36 (SUTURE)
SUT VIC AB 1 CTX36XBRD ANBCTR (SUTURE) IMPLANT
SUT VIC AB 2-0 CT1 27 (SUTURE) ×1
SUT VIC AB 2-0 CT1 TAPERPNT 27 (SUTURE) ×1 IMPLANT
SUT VIC AB 2-0 CTX 27 (SUTURE) ×2 IMPLANT
SUT VIC AB 3-0 SH 27 (SUTURE)
SUT VIC AB 3-0 SH 27X BRD (SUTURE) IMPLANT
SUT VIC AB 3-0 X1 27 (SUTURE) ×2 IMPLANT
SUT VICRYL 0 UR6 27IN ABS (SUTURE) ×2 IMPLANT
SUTURE E-PAK OPEN HEART (SUTURE) ×2 IMPLANT
SYR 10ML KIT SKIN ADHESIVE (MISCELLANEOUS) IMPLANT
SYS ATRICLIP LAA EXCLUSION 45 (CLIP) IMPLANT
SYSTEM SAHARA CHEST DRAIN ATS (WOUND CARE) ×2 IMPLANT
TAPE CLOTH SURG 4X10 WHT LF (GAUZE/BANDAGES/DRESSINGS) ×2 IMPLANT
TOWEL OR 17X24 6PK STRL BLUE (TOWEL DISPOSABLE) ×2 IMPLANT
TOWEL OR 17X26 10 PK STRL BLUE (TOWEL DISPOSABLE) ×4 IMPLANT
TRAY FOLEY IC TEMP SENS 14FR (CATHETERS) ×2 IMPLANT
TUBE CONNECTING 12X1/4 (SUCTIONS) ×4 IMPLANT
TUBE SUCT INTRACARD DLP 20F (MISCELLANEOUS) ×2 IMPLANT
TUBING INSUFFLATION 10FT LAP (TUBING) ×2 IMPLANT
UNDERPAD 30X30 INCONTINENT (UNDERPADS AND DIAPERS) ×2 IMPLANT
VALVE AORTIC TOP HAT (Prosthesis & Implant Heart) ×1 IMPLANT
VALVE AORTIC TOP HAT 23 (Prosthesis & Implant Heart) ×1 IMPLANT
VENT LEFT HEART 12002 (CATHETERS) ×2
WATER STERILE IRR 1000ML POUR (IV SOLUTION) ×4 IMPLANT
YANKAUER SUCT BULB TIP NO VENT (SUCTIONS) ×2 IMPLANT

## 2012-01-12 NOTE — Preoperative (Signed)
Beta Blockers   Reason not to administer Beta Blockers:pt took this am 01-12-12

## 2012-01-12 NOTE — OR Nursing (Signed)
Procedures start time: Left leg vein harvest start at 08:46. CABG and Aortic Valve Replacement start at 09:12. Time outs completed for each procedure.

## 2012-01-12 NOTE — Transfer of Care (Signed)
Immediate Anesthesia Transfer of Care Note  Patient: [name]  Procedure(s) Performed:  REDO CORONARY ARTERY BYPASS GRAFTING (CABG) - Three grafts; Endoscopically harvested left saphenous vein graft and left internal mammary artery; AORTIC VALVE REPLACEMENT (AVR) - NO NECKLINE ON THE RIGHT; MAZE - NO NECKLINE ON THE RIGHT  Patient Location: SICU  Anesthesia Type: General  Level of Consciousness: sedated and unresponsive  Airway & Oxygen Therapy: Patient remains intubated per anesthesia plan and Patient placed on Ventilator (see vital sign flow sheet for setting)  Post-op Assessment: Report given to PACU RN and Post -op Vital signs reviewed and stable  Post vital signs: Reviewed and stable  Complications: No apparent anesthesia complications

## 2012-01-12 NOTE — Progress Notes (Signed)
*  PRELIMINARY RESULTS* Echocardiogram Echocardiogram Transesophageal has been performed.  Katheren Puller 01/12/2012, 9:42 AM

## 2012-01-12 NOTE — Progress Notes (Signed)
Pt. Took toprol this am at 0400

## 2012-01-12 NOTE — Progress Notes (Signed)
[facility] BRIEF SICU PROGRESS NOTE  Day of Surgery  S/P Procedure(s) (LRB): REDO CORONARY ARTERY BYPASS GRAFTING (CABG) (N/A) AORTIC VALVE REPLACEMENT (AVR) (N/A) MAZE (N/A)   Waking up on vent Stable hemodynamics Chest tube output low Labs okay  Plan: Continue routine postop  OWEN,CLARENCE H 01/12/2012 9:36 PM

## 2012-01-12 NOTE — Anesthesia Procedure Notes (Signed)
Procedures TEE Placement: 0800 TEE probe lubricated and placed down oropharynx without difficulty. CE

## 2012-01-12 NOTE — Brief Op Note (Addendum)
                   [address]            [place] [address]          [phone]    01/12/2012  2:38 PM  PATIENT:  [name]  61 y.o. male  PRE-OPERATIVE DIAGNOSIS:  Aortic Stenosis,Coronary Artery Disease ,Atrial Fibrillation  POST-OPERATIVE DIAGNOSIS:  Aortic Stenosis,Coronary Artery Disease ,Atrial Fibrillation  PROCEDURE:  Procedure(s): REDO CORONARY ARTERY BYPASS GRAFTING (CABG)x3 (LIMA-LAD; SVG-DIAG; SVG-RCA) AORTIC VALVE REPLACEMENT (AVR)#23 TOP HAT MECHANICAL MAZE PROCEDURE (Left Side Lesion Set)  SURGEON:  Surgeon(s): Purcell Nails, MD  PHYSICIAN ASSISTANT: WAYNE GOLD PA-C  ANESTHESIA:   general  PATIENT CONDITION:  ICU - intubated and hemodynamically stable.  PRE-OPERATIVE WEIGHT: 97kg  COMPLICATIONS: NO KNOWN   [name],[name] 01/12/2012 4:45 PM

## 2012-01-12 NOTE — Interval H&P Note (Signed)
History and Physical Interval Note:  01/12/2012 7:37 AM  [name]  has presented today for surgery, with the diagnosis of AS,CAD,A FIB  The various methods of treatment have been discussed with the patient and family. After consideration of risks, benefits and other options for treatment, the patient has consented to  Procedure(s): REDO CORONARY ARTERY BYPASS GRAFTING (CABG) AORTIC VALVE REPLACEMENT (AVR) MAZE as a surgical intervention .  The patients' history has been reviewed, patient examined, no change in status, stable for surgery.  I have reviewed the patients' chart and labs.  Questions were answered to the patient's satisfaction.     OWEN,CLARENCE H

## 2012-01-12 NOTE — Anesthesia Preprocedure Evaluation (Addendum)
Anesthesia Evaluation  Patient identified by MRN, date of birth, ID band Patient awake    Reviewed: Allergy & Precautions, H&P , NPO status , Patient's Chart, lab work & pertinent test results, reviewed documented beta blocker date and time   Airway Mallampati: II TM Distance: >3 FB Neck ROM: Full    Dental  (+) Teeth Intact   Pulmonary shortness of breath, sleep apnea and Continuous Positive Airway Pressure Ventilation , pneumonia , former smoker clear to auscultation        Cardiovascular hypertension, Pt. on home beta blockers and Pt. on medications + CAD, + Past MI and +CHF + dysrhythmias Atrial Fibrillation + Valvular Problems/Murmurs     Neuro/Psych    GI/Hepatic hiatal hernia,   Endo/Other  Diabetes mellitus-, Type 2Hypothyroidism   Renal/GU negative Renal ROS     Musculoskeletal   Abdominal   Peds  Hematology   Anesthesia Other Findings   Reproductive/Obstetrics                         Anesthesia Physical Anesthesia Plan  ASA: IV  Anesthesia Plan: General   Post-op Pain Management:    Induction: Intravenous  Airway Management Planned: Oral ETT  Additional Equipment: Arterial line and PA Cath  Intra-op Plan:   Post-operative Plan: Post-operative intubation/ventilation  Informed Consent:   Dental advisory given  Plan Discussed with: CRNA  Anesthesia Plan Comments:         Anesthesia Quick Evaluation

## 2012-01-12 NOTE — Anesthesia Postprocedure Evaluation (Signed)
  Anesthesia Post-op Note  Patient: [name]  Procedure(s) Performed:  REDO CORONARY ARTERY BYPASS GRAFTING (CABG) - Three grafts; Endoscopically harvested left saphenous vein graft and left internal mammary artery; AORTIC VALVE REPLACEMENT (AVR) - NO NECKLINE ON THE RIGHT; MAZE - NO NECKLINE ON THE RIGHT  Patient Location: SICU  Anesthesia Type: General  Level of Consciousness: sedated and unresponsive  Airway and Oxygen Therapy: Patient remains intubated per anesthesia plan and Patient placed on Ventilator (see vital sign flow sheet for setting)  Post-op Pain: none  Post-op Assessment: Post-op Vital signs reviewed, Patient's Cardiovascular Status Stable, Patent Airway and No signs of Nausea or vomiting  Post-op Vital Signs: Reviewed and stable  Complications: No apparent anesthesia complications

## 2012-01-12 NOTE — Op Note (Signed)
CARDIOTHORACIC SURGERY OPERATIVE NOTE  Date of Procedure: 01/12/2012  Preoperative Diagnosis:   Severe Aortic Stenosis  Severe 3-vessel Coronary Artery Disease   s/p CABG x2 in 2004  Recurrent Persistent Atrial Fibrillation  Postoperative Diagnosis:   Severe Aortic Stenosis  Severe 3-vessel Coronary Artery Disease   s/p CABG x2 in 2004  Recurrent Persistent Atrial Fibrillation   Procedure:    Aortic Valve Replacement  23 mm Sorin Carbomedics Top Hat mechanical prosthetic heart valve    Redo Coronary Artery Bypass Grafting x 3  Left Internal Mammary Artery to Distal Left Anterior Descending Coronary Artery  Saphenous Vein Graft to Diagonal Branch Coronary Artery  Saphenous Vein Graft to Old Saphenous Vein Graft to Posterior Descending Coronary Artery  Endoscopic Vein Harvest from Left Thigh  Maze Procedure  Left Side Lesion Set   Surgeon: Salvatore Decent. Cornelius Moras, MD  Assistant: Gershon Crane, PA-C  Anesthesia: Judie Petit, MD and Achille Rich, MD  Operative Findings:  Moderate-severe calcific aortic stenosis  Mild-moderate left ventricular dysfunction with inferior wall hypokinesis  Good quality left internal mammary artery conduit  Good quality saphenous vein conduit  Good quality target vessels for grafting   BRIEF CLINICAL NOTE AND INDICATIONS FOR SURGERY  Patient is a 61 year old male from [place], [place] with known history of coronary artery disease and aortic stenosis followed by Dr. Diona Browner.  The patient suffered inferior wall myocardial infarction in 2000 and later underwent coronary artery bypass grafting x2 in 2004 for severe two-vessel coronary artery disease status post failed attempted percutaneous coronary intervention.  He recovered from coronary artery bypass surgery uneventfully and has done well until recently. He has long-standing history of a heart murmur and was noted to have sclerotic aortic valve at the time of this surgery  in 2004. He has subsequently developed aortic stenosis that has been followed on serial echocardiograms. An echo performed in March 2012 reportedly demonstrated a mean aortic gradient of 26 mm mercury with peak velocity across the valve of 3.4 m/s. Left ventricular systolic function remained nearly normal with ejection fraction 55-60% at that time. He was followed with medical therapy.  In October of 2012 the patient developed fairly significant exertional shortness of breath which has progressed over the last several months. He was found to be in persistent atrial fibrillation. He underwent attempted DC cardioversion in December but returned to atrial fibrillation shortly thereafter. His symptoms of exertional shortness of breath have persisted and remain quite significant. The patient therefore underwent left and right heart catheterization 12/21/2011. This confirmed the presence of at least moderate aortic stenosis with mean transvalvular gradient measured 36 mm mercury and valve area estimated 1.02 cm. There was moderate pulmonary hypertension with PA pressures measured 59/21 point A wedge pressure of 28. There was also a progression of native coronary artery disease with 70% stenosis of the proximal left anterior ascending coronary artery and 70-80% stenosis of the first diagonal branch. The vein graft to the obtuse marginal branch is patent although the terminal portion of this vessel is chronically occluded. The composite vein and right internal mammary artery graft to the posterior descending coronary artery is patent.  There is at least moderate left ventricular dysfunction with ejection fraction estimated 35-40%. There is severe inferior wall hypokinesis and mild left ventricular chamber enlargement. FloWire interrogation of the left anterior descending coronary artery was performed confirming that the disease in this segment of vessel is hemodynamically significant. The patient has been referred to  consider elective surgical intervention.  The patient  has been counseled at length regarding the indications, risks, and potential benefits of surgery. Alternative treatment strategies been discussed. The patient provides full informed consent for the operation as described. The patient specifically requests that his valve be replaced using a mechanical prosthesis. All of his questions have been addressed.    DETAILS OF THE OPERATIVE PROCEDURE  The patient is brought to the operating room on the above mentioned date and central monitoring was established by the anesthesia team including placement of Swan-Ganz catheter and radial arterial line. The patient is placed in the supine position on the operating table.  Intravenous antibiotics are administered. General endotracheal anesthesia is induced uneventfully. A Foley catheter is placed.  Baseline transesophageal echocardiogram was performed.  Findings were notable for mild-moderate LV dysfunction with inferior wall hypokinesis.  There was moderate-severe aortic stenosis.  There was mild aortic insufficiency and mild mitral regurgitation.  There was moderate LV hypertrophy.  The patient's chest, abdomen, both groins, and both lower extremities are prepared and draped in a sterile manner. A time out procedure is performed.  The greater saphenous vein is obtained from the patient's left thigh using endoscopic vein harvest technique. The saphenous vein is notably good quality conduit. After removal of the saphenous vein, the small surgical incisions in the lower extremity are closed with absorbable suture.   A redo median sternotomy incision was performed.  The sternum was divided with an oscillating saw. Sternal reentry was uneventful. The sternum was then dissected away from the structures of the mediastinum.  The left internal mammary artery is dissected from the chest wall and prepared for bypass grafting. The left internal mammary artery is notably  good quality conduit. Following systemic heparinization, the left internal mammary artery was transected distally noted to have excellent flow.  Dissection is now beginning mediastinum. Initial dissection is performed to identify the ascending aorta and the innominate vein. The patent vein grafts from previous surgery in 2004 identified at their proximal anastomosis and carefully avoided.  The right common femoral vein is cannulated with Seldinger technique and a flexible guidewire is advanced using TEE guidance through the right atrium into the superior vena cava. The patient is heparinized systemically. A long femoral venous cannula is then advanced under TEE guidance until the tip of the cannula passes through the right atrium. The ascending aorta is cannulated just beyond the takeoff of the innominate artery.  The entire pre-bypass portion of the operation was notable for stable hemodynamics.  Cardioplegia bypass was begun. Vacuum assist venous drainage is utilized. Dissection is performed to dissect the right atrium away from associated structures, again with care to avoid patent vein graft to right coronary system. A second venous cannula is placed directly in the superior vena cava. A retrograde cardioplegia cannula is placed through the right atrium into the coronary sinus. An antegrade cardioplegia cannula is placed directly in the ascending aorta.  The patient is cooled to 28C systemic temperature.  The aortic cross clamp is applied and cold blood cardioplegia is delivered initially in an antegrade fashion through the aortic root.  Supplemental cardioplegia is given retrograde through the coronary sinus catheter.  Iced saline slush is applied for topical hypothermia.  The initial cardioplegic arrest is rapid with early diastolic arrest.  Repeat doses of cardioplegia are administered intermittently throughout the entire cross clamp portion of the operation through the aortic root, through the  coronary sinus catheter, and through subsequently placed vein grafts in order to maintain completely flat electrocardiogram and septal myocardial  temperature below 15C.  Myocardial protection was felt to be excellent.  Dissection was now continued to circumferentially dissect the epicardial surface of the heart away from adjacent structures and the pericardial sac. This is somewhat tedious due to the dense nature of adhesions. Ultimately the heart is completely mobilized circumferentially. Distal target vessels are selected for bypass grafting. The proximal aorta is also dissected away from associated structures.  The proximal end of the vein graft to the right coronary system is transected in order to expose the aortic root.   The Atricure bipolar radiofrequency ablation clamp is used for all radiofrequency ablation lesions for the maze procedure.  The heart is retracted towards the surgeon's side and the left sided pulmonary veins exposed.  An elliptical ablation lesion is created around the base of the left sided pulmonary veins.  The following distal coronary artery bypass grafts were performed:   The diagonal branch of the left anterior descending coronary artery was grafted using a reversed saphenous vein graft in an end-to-side fashion.  At the site of distal anastomosis the target vessel was fair quality and measured approximately 1.4 mm in diameter.  The transected distal end of the old vein graft to right coronary system was grafted using a reversed saphenous vein graft in an end-to-end fashion.    The distal left anterior coronary artery was grafted with the left internal mammary artery in an end-to-side fashion.  At the site of distal anastomosis the target vessel was good quality and measured approximately 1.7 mm in diameter.  A left atriotomy incision was performed through the interatrial groove and extended partially across the back wall of the left atrium after opening the oblique  sinus inferiorly.  The floor of the left atrium and the mitral valve were exposed using a self-retaining retractor.    An ablation lesion was placed around the right sided pulmonary veins using the bipolar clamp with one limb of the clamp along the endocardial surface and one along the epicardial surface posteriorly.  A bipolar ablation lesion was placed across the dome of the left atrium from the cephalad apex of the atriotomy incision to reach the cephalad apex of the elliptical lesion around the left sided pulmonary veins.  A similar bipolar lesion was placed across the back wall of the left atrium from the caudad apex of the atriotomy incision to reach the caudad apex of the elliptical lesion around the left sided pulmonary veins, thereby completing a box.  Finally another bipolar lesion was placed across the back wall of the left atrium from the caudad apex of the atriotomy incision towards the posterior mitral valve annulus.  This lesion was completed along the endocardial surface onto the posterior mitral annulus with a 3 minute duration cryothermy lesion, followed by a second cryothermy lesion along the posterior epicardial surface of the left atrium to the coronary sinus.  This completes the entire left side lesion set of the Cox maze procedure.  The atriotomy was closed using a 2-layer closure of running 3-0 Prolene suture after placing a sump drain across the mitral valve to serve as a left ventricular vent.   An oblique transverse aortotomy incision was performed.  The aortic valve was inspected and notable for moderate-severe calcific aortic stenosis.  The valve was tricuspid.  The aortic valve leaflets were excised sharply and the aortic annulus decalcified.  Decalcification was notably straightforward.  The aortic annulus was sized to accept a 23 mm prosthesis.  The aortic root and left ventricle  were irrigated with copious cold saline solution.  Aortic valve replacement was performed using  interrupted horizontal mattress 2-0 Ethibond pledgeted sutures with pledgets in the subannular position.  A Sorin [employer] mechanical heart valve prosthesis (size 23 mm, catalog # [id number], serial # [id number]) was implanted uneventfully. The valve seated appropriately with adequate space beneath the left main and right coronary artery.  The aortotomy was closed using a 2-layer closure of running 4-0 Prolene suture.  Both proximal vein graft anastomoses were placed directly to the ascending aorta prior to removal of the aortic cross clamp.  The septal myocardial temperature rose rapidly after reperfusion of the left internal mammary artery graft.  One final dose of warm retrograde "hot shot" cardioplegia was administered through the coronary sinus catheter while all air was evacuated through the aortic root.  The aortic cross clamp was removed after a total cross clamp time of 174 minutes.  All proximal and distal coronary anastomoses were inspected for hemostasis and appropriate graft orientation. Epicardial pacing wires are fixed to the right ventricular outflow tract and to the right atrial appendage. The patient is rewarmed to 37C temperature. The aortic and left ventricular vents were removed.  The superior vena cava cannula was removed.  The patient is weaned and disconnected from cardiopulmonary bypass.  The patient's rhythm at separation from bypass was AV paced.  The patient was weaned from cardioplegic bypass on low dose dopamine. Total cardiopulmonary bypass time for the operation was 242 minutes.  Followup transesophageal echocardiogram performed after separation from bypass revealed A well-seated mechanical valve in the aortic position with no sign of any paravalvular leak. There remained significant inferior wall hypokinesis. There was moderate mitral regurgitation which gradually improved during the post-bypass portion of the operation and return to baseline prior to termination of  the operation. Left ventricular function was unchanged from preoperatively. No other abnormalities were noted.  The aortic cannula was removed uneventfully. Protamine was administered to reverse the anticoagulation. The femoral venous cannula was removed and manual pressure held on the right groin for 30 minutes.  The mediastinum and pleural space were inspected for hemostasis and irrigated with saline solution. The mediastinum and left pleural space were drained using 4 chest tubes placed through separate stab incisions inferiorly.  The soft tissues anterior to the aorta were reapproximated loosely. The sternum is closed with double strength sternal wire. The soft tissues anterior to the sternum were closed in multiple layers and the skin is closed with a running subcuticular skin closure.  The post-bypass portion of the operation was notable for stable rhythm and hemodynamics.  The patient received 2 packs of platelets and 2 units fresh frozen plasma for thrombocytopenia and coagulopathy after separation from bypass.  The patient tolerated the procedure well and is transported to the surgical intensive care in stable condition. There are no intraoperative complications. All sponge instrument and needle counts are verified correct at completion of the operation.    Salvatore Decent. Cornelius Moras MD 01/12/2012 5:57 PM

## 2012-01-13 ENCOUNTER — Inpatient Hospital Stay (HOSPITAL_COMMUNITY): Payer: BC Managed Care – PPO

## 2012-01-13 ENCOUNTER — Encounter: Payer: BC Managed Care – PPO | Admitting: *Deleted

## 2012-01-13 ENCOUNTER — Encounter (HOSPITAL_COMMUNITY): Payer: Self-pay | Admitting: Thoracic Surgery (Cardiothoracic Vascular Surgery)

## 2012-01-13 LAB — CREATININE, SERUM: GFR calc Af Amer: >90 mL/min (ref >90)

## 2012-01-13 LAB — PREPARE FRESH FROZEN PLASMA

## 2012-01-13 LAB — GLUCOSE, CAPILLARY
Glucose-Capillary: 117 mg/dL — ABNORMAL HIGH (ref 70–99)
Glucose-Capillary: 121 mg/dL — ABNORMAL HIGH (ref 70–99)
Glucose-Capillary: 132 mg/dL — ABNORMAL HIGH (ref 70–99)
Glucose-Capillary: 136 mg/dL — ABNORMAL HIGH (ref 70–99)
Glucose-Capillary: 161 mg/dL — ABNORMAL HIGH (ref 70–99)
Glucose-Capillary: 85 mg/dL (ref 70–99)

## 2012-01-13 LAB — BASIC METABOLIC PANEL
BUN: 17 mg/dL (ref 6–23)
Chloride: 110 mEq/L (ref 96–112)
GFR calc Af Amer: >90 mL/min (ref >90)
GFR calc non Af Amer: 88 mL/min — ABNORMAL LOW (ref >90)
Potassium: 4.1 mEq/L (ref 3.5–5.1)
Sodium: 140 mEq/L (ref 135–145)

## 2012-01-13 LAB — POCT I-STAT, CHEM 8
HCT: 22 % — ABNORMAL LOW (ref 39.0–52.0)
Hemoglobin: 7.5 g/dL — ABNORMAL LOW (ref 13.0–17.0)
Potassium: 4.3 mEq/L (ref 3.5–5.1)
Sodium: 136 mEq/L (ref 135–145)
TCO2: 24 mmol/L (ref 0–100)

## 2012-01-13 LAB — PREPARE PLATELET PHERESIS: Unit division: 0

## 2012-01-13 LAB — CBC
HCT: 25.1 % — ABNORMAL LOW (ref 39.0–52.0)
MCHC: 34.3 g/dL (ref 30.0–36.0)
MCHC: 34.2 g/dL (ref 30.0–36.0)
MCV: 92.2 fL (ref 78.0–100.0)
Platelets: 161 10*3/uL (ref 150–400)
Platelets: 120 10*3/uL — ABNORMAL LOW (ref 150–400)
RDW: 13.9 % (ref 11.5–15.5)
RDW: 14.1 % (ref 11.5–15.5)
WBC: 23.1 10*3/uL — ABNORMAL HIGH (ref 4.0–10.5)
WBC: 22.6 10*3/uL — ABNORMAL HIGH (ref 4.0–10.5)

## 2012-01-13 LAB — MAGNESIUM
Magnesium: 2.8 mg/dL — ABNORMAL HIGH (ref 1.5–2.5)
Magnesium: 2.4 mg/dL (ref 1.5–2.5)

## 2012-01-13 MED ORDER — WARFARIN SODIUM 2.5 MG PO TABS
2.5000 mg | ORAL_TABLET | Freq: Every day | ORAL | Status: DC
  Administered 2012-01-13 – 2012-01-16 (×4): 2.5 mg via ORAL
  Filled 2012-01-13 (×5): qty 1

## 2012-01-13 MED ORDER — FUROSEMIDE 10 MG/ML IJ SOLN
20.0000 mg | Freq: Four times a day (QID) | INTRAMUSCULAR | Status: AC
  Administered 2012-01-13 (×2): 20 mg via INTRAVENOUS
  Administered 2012-01-13: 12:00:00 via INTRAVENOUS
  Administered 2012-01-14 (×4): 20 mg via INTRAVENOUS
  Filled 2012-01-13 (×7): qty 2

## 2012-01-13 MED ORDER — AMIODARONE HCL 200 MG PO TABS
200.0000 mg | ORAL_TABLET | Freq: Two times a day (BID) | ORAL | Status: DC
  Administered 2012-01-13 – 2012-01-22 (×19): 200 mg via ORAL
  Filled 2012-01-13 (×21): qty 1

## 2012-01-13 MED ORDER — POTASSIUM CHLORIDE 10 MEQ/50ML IV SOLN
10.0000 meq | INTRAVENOUS | Status: AC
  Administered 2012-01-13 (×3): 10 meq via INTRAVENOUS
  Filled 2012-01-13 (×3): qty 50

## 2012-01-13 MED ORDER — INSULIN ASPART 100 UNIT/ML ~~LOC~~ SOLN
0.0000 [IU] | SUBCUTANEOUS | Status: DC
  Administered 2012-01-13 – 2012-01-14 (×7): 4 [IU] via SUBCUTANEOUS
  Administered 2012-01-14: 8 [IU] via SUBCUTANEOUS
  Administered 2012-01-14 – 2012-01-15 (×2): 2 [IU] via SUBCUTANEOUS
  Filled 2012-01-13: qty 3

## 2012-01-13 MED ORDER — LISINOPRIL 10 MG PO TABS
10.0000 mg | ORAL_TABLET | Freq: Every day | ORAL | Status: DC
  Administered 2012-01-13 – 2012-01-23 (×11): 10 mg via ORAL
  Filled 2012-01-13 (×11): qty 1

## 2012-01-13 MED ORDER — INSULIN GLARGINE 100 UNIT/ML ~~LOC~~ SOLN
20.0000 [IU] | Freq: Two times a day (BID) | SUBCUTANEOUS | Status: DC
  Administered 2012-01-13 – 2012-01-15 (×6): 20 [IU] via SUBCUTANEOUS
  Filled 2012-01-13: qty 3

## 2012-01-13 MED ORDER — BOOST / RESOURCE BREEZE PO LIQD
1.0000 | Freq: Two times a day (BID) | ORAL | Status: DC
  Administered 2012-01-14 (×2): 1 via ORAL

## 2012-01-13 MED ORDER — POLYSACCHARIDE IRON 150 MG PO CAPS
150.0000 mg | ORAL_CAPSULE | Freq: Every day | ORAL | Status: DC
  Administered 2012-01-13 – 2012-01-23 (×11): 150 mg via ORAL
  Filled 2012-01-13 (×12): qty 1

## 2012-01-13 MED FILL — Sodium Chloride IV Soln 0.9%: INTRAVENOUS | Qty: 1000 | Status: AC

## 2012-01-13 MED FILL — Electrolyte-R (PH 7.4) Solution: INTRAVENOUS | Qty: 4000 | Status: AC

## 2012-01-13 MED FILL — Magnesium Sulfate Inj 50%: INTRAMUSCULAR | Qty: 10 | Status: AC

## 2012-01-13 MED FILL — Potassium Chloride Inj 2 mEq/ML: INTRAVENOUS | Qty: 40 | Status: AC

## 2012-01-13 MED FILL — Sodium Chloride Irrigation Soln 0.9%: Qty: 3000 | Status: AC

## 2012-01-13 MED FILL — Heparin Sodium (Porcine) Inj 1000 Unit/ML: INTRAMUSCULAR | Qty: 10 | Status: AC

## 2012-01-13 MED FILL — Heparin Sodium (Porcine) Inj 1000 Unit/ML: INTRAMUSCULAR | Qty: 30 | Status: AC

## 2012-01-13 MED FILL — Lidocaine HCl IV Inj 20 MG/ML: INTRAVENOUS | Qty: 5 | Status: AC

## 2012-01-13 MED FILL — Verapamil HCl IV Soln 2.5 MG/ML: INTRAVENOUS | Qty: 4 | Status: AC

## 2012-01-13 MED FILL — Nitroglycerin IV Soln 5 MG/ML: INTRAVENOUS | Qty: 10 | Status: AC

## 2012-01-13 MED FILL — Mannitol IV Soln 20%: INTRAVENOUS | Qty: 500 | Status: AC

## 2012-01-13 MED FILL — Lactated Ringer's Solution: INTRAVENOUS | Qty: 500 | Status: AC

## 2012-01-13 NOTE — Procedures (Signed)
Extubation Procedure Note  Patient Details:   Name: [name] DOB: Feb 25, 1951 MRN: [id number]   Airway Documentation:  Airway 8 mm (Active)  Secured at (cm) 23 cm 01/12/2012  9:05 PM  Measured From Lips 01/12/2012  9:05 PM  Secured Location Right 01/12/2012  9:05 PM  Secured By Pink Tape 01/12/2012  9:05 PM  Site Condition Dry 01/12/2012  9:05 PM    Evaluation  O2 sats: stable throughout Complications: No apparent complications Patient did tolerate procedure well. Bilateral Breath Sounds: Clear Suctioning: Airway Yes Pt. Was extubated to a 6L  without any complications, dyspnea or stridor noted. Pt. Achieved -30 on NIF but was unable to do an accurate VC due to coughing. MD was made aware & gave permission to extubate. Pt. Was instructed on IS X 5. Highest goal achieved was .  Sheryle Hail 01/12/2012, 21:45 PM

## 2012-01-13 NOTE — Progress Notes (Signed)
   CARDIOTHORACIC SURGERY PROGRESS NOTE   R1 Day Post-Op Procedure(s) (LRB): REDO CORONARY ARTERY BYPASS GRAFTING (CABG) (N/A) AORTIC VALVE REPLACEMENT (AVR) (N/A) MAZE (N/A)  Subjective: Looks great. Minimal soreness. Feels well. No complaints.  Objective: Vital signs: BP Readings from Last 1 Encounters:  01/13/12 127/56   Pulse Readings from Last 1 Encounters:  01/13/12 80   Resp Readings from Last 1 Encounters:  01/13/12 21   Temp Readings from Last 1 Encounters:  01/13/12 99.1 F (37.3 C)     Hemodynamics: PAP: (24-57)/(10-40) 38/13 mmHg CO:  [3.8 L/min-4.8 L/min] 4.8 L/min CI:  [1.8 L/min/m2-2.3 L/min/m2] 2.3 L/min/m2  Physical Exam:  Rhythm:   sinus  Breath sounds: clear  Heart sounds:  RRR  Incisions:  Dressings dry  Abdomen:  Soft, non tender  Extremities:  Warm, well perfused   Intake/Output from previous day: 02/07 0701 - 02/08 0700 In: 7726 [I.V.:4873; VWUJW:1191; NG/GT:60; IV Piggyback:650] Out: 4782 [Urine:3500; Emesis/NG output:100; Blood:2025; Chest Tube:1100] Intake/Output this shift:    Lab Results:  Basename 01/13/12 0420 01/12/12 1735  WBC 23.1* 22.5*  HGB 8.6* 10.8*10.9*  HCT 25.1* 30.9*32.0*  PLT 161 143*   BMET:  Basename 01/13/12 0420 01/12/12 1735  NA 140 141  K 4.1 4.2  CL 110 --  CO2 24 --  GLUCOSE 134* 138*  BUN 17 --  CREATININE 0.94 --  CALCIUM 8.0* --    CBG (last 3)   Basename 01/13/12 0440 01/13/12 0334 01/13/12 0208  GLUCAP 136* 132* 117*   ABG    Component Value Date/Time   PHART 7.353 01/12/2012 2257   HCO3 21.4 01/12/2012 2257   TCO2 23 01/12/2012 2257   ACIDBASEDEF 4.0* 01/12/2012 2257   O2SAT 91.0 01/12/2012 2257   CXR: Looks good.  Assessment/Plan: S/P Procedure(s) (LRB): REDO CORONARY ARTERY BYPASS GRAFTING (CABG) (N/A) AORTIC VALVE REPLACEMENT (AVR) (N/A) MAZE (N/A)  Doing very well POD 1 Expected post op acute blood loss anemia, mild Expected post op volume excess, mild   Mobilize  Wean  milrinone off  Start diuresis  Watch anemia   Start coumadin slowly  Leave pleural tubes one more day  [name] H 01/13/2012 8:13 AM

## 2012-01-13 NOTE — Progress Notes (Addendum)
INITIAL ADULT NUTRITION ASSESSMENT Date: 01/13/2012   Time: 11:05 AM  Reason for Assessment: Low Braden  ASSESSMENT: Male 61 y.o.  Dx: S/P aortic valve replacement  Hx:  Past Medical History  Diagnosis Date  . Mixed hyperlipidemia   . Coronary atherosclerosis of native coronary artery     2 vessel s/p CABG  . Type 2 diabetes mellitus   . Aortic stenosis     Moderate  . Myocardial infarction 1998  . Essential hypertension, benign     on medicaton  . Atrial fibrillation 10/2011    unsuccessful CV 11/2011  . Heart murmur     aortic stenosis  . Shortness of breath     exertional  . Sleep apnea 2010    wears CPAP nightly  . CHF (congestive heart failure) 11/2011    on Lasix to control edema  . Pneumonia 2010  . Diabetes mellitus     Type 2 NIDDM  X 10 years  . Hypothyroidism     on medication  . H/O hiatal hernia   . Arthritis     osteoarthritis  . Rotator cuff tear     Rt shoulder    Related Meds:     . acetaminophen (TYLENOL) oral liquid 160 mg/5 mL  650 mg Per Tube NOW   Or  . acetaminophen  650 mg Rectal NOW  . acetaminophen  1,000 mg Oral Q6H   Or  . acetaminophen (TYLENOL) oral liquid 160 mg/5 mL  975 mg Per Tube Q6H  . amiodarone  200 mg Oral BID PC  . aspirin EC  325 mg Oral Daily   Or  . aspirin  324 mg Per Tube Daily  . bisacodyl  10 mg Oral Daily   Or  . bisacodyl  10 mg Rectal Daily  . cefUROXime (ZINACEF)  IV  1.5 g Intravenous Q12H  . dexmedetomidine (PRECEDEX) IV infusion for high rates  0.1-0.7 mcg/kg/hr Intravenous To OR  . docusate sodium  200 mg Oral Daily  . furosemide  20 mg Intravenous Q6H  . insulin aspart  0-24 Units Subcutaneous Q4H  . insulin glargine  20 Units Subcutaneous BID  . insulin regular  0-10 Units Intravenous TID WC  . levothyroxine  50 mcg Oral QAC breakfast  . lisinopril  10 mg Oral Daily  . magnesium sulfate  4 g Intravenous Once  . metoprolol tartrate  12.5 mg Oral BID   Or  . metoprolol tartrate  12.5 mg Per  Tube BID  . pantoprazole  40 mg Oral Q1200  . phenylephrine (NEO-SYNEPHRINE) Adult infusion  30-200 mcg/min Intravenous To OR  . potassium chloride  10 mEq Intravenous Q1 Hr x 3  . potassium chloride  10 mEq Intravenous Q1 Hr x 3  . sodium chloride  3 mL Intravenous Q12H  . vancomycin (VANCOCIN) IVPB 1000 mg/100 mL central line  1,000 mg Intravenous Once  . warfarin  2.5 mg Oral q1800  . DISCONTD: aminocaproic acid (AMICAR) for OHS   Intravenous To OR  . DISCONTD: aminocaproic acid (AMICAR) IVPB  10 g Intravenous Once  . DISCONTD: cefUROXime (ZINACEF)  IV  750 mg Intravenous To OR  . DISCONTD: chlorhexidine  30 mL Topical UD  . DISCONTD: dexmedetomidine (PRECEDEX) IV infusion for high rates  0.1-0.7 mcg/kg/hr Intravenous To OR  . DISCONTD: DOPamine  2-20 mcg/kg/min Intravenous To OR  . DISCONTD: epinephrine  0.5-20 mcg/min Intravenous To OR  . DISCONTD: famotidine (PEPCID) IV  20 mg Intravenous Q12H  .  DISCONTD: magnesium sulfate  40 mEq Other To OR  . DISCONTD: metoprolol tartrate  12.5 mg Oral Once  . DISCONTD: potassium chloride  80 mEq Other To OR    Ht: 5\' 8"  (172.7 cm)  Wt: 223 lb 5.2 oz (101.3 kg)  Ideal Wt: 70 kg % Ideal Wt: 144%  Usual Wt: 216 lb -- per office visit January 2013 % Usual Wt: 103%  Body mass index is 33.96 kg/(m^2).  Food/Nutrition Related Hx: no nutrition problems PTA  Labs:  CMP     Component Value Date/Time   NA 140 01/13/2012 0420   K 4.1 01/13/2012 0420   CL 110 01/13/2012 0420   CO2 24 01/13/2012 0420   GLUCOSE 134* 01/13/2012 0420   BUN 17 01/13/2012 0420   CREATININE 0.94 01/13/2012 0420   CALCIUM 8.0* 01/13/2012 0420   PROT 7.8 01/09/2012 1324   ALBUMIN 4.1 01/09/2012 1324   AST 30 01/09/2012 1324   ALT 41 01/09/2012 1324   ALKPHOS 77 01/09/2012 1324   BILITOT 0.6 01/09/2012 1324   GFRNONAA 88* 01/13/2012 0420   GFRAA >90 01/13/2012 0420    I/O last 3 completed shifts: In: 7730.1 [I.V.:4877.1; ZOXWR:6045; NG/GT:60; IV Piggyback:650] Out: 6725 [Urine:3500;  Emesis/NG output:100; Blood:2025; Chest Tube:1100] Total I/O In: 849 [P.O.:720; I.V.:79; IV Piggyback:50] Out: 125 [Urine:125]  Diet Order: Clear Liquid  Supplements/Tube Feeding: N/A  IVF:    sodium chloride Last Rate: 20 mL/hr (01/12/12 1830)  sodium chloride Last Rate: 20 mL (01/12/12 1730)  sodium chloride   milrinone Last Rate: 0.1 mcg/kg/min (01/13/12 1006)  nitroGLYCERIN Last Rate: 20 mcg/min (01/12/12 1900)  DISCONTD: dexmedetomidine (PRECEDEX) IV infusion Last Rate: 0.5 mcg/kg/hr (01/12/12 1800)  DISCONTD: DOPamine Last Rate: Stopped (01/13/12 0400)  DISCONTD: insulin (NOVOLIN-R) infusion   DISCONTD: lactated ringers Last Rate: 60 mL (01/12/12 1730)  DISCONTD: milrinone   DISCONTD: phenylephrine (NEO-SYNEPHRINE) Adult infusion Last Rate: Stopped (01/13/12 0400)    Estimated Nutritional Needs:   Kcal: 2200-2300 Protein: 120-130 gm Fluid: 2.2-2.3 L  RD spoke with pt re: nutrition hx -- states he was eating well prior to hospitalization; no recent weight loss reported; currently on a Clear Liquid diet; pt had low braden score of 12 on 2/7; would benefit from addition of supplementation -- pt & family amenable.   NUTRITION DIAGNOSIS: -Increased nutrient needs (NI-5.1).  Status: Ongoing  RELATED TO: s/p aortic valve replacement  AS EVIDENCE BY: estimated nutrition needs  MONITORING/EVALUATION(Goals): Goal: meet >90% of estimated nutrition needs to promote post op healing & recovery Monitor: PO intake, weight, labs, I/O's  EDUCATION NEEDS: -No education needs identified at this time  INTERVENTION:  Resource Breeze supplement PO BID (250 kcals, 9 gm protein per 8 fl oz carton)  RD to follow for nutrition care plan  Dietitian [phone]  DOCUMENTATION CODES Per approved criteria  -Obesity Unspecified    Alger Memos 01/13/2012, 11:05 AM

## 2012-01-14 ENCOUNTER — Inpatient Hospital Stay (HOSPITAL_COMMUNITY): Payer: BC Managed Care – PPO

## 2012-01-14 LAB — GLUCOSE, CAPILLARY
Glucose-Capillary: 162 mg/dL — ABNORMAL HIGH (ref 70–99)
Glucose-Capillary: 172 mg/dL — ABNORMAL HIGH (ref 70–99)
Glucose-Capillary: 249 mg/dL — ABNORMAL HIGH (ref 70–99)

## 2012-01-14 LAB — PROTIME-INR
INR: 1.47 (ref 0.00–1.49)
Prothrombin Time: 18.1 seconds — ABNORMAL HIGH (ref 11.6–15.2)

## 2012-01-14 LAB — CBC
MCV: 92.3 fL (ref 78.0–100.0)
Platelets: 116 10*3/uL — ABNORMAL LOW (ref 150–400)
RBC: 2.34 MIL/uL — ABNORMAL LOW (ref 4.22–5.81)
RDW: 14.2 % (ref 11.5–15.5)
WBC: 22 10*3/uL — ABNORMAL HIGH (ref 4.0–10.5)

## 2012-01-14 LAB — BASIC METABOLIC PANEL
CO2: 26 mEq/L (ref 19–32)
Calcium: 8 mg/dL — ABNORMAL LOW (ref 8.4–10.5)
Creatinine, Ser: 1.09 mg/dL (ref 0.50–1.35)
GFR calc Af Amer: 83 mL/min — ABNORMAL LOW (ref >90)
GFR calc non Af Amer: 71 mL/min — ABNORMAL LOW (ref >90)
Sodium: 132 mEq/L — ABNORMAL LOW (ref 135–145)

## 2012-01-14 NOTE — Progress Notes (Signed)
Pt ambulated 400 feet. 3L [place].  Became SOB near end of ambulation; otherwise tolerated well.  Returned to a comfortable position in chair.  Remains on 3L [place], sats 100%, vitals stable.  Will continue to monitor.

## 2012-01-14 NOTE — Progress Notes (Signed)
Patient examined and record reviewed.Hemodynamics stable,labs satisfactory.Patient had stable day.Continue current care. VAN TRIGT III,[name] 01/14/2012    

## 2012-01-14 NOTE — Progress Notes (Signed)
2 Days Post-Op Procedure(s) (LRB): REDO CORONARY ARTERY BYPASS GRAFTING (CABG) (N/A) AORTIC VALVE REPLACEMENT (AVR) (N/A) MAZE (N/A) Subjective:                     [address]            [place] [address]          [phone]    NSR, offO2, walking in hall CBGs OK   Objective: Vital signs in last 24 hours: Temp:  [98.1 F (36.7 C)-98.8 F (37.1 C)] 98.5 F (36.9 C) (02/09 1331) Pulse Rate:  [65-81] 78  (02/09 1300) Cardiac Rhythm:  [-] Heart block (02/09 1200) Resp:  [9-31] 26  (02/09 1300) BP: (92-122)/(46-60) 122/60 mmHg (02/09 1300) SpO2:  [90 %-100 %] 90 % (02/09 1300) Weight:  [223 lb 15.8 oz (101.6 kg)] 223 lb 15.8 oz (101.6 kg) (02/09 0500)  Hemodynamic parameters for last 24 hours:  stable Intake/Output from previous day: 02/08 0701 - 02/09 0700 In: 3475.5 [P.O.:2870; I.V.:349.5; IV Piggyback:256] Out: 1830 [Urine:1440; Chest Tube:390] Intake/Output this shift: Total I/O In: 502 [P.O.:360; I.V.:140; IV Piggyback:2] Out: 205 [Urine:165; Chest Tube:40]  Lungs clear,  No edema  Lab Results:  Basename 01/14/12 0428 01/13/12 1716 01/13/12 1700  WBC 22.0* -- 22.6*  HGB 7.3* 7.5* --  HCT 21.6* 22.0* --  PLT 116* -- 120*   BMET:  Basename 01/14/12 0428 01/13/12 1716 01/13/12 0420  NA 132* 136 --  K 4.3 4.3 --  CL 101 103 --  CO2 26 -- 24  GLUCOSE 159* 173* --  BUN 24* 18 --  CREATININE 1.09 0.90 --  CALCIUM 8.0* -- 8.0*    PT/INR:  Basename 01/14/12 0428  LABPROT 18.1*  INR 1.47   ABG    Component Value Date/Time   PHART 7.353 01/12/2012 2257   HCO3 21.4 01/12/2012 2257   TCO2 24 01/13/2012 1716   ACIDBASEDEF 4.0* 01/12/2012 2257   O2SAT 91.0 01/12/2012 2257   CBG (last 3)   Basename 01/14/12 1153 01/14/12 0742 01/14/12 0417  GLUCAP 249* 172* 162*    Assessment/Plan: S/P Procedure(s) (LRB): REDO CORONARY ARTERY BYPASS GRAFTING (CABG) (N/A) AORTIC VALVE REPLACEMENT (AVR) (N/A) MAZE (N/A) Blood loss anemia,on po iron    LOS:  2 days    VAN TRIGT III,Shanira Tine 01/14/2012

## 2012-01-15 ENCOUNTER — Inpatient Hospital Stay (HOSPITAL_COMMUNITY): Payer: BC Managed Care – PPO

## 2012-01-15 LAB — GLUCOSE, CAPILLARY
Glucose-Capillary: 103 mg/dL — ABNORMAL HIGH (ref 70–99)
Glucose-Capillary: 157 mg/dL — ABNORMAL HIGH (ref 70–99)
Glucose-Capillary: 189 mg/dL — ABNORMAL HIGH (ref 70–99)

## 2012-01-15 LAB — CBC
HCT: 20.5 % — ABNORMAL LOW (ref 39.0–52.0)
Hemoglobin: 7.1 g/dL — ABNORMAL LOW (ref 13.0–17.0)
MCH: 31.4 pg (ref 26.0–34.0)
MCHC: 34.6 g/dL (ref 30.0–36.0)
MCV: 90.7 fL (ref 78.0–100.0)
Platelets: 124 10*3/uL — ABNORMAL LOW (ref 150–400)
RBC: 2.26 MIL/uL — ABNORMAL LOW (ref 4.22–5.81)
RDW: 14.2 % (ref 11.5–15.5)
WBC: 22.5 10*3/uL — ABNORMAL HIGH (ref 4.0–10.5)

## 2012-01-15 LAB — URINALYSIS, MICROSCOPIC ONLY
Hgb urine dipstick: NEGATIVE
Ketones, ur: NEGATIVE mg/dL
Nitrite: NEGATIVE
pH: 5 (ref 5.0–8.0)

## 2012-01-15 LAB — BASIC METABOLIC PANEL
BUN: 34 mg/dL — ABNORMAL HIGH (ref 6–23)
CO2: 27 mEq/L (ref 19–32)
Calcium: 8.2 mg/dL — ABNORMAL LOW (ref 8.4–10.5)
Chloride: 101 mEq/L (ref 96–112)
Creatinine, Ser: 1.14 mg/dL (ref 0.50–1.35)
GFR calc Af Amer: 78 mL/min — ABNORMAL LOW (ref >90)
GFR calc non Af Amer: 68 mL/min — ABNORMAL LOW (ref >90)
Glucose, Bld: 110 mg/dL — ABNORMAL HIGH (ref 70–99)
Potassium: 3.8 mEq/L (ref 3.5–5.1)
Sodium: 134 mEq/L — ABNORMAL LOW (ref 135–145)

## 2012-01-15 LAB — PROTIME-INR
INR: 1.53 — ABNORMAL HIGH (ref 0.00–1.49)
Prothrombin Time: 18.7 seconds — ABNORMAL HIGH (ref 11.6–15.2)

## 2012-01-15 MED ORDER — INSULIN ASPART 100 UNIT/ML ~~LOC~~ SOLN: 0.0000 [IU] | Freq: Every day | SUBCUTANEOUS | Status: DC

## 2012-01-15 MED ORDER — ACETAMINOPHEN 160 MG/5ML PO SOLN: 975.0000 mg | Freq: Four times a day (QID) | ORAL | Status: DC

## 2012-01-15 MED ORDER — INSULIN ASPART 100 UNIT/ML ~~LOC~~ SOLN
0.0000 [IU] | Freq: Three times a day (TID) | SUBCUTANEOUS | Status: DC
  Administered 2012-01-15 (×2): 3 [IU] via SUBCUTANEOUS
  Administered 2012-01-16 – 2012-01-23 (×7): 2 [IU] via SUBCUTANEOUS
  Filled 2012-01-15: qty 3

## 2012-01-15 MED ORDER — FUROSEMIDE 10 MG/ML IJ SOLN
40.0000 mg | Freq: Once | INTRAMUSCULAR | Status: AC
  Administered 2012-01-15: 40 mg via INTRAVENOUS
  Filled 2012-01-15: qty 4

## 2012-01-15 MED ORDER — ACETAMINOPHEN 500 MG PO TABS
1000.0000 mg | ORAL_TABLET | Freq: Four times a day (QID) | ORAL | Status: AC
  Administered 2012-01-15 – 2012-01-17 (×8): 1000 mg via ORAL
  Filled 2012-01-15 (×7): qty 2

## 2012-01-15 MED ORDER — METOPROLOL TARTRATE 25 MG/10 ML ORAL SUSPENSION
25.0000 mg | Freq: Two times a day (BID) | ORAL | Status: DC
  Filled 2012-01-15 (×2): qty 10

## 2012-01-15 MED ORDER — METOPROLOL TARTRATE 25 MG PO TABS
25.0000 mg | ORAL_TABLET | Freq: Two times a day (BID) | ORAL | Status: DC
  Administered 2012-01-15 – 2012-01-23 (×17): 25 mg via ORAL
  Filled 2012-01-15 (×18): qty 1

## 2012-01-15 MED ORDER — POTASSIUM CHLORIDE CRYS ER 20 MEQ PO TBCR
20.0000 meq | EXTENDED_RELEASE_TABLET | Freq: Two times a day (BID) | ORAL | Status: DC
  Administered 2012-01-15 – 2012-01-21 (×14): 20 meq via ORAL
  Filled 2012-01-15 (×18): qty 1

## 2012-01-15 MED ORDER — FUROSEMIDE 40 MG PO TABS
40.0000 mg | ORAL_TABLET | Freq: Every day | ORAL | Status: DC
  Administered 2012-01-15 – 2012-01-16 (×2): 40 mg via ORAL
  Filled 2012-01-15 (×2): qty 1

## 2012-01-15 NOTE — Progress Notes (Signed)
3 Days Post-Op Procedure(s) (LRB): REDO CORONARY ARTERY BYPASS GRAFTING (CABG) (N/A) AORTIC VALVE REPLACEMENT (AVR) (N/A) MAZE (N/A) Subjective:NSR,comfortable  Objective: Vital signs in last 24 hours: Temp:  [97.8 F (36.6 C)-99.1 F (37.3 C)] 98.2 F (36.8 C) (02/10 0754) Pulse Rate:  [64-81] 72  (02/10 0700) Cardiac Rhythm:  [-] Normal sinus rhythm (02/10 0800) Resp:  [0-30] 17  (02/10 0700) BP: (94-134)/(53-94) 120/64 mmHg (02/10 0700) SpO2:  [90 %-96 %] 94 % (02/10 0700) Weight:  [223 lb 12.3 oz (101.5 kg)] 223 lb 12.3 oz (101.5 kg) (02/10 0500)  Hemodynamic parameters for last 24 hours:  stable  Intake/Output from previous day: 02/09 0701 - 02/10 0700 In: 1679 [P.O.:1180; I.V.:493; IV Piggyback:6] Out: 1630 [Urine:1590; Chest Tube:40] Intake/Output this shift: Total I/O In: 20 [I.V.:20] Out: -   Exam lungs clear,min edema  Lab Results:  [facility] 01/15/12 0453 01/14/12 0428  WBC 22.5* 22.0*  HGB 7.1* 7.3*  HCT 20.5* 21.6*  PLT 124* 116*   BMET:  Basename 01/15/12 0453 01/14/12 0428  NA 134* 132*  K 3.8 4.3  CL 101 101  CO2 27 26  GLUCOSE 110* 159*  BUN 34* 24*  CREATININE 1.14 1.09  CALCIUM 8.2* 8.0*    PT/INR:  Basename 01/15/12 0453  LABPROT 18.7*  INR 1.53*   ABG    Component Value Date/Time   PHART 7.353 01/12/2012 2257   HCO3 21.4 01/12/2012 2257   TCO2 24 01/13/2012 1716   ACIDBASEDEF 4.0* 01/12/2012 2257   O2SAT 91.0 01/12/2012 2257   CBG (last 3)   Basename 01/15/12 0746 01/15/12 0337 01/14/12 2350  GLUCAP 136* 107* 189*    Assessment/Plan: S/P Procedure(s) (LRB): REDO CORONARY ARTERY BYPASS GRAFTING (CABG) (N/A) AORTIC VALVE REPLACEMENT (AVR) (N/A) MAZE (N/A) Plan for transfer to step-down: see transfer orders WBC has been elev since OR CXR clear,afebrile,U/A neg so far,incision clean  LOS: 3 days    VAN TRIGT III,Esiquio Boesen 01/15/2012

## 2012-01-16 LAB — TYPE AND SCREEN
Unit division: 0
Unit division: 0

## 2012-01-16 LAB — URINE CULTURE

## 2012-01-16 LAB — GLUCOSE, CAPILLARY
Glucose-Capillary: 124 mg/dL — ABNORMAL HIGH (ref 70–99)
Glucose-Capillary: 62 mg/dL — ABNORMAL LOW (ref 70–99)

## 2012-01-16 LAB — PROTIME-INR
INR: 1.6 — ABNORMAL HIGH (ref 0.00–1.49)
Prothrombin Time: 19.3 seconds — ABNORMAL HIGH (ref 11.6–15.2)

## 2012-01-16 MED ORDER — FUROSEMIDE 10 MG/ML IJ SOLN
40.0000 mg | Freq: Once | INTRAMUSCULAR | Status: AC
  Administered 2012-01-16: 40 mg via INTRAVENOUS
  Filled 2012-01-16: qty 4

## 2012-01-16 MED ORDER — METFORMIN HCL 500 MG PO TABS
1000.0000 mg | ORAL_TABLET | Freq: Two times a day (BID) | ORAL | Status: DC
  Administered 2012-01-16 – 2012-01-23 (×14): 1000 mg via ORAL
  Filled 2012-01-16 (×17): qty 2

## 2012-01-16 MED ORDER — FUROSEMIDE 40 MG PO TABS
40.0000 mg | ORAL_TABLET | Freq: Two times a day (BID) | ORAL | Status: DC
  Administered 2012-01-17 – 2012-01-23 (×13): 40 mg via ORAL
  Filled 2012-01-16 (×16): qty 1

## 2012-01-16 MED ORDER — WARFARIN SODIUM 5 MG PO TABS: ORAL_TABLET | ORAL | Status: AC

## 2012-01-16 MED ORDER — POTASSIUM CHLORIDE CRYS ER 20 MEQ PO TBCR: 20.0000 meq | EXTENDED_RELEASE_TABLET | Freq: Two times a day (BID) | ORAL | Status: DC

## 2012-01-16 MED ORDER — POLYSACCHARIDE IRON 150 MG PO CAPS: 150.0000 mg | ORAL_CAPSULE | Freq: Every day | ORAL | Status: DC

## 2012-01-16 MED ORDER — FUROSEMIDE 40 MG PO TABS: 40.0000 mg | ORAL_TABLET | Freq: Every day | ORAL | Status: DC

## 2012-01-16 MED ORDER — GLYBURIDE 5 MG PO TABS
5.0000 mg | ORAL_TABLET | Freq: Two times a day (BID) | ORAL | Status: DC
  Administered 2012-01-16 – 2012-01-20 (×9): 5 mg via ORAL
  Filled 2012-01-16 (×11): qty 1

## 2012-01-16 MED ORDER — METOPROLOL TARTRATE 25 MG PO TABS: 25.0000 mg | ORAL_TABLET | Freq: Two times a day (BID) | ORAL | Status: DC

## 2012-01-16 MED ORDER — AMIODARONE HCL 200 MG PO TABS: 200.0000 mg | ORAL_TABLET | Freq: Two times a day (BID) | ORAL | Status: DC

## 2012-01-16 MED ORDER — LISINOPRIL 10 MG PO TABS: 10.0000 mg | ORAL_TABLET | Freq: Every day | ORAL | Status: DC

## 2012-01-16 MED ORDER — ASPIRIN EC 325 MG PO TBEC: 325.0000 mg | DELAYED_RELEASE_TABLET | Freq: Every day | ORAL | Status: DC

## 2012-01-16 MED ORDER — OXYCODONE HCL 5 MG PO TABS: 5.0000 mg | ORAL_TABLET | ORAL | Status: AC | PRN

## 2012-01-16 NOTE — Progress Notes (Addendum)
                    [address]            [place] [address]          [phone]     4 Days Post-Op Procedure(s) (LRB): REDO CORONARY ARTERY BYPASS GRAFTING (CABG) (N/A) AORTIC VALVE REPLACEMENT (AVR) (N/A) MAZE (N/A)  Subjective: Comfortable, no complaints.  Objective: Vital signs in last 24 hours: Patient Vitals for the past 24 hrs:  BP Temp Temp src Pulse Resp SpO2 Weight  01/16/12 0503 143/83 mmHg 97.5 F (36.4 C) Oral 66  20  99 % 100.245 kg (221 lb)  01/15/12 1931 122/80 mmHg 98.7 F (37.1 C) Oral 79  21  93 % -  01/15/12 1436 112/64 mmHg 98.5 F (36.9 C) Oral 70  22  94 % -  01/15/12 1200 110/71 mmHg 97.3 F (36.3 C) Oral 69  22  96 % -   Current Weight  01/16/12 100.245 kg (221 lb)  Pre-op wt= 97 kg   Intake/Output from previous day: 02/10 0701 - 02/11 0700 In: 560 [P.O.:540; I.V.:20] Out: 600 [Urine:600]  CBGs 157-103-124-104  PHYSICAL EXAM:  Heart: RRR, good valve click Lungs: clear Wound: clean and dry Extremities: mild LE edema  Lab Results: CBC: Basename 01/15/12 0453 01/14/12 0428  WBC 22.5* 22.0*  HGB 7.1* 7.3*  HCT 20.5* 21.6*  PLT 124* 116*   BMET:  Basename 01/15/12 0453 01/14/12 0428  NA 134* 132*  K 3.8 4.3  CL 101 101  CO2 27 26  GLUCOSE 110* 159*  BUN 34* 24*  CREATININE 1.14 1.09  CALCIUM 8.2* 8.0*    PT/INR:  Basename 01/16/12 0540  LABPROT 19.3*  INR 1.60*     Assessment/Plan: S/P Procedure(s) (LRB): REDO CORONARY ARTERY BYPASS GRAFTING (CABG) (N/A) AORTIC VALVE REPLACEMENT (AVR) (N/A) MAZE (N/A) CV- stable, SR. Continue Lopressor. DM- d/c Lantus and resume po meds. Vol overload- diurese. ABL anemia- Stable, continue Fe. Leukocytosis- persistent since OR, but stable.  U/A rare bacteria, no fever.  Continue to watch. Anticoagulation for mechanical AVR ?home 1-2 days if INR continues to trend up and no changes.    LOS: 4 days    COLLINS,GINA H 01/16/2012   I have seen and examined  the patient and agree with the assessment and plan as outlined.  OWEN,CLARENCE H 01/16/2012 7:17 PM

## 2012-01-16 NOTE — Progress Notes (Signed)
Ambulated with pt in hallway with 1+ assistance & using a wheelchair, ambulated 250 ft, room air sat 94 %.

## 2012-01-16 NOTE — Discharge Summary (Signed)
[address]            [place] [address]          [phone]         Discharge Summary  Name: [name] DOB: 1951/06/04 61 y.o. MRN: [id number]  Admission Date: 01/12/2012 Discharge Date:    Admitting Diagnosis:  Moderate aortic stenosis  3 vessel coronary artery disease status post previous CABG in 2004   Discharge Diagnosis:   Moderate aortic stenosis  3 vessel coronary artery disease, status post previous CABG in 2004  History of previous myocardial infarction in 2000  Moderate left ventricular dysfunction with ejection fraction estimated at 35-40%  Hypertension  Hyperlipidemia  Type 2 diabetes mellitus  Chronic atrial fibrillation  Prior history of tobacco use  Postoperative acute blood loss anemia  Postoperative leukocytosis, stable  Procedures: Procedure(s):  REDO CORONARY ARTERY BYPASS GRAFTING (CABG) x 3 (left internal mammary artery to the distal LAD, saphenous vein graft to the diagonal, saphenous vein graft to the old saphenous vein graft to the posterior descending), endoscopic vein harvest left thigh  AORTIC VALVE REPLACEMENT (AVR) (23 mm Sorin [employer] Top Hat mechanical aortic valve)  MAZE PROCEDURE (left sided lesion set) on 01/12/2012   HPI:  The patient is a 61 y.o. male with known history of coronary artery disease and aortic stenosis followed by Dr. Diona Browner. The patient suffered inferior wall myocardial infarction in 2000 and later underwent coronary artery bypass grafting x2 in 2004 for severe two-vessel coronary artery disease status post failed attempted percutaneous coronary intervention. He recovered from coronary artery bypass surgery uneventfully and has done well until recently. He has long-standing history of a heart murmur and was noted to have sclerotic aortic valve at the time of this surgery in 2004. He has subsequently developed aortic stenosis that has been followed on serial  echocardiograms. An echo performed in March 2012 reportedly demonstrated a mean aortic gradient of 26 mm mercury with peak velocity across the valve of 3.4 m/s. Left ventricular systolic function remained nearly normal with ejection fraction 55-60% at that time. He was followed with medical therapy. In October of 2012 the patient developed fairly significant exertional shortness of breath which has progressed over the last several months. He was found to be in persistent atrial fibrillation. He underwent attempted DC cardioversion in December but returned to atrial fibrillation shortly thereafter. His symptoms of exertional shortness of breath have persisted and remain quite significant. The patient therefore underwent left and right heart catheterization 12/21/2011. This confirmed the presence of at least moderate aortic stenosis with mean transvalvular gradient measured 36 mm mercury and valve area estimated 1.02 cm. There was moderate pulmonary hypertension with PA pressures measured 59/21 point A wedge pressure of 28. There was also a progression of native coronary artery disease with 70% stenosis of the proximal left anterior ascending coronary artery and 70-80% stenosis of the first diagonal branch. The vein graft to the obtuse marginal branch is patent although the terminal portion of this vessel is chronically occluded. The composite vein and right internal mammary artery graft to the posterior descending coronary artery is patent. There is at least moderate left ventricular dysfunction with ejection fraction estimated 35-40%. There is severe inferior wall hypokinesis and mild left ventricular chamber enlargement. FloWire interrogation of the left anterior descending coronary artery was performed confirming that the disease in this segment of  vessel is hemodynamically significant. The patient was referred to Dr. Cornelius Moras to consider elective surgical intervention. The patient has been counseled at length  regarding the indications, risks, and potential benefits of surgery. Alternative treatment strategies been discussed. The patient provides full informed consent for the operation as described. The patient specifically requests that his valve be replaced using a mechanical prosthesis. All of his questions have been addressed.    Hospital Course:  The patient was admitted to [facility] on 01/12/2012. All risks, benefits and alternatives of surgery were explained in detail, and the patient agreed to proceed. The patient was taken to the operating room and underwent the above procedure.    The postoperative course has generally been uneventful. He has had a mild acute postoperative blood loss anemia which has not required a transfusion. He has been started on iron supplement and his hemoglobin and hematocrit have remained stable. He also has had a leukocytosis which has been persistent the entire postoperative course. He has had no fever and no signs or symptoms of infection on physical exam. A urinalysis was negative. The remainder of his labs been stable and his chest x-rays have been unremarkable. This has been observed closely and treated conservatively. He has maintained normal sinus rhythm postoperatively. He has been started on Coumadin and his INR is trending upward. He is tolerating a regular diet and is having normal bowel and bladder function. He is ambulatting the halls without difficulty. We anticipate discharge home within the next 24 hours provided no acute changes occur.   Recent vital signs:  Filed Vitals:   01/16/12 0503  BP: 143/83  Pulse: 66  Temp: 97.5 F (36.4 C)  Resp: 20    Recent laboratory studies:  CBC: Basename 01/15/12 0453 01/14/12 0428  WBC 22.5* 22.0*  HGB 7.1* 7.3*  HCT 20.5* 21.6*  PLT 124* 116*   BMET:  Basename 01/15/12 0453 01/14/12 0428  NA 134* 132*  K 3.8 4.3  CL 101 101  CO2 27 26  GLUCOSE 110* 159*  BUN 34* 24*  CREATININE 1.14 1.09  CALCIUM  8.2* 8.0*    PT/INR:  Basename 01/16/12 0540  LABPROT 19.3*  INR 1.60*    Discharge Medications:   Medication List  As of 01/16/2012 12:44 PM   STOP taking these medications         etodolac 500 MG tablet      isosorbide mononitrate 30 MG 24 hr tablet      metoprolol succinate 25 MG 24 hr tablet         TAKE these medications         amiodarone 200 MG tablet   Commonly known as: PACERONE   Take 1 tablet (200 mg total) by mouth 2 (two) times daily.      amLODipine-atorvastatin 5-20 MG per tablet   Commonly known as: CADUET   Take 1 tablet by mouth daily.      aspirin EC 325 MG tablet   Take 1 tablet (325 mg total) by mouth daily.      furosemide 40 MG tablet   Commonly known as: LASIX   Take 1 tablet (40 mg total) by mouth daily. X 1 week      glyBURIDE-metformin 2.5-500 MG per tablet   Commonly known as: GLUCOVANCE   Take 2 tablets by mouth 2 (two) times daily with a meal.      levothyroxine 50 MCG tablet   Commonly known as: SYNTHROID, LEVOTHROID   Take 50  mcg by mouth daily.      lisinopril 10 MG tablet   Commonly known as: PRINIVIL,ZESTRIL   Take 1 tablet (10 mg total) by mouth daily.      metoprolol tartrate 25 MG tablet   Commonly known as: LOPRESSOR   Take 1 tablet (25 mg total) by mouth 2 (two) times daily.      nitroGLYCERIN 0.4 MG SL tablet   Commonly known as: NITROSTAT   Place 0.4 mg under the tongue every 5 (five) minutes as needed. For chest pain      oxyCODONE 5 MG immediate release tablet   Commonly known as: Oxy IR/ROXICODONE   Take 1-2 tablets (5-10 mg total) by mouth every 3 (three) hours as needed for pain.      polysaccharide iron 150 MG Caps capsule   Commonly known as: NIFEREX   Take 1 capsule (150 mg total) by mouth daily.      potassium chloride SA 20 MEQ tablet   Commonly known as: K-DUR,KLOR-CON   Take 1 tablet (20 mEq total) by mouth 2 (two) times daily. X 1 week      VOLTAREN 1 % Gel   Generic drug: diclofenac sodium    Apply 1 application topically 4 (four) times daily as needed. For pain      warfarin 5 MG tablet   Commonly known as: COUMADIN   Take 0.5 tab (2.5 mg) daily or as directed by [facility]            Discharge Instructions:  The patient is to refrain from driving, heavy lifting or strenuous activity.  May shower daily and clean incisions with soap and water.  May resume regular diet.  Discharge Orders    Future Appointments: Provider: Department: Dept Phone: Center:   02/06/2012 12:00 PM Purcell Nails, MD [facility] [phone] [facility]      Follow-up Information    Follow up with Purcell Nails, MD on 02/06/2012. (Have a chest x-ray at 11:00, then see MD at 12:00)    Contact information:   [address] [place] [address] [phone]       Follow up with Nona Dell, MD. Schedule an appointment as soon as possible for a visit in 2 weeks.   Contact information:   [address] [place] [address] [phone]       Follow up with [facility]. (Have a PT/INR drawn within 48 hours of discharge for management of Coumadin)    Contact information:   [address] [place] [address] [phone]          Adella Hare 01/16/2012, 12:44 PM

## 2012-01-16 NOTE — Progress Notes (Signed)
   CARE MANAGEMENT NOTE 01/16/2012  Patient:  TAEJON, IRANI   Account Number:  [id number]  Date Initiated:  01/13/2012  Documentation initiated by:  [facility]  Subjective/Objective Assessment:   Post op CABGx 3, AVR and Maze on 01-12-12.  Has Spouse     Action/Plan:   PTA, PT INDEPENDENT, LIVES WITH SPOUSE.  WIFE AND MOTHER IN LAW TO PROVIDE 24HR CARE AT DISCHARGE.   Anticipated DC Date:  01/18/2012   Anticipated DC Plan:  HOME W HOME HEALTH SERVICES      DC Planning Services  CM consult      Choice offered to / List presented to:             Status of service:  In process, will continue to follow Medicare Important Message given?   (If response is "NO", the following Medicare IM given date fields will be blank) Date Medicare IM given:   Date Additional Medicare IM given:    Discharge Disposition:    Per UR Regulation:  Reviewed for med. necessity/level of care/duration of stay  Comments:  01/16/12 Ridge Lafond,RN,BSN 1130 MET WITH PT TO DISCUSS DC NEEDS.  HE THINKS HE NEEDS A RW FOR HOME.  WILL REQUEST FROM [facility].  WILL CONT TO FOLLOW FOR HOME NEEDS AS PT PROGRESSES. Phone #[phone]

## 2012-01-16 NOTE — Progress Notes (Signed)
CHEST TUBE SUTURE D/C, STERI STRIPS APPLIED . PATIENT TOLERATED WELL. CONTINUE TO MONITOR.   Marigene Ehlers RN

## 2012-01-16 NOTE — Progress Notes (Signed)
UR Completed.  [name] Jane [phone] 01/16/2012  

## 2012-01-16 NOTE — Progress Notes (Signed)
EPW discontinued per protocol. Tips intact. Patient tolerated well. Last INR INR/Prothrombin Time on   .  Patient advised Bedrest X 1 hour. Donell Sliwinski Aba RN   

## 2012-01-16 NOTE — Discharge Summary (Signed)
I agree with the above discharge summary and plan for follow-up.  Alaija Ruble H  

## 2012-01-17 LAB — BASIC METABOLIC PANEL
CO2: 26 mEq/L (ref 19–32)
Chloride: 103 mEq/L (ref 96–112)
GFR calc Af Amer: 89 mL/min — ABNORMAL LOW (ref >90)
Potassium: 3.8 mEq/L (ref 3.5–5.1)
Sodium: 139 mEq/L (ref 135–145)

## 2012-01-17 LAB — GLUCOSE, CAPILLARY
Glucose-Capillary: 98 mg/dL (ref 70–99)
Glucose-Capillary: 145 mg/dL — ABNORMAL HIGH (ref 70–99)
Glucose-Capillary: 81 mg/dL (ref 70–99)

## 2012-01-17 LAB — PROTIME-INR
INR: 1.63 — ABNORMAL HIGH (ref 0.00–1.49)
Prothrombin Time: 19.6 seconds — ABNORMAL HIGH (ref 11.6–15.2)

## 2012-01-17 LAB — CBC
MCV: 92 fL (ref 78.0–100.0)
Platelets: 254 10*3/uL (ref 150–400)
RBC: 2.37 MIL/uL — ABNORMAL LOW (ref 4.22–5.81)
RDW: 14.4 % (ref 11.5–15.5)
WBC: 17.5 10*3/uL — ABNORMAL HIGH (ref 4.0–10.5)

## 2012-01-17 MED ORDER — VANCOMYCIN HCL 1000 MG IV SOLR
1250.0000 mg | Freq: Two times a day (BID) | INTRAVENOUS | Status: AC
  Administered 2012-01-17 – 2012-01-22 (×11): 1250 mg via INTRAVENOUS
  Filled 2012-01-17 (×12): qty 1250

## 2012-01-17 MED ORDER — VANCOMYCIN HCL IN DEXTROSE 1-5 GM/200ML-% IV SOLN: 1000.0000 mg | Freq: Two times a day (BID) | INTRAVENOUS | Status: DC

## 2012-01-17 MED ORDER — WARFARIN SODIUM 4 MG PO TABS
4.0000 mg | ORAL_TABLET | Freq: Every day | ORAL | Status: DC
  Administered 2012-01-17 – 2012-01-20 (×4): 4 mg via ORAL
  Filled 2012-01-17 (×5): qty 1

## 2012-01-17 NOTE — Progress Notes (Signed)
CARDIAC REHAB PHASE I   PRE:  Rate/Rhythm: 73SR  BP:  Supine:   Sitting: 126/70  Standing:    SaO2: 97%RA  MODE:  Ambulation: 460 ft   POST:  Rate/Rhythem: 91  BP:  Supine:   Sitting: 150/70  Standing:    SaO2: 93%RA 0945-1013 We did not get order for pt. Progression RN notified me of need to see pt and she put order in. Pt tolerated walk well. Walked 460 ft on RA with rolling walker and minimal asst. To recliner after walk. Sats good on RA.  Duanne Limerick

## 2012-01-17 NOTE — Progress Notes (Signed)
PATIENT AMB. IN HALL ON R.A. 250 FEET TOL WELL SET IN CHAIR FOR 2 HRS.

## 2012-01-17 NOTE — Progress Notes (Signed)
Changed pt's dressing on midsternal incision 3x's on day shift. Each time dressing saturated with blood. Applied betadine to the site and applied dry clean gauze dressing.

## 2012-01-17 NOTE — Progress Notes (Signed)
Painted pt's incisions with betadine. 

## 2012-01-17 NOTE — Progress Notes (Signed)
PATIENT AMB. IN CHAIR 250 FT WITH WALKER TOL. WELL.

## 2012-01-17 NOTE — Progress Notes (Signed)
Re-dressed pt's midsternal dressing. Old dressing was saturated with blood again. Will continue to monitor

## 2012-01-17 NOTE — Progress Notes (Addendum)
[address]            [place] [address]          [phone]     5 Days Post-Op  Procedure(s) (LRB): REDO CORONARY ARTERY BYPASS GRAFTING (CABG) (N/A) AORTIC VALVE REPLACEMENT (AVR) (N/A) MAZE (N/A) Subjective: Feels better, less SOB, having serosang drainage from lower portion of sternal incision.   Objective  Telemetry SR  Temp:  [98.2 F (36.8 C)-99.4 F (37.4 C)] 98.2 F (36.8 C) (02/12 2952) Pulse Rate:  [62-74] 72  (02/12 0632) Resp:  [16-28] 20  (02/12 0632) BP: (111-149)/(58-80) 149/80 mmHg (02/12 0632) SpO2:  [91 %-95 %] 91 % (02/12 0632) Weight:  [216 lb 9.6 oz (98.249 kg)] 216 lb 9.6 oz (98.249 kg) (02/12 8413)   Intake/Output Summary (Last 24 hours) at 01/17/12 0815 Last data filed at 01/17/12 0600  Gross per 24 hour  Intake    240 ml  Output   1325 ml  Net  -1085 ml       General appearance: alert, cooperative and no distress Heart: regular rate and rhythm and S1, S2 normal Lungs: mild diminished in bases Abdomen: soft, non-tender; bowel sounds normal; no masses,  no organomegaly Extremities: BLE edema Wound: sternum stable without click, no erethema, + serosang drainage  Lab Results:  [facility] 01/17/12 0615 01/15/12 0453  NA 139 134*  K 3.8 3.8  CL 103 101  CO2 26 27  GLUCOSE 103* 110*  BUN 36* 34*  CREATININE 1.03 1.14  CALCIUM 9.0 8.2*  MG -- --  PHOS -- --   No results found for this basename: AST:2,ALT:2,ALKPHOS:2,BILITOT:2,PROT:2,ALBUMIN:2 in the last 72 hours No results found for this basename: LIPASE:2,AMYLASE:2 in the last 72 hours  Basename 01/17/12 0615 01/15/12 0453  WBC 17.5* 22.5*  NEUTROABS -- --  HGB 7.4* 7.1*  HCT 21.8* 20.5*  MCV 92.0 90.7  PLT 254 124*   No results found for this basename: CKTOTAL:4,CKMB:4,TROPONINI:4 in the last 72 hours No components found with this basename: POCBNP:3 No results found for this basename: DDIMER in the last 72 hours No results found for this  basename: HGBA1C in the last 72 hours No results found for this basename: CHOL,HDL,LDLCALC,TRIG,CHOLHDL in the last 72 hours No results found for this basename: TSH,T4TOTAL,FREET3,T3FREE,THYROIDAB in the last 72 hours No results found for this basename: VITAMINB12,FOLATE,FERRITIN,TIBC,IRON,RETICCTPCT in the last 72 hours  Medications: Scheduled    . acetaminophen  1,000 mg Oral Q6H  . amiodarone  200 mg Oral BID PC  . aspirin EC  325 mg Oral Daily  . bisacodyl  10 mg Oral Daily   Or  . bisacodyl  10 mg Rectal Daily  . docusate sodium  200 mg Oral Daily  . furosemide  40 mg Intravenous Once  . furosemide  40 mg Oral BID  . glyBURIDE  5 mg Oral BID WC  . insulin aspart  0-15 Units Subcutaneous TID WC  . insulin aspart  0-5 Units Subcutaneous QHS  . levothyroxine  50 mcg Oral QAC breakfast  . lisinopril  10 mg Oral Daily  . metFORMIN  1,000 mg Oral BID WC  . metoprolol tartrate  25 mg Oral BID  . pantoprazole  40 mg Oral Q1200  . polysaccharide iron  150 mg Oral Daily  . potassium chloride  20 mEq Oral BID  . warfarin  2.5 mg Oral q1800  .  DISCONTD: furosemide  40 mg Oral Daily  . DISCONTD: insulin glargine  20 Units Subcutaneous BID     Radiology/Studies:  No results found.  INR:1.63 Will add last result for INR, ABG once components are confirmed Will add last 4 CBG results once components are confirmed  Assessment/Plan: S/P Procedure(s) (LRB): REDO CORONARY ARTERY BYPASS GRAFTING (CABG) (N/A) AORTIC VALVE REPLACEMENT (AVR) (N/A) MAZE (N/A)  1. Doing well, but with sternal drainage. Possibly fatty necrosis. D/W Dr Cornelius Moras , will culture drainage and start on IV Vanco as a precaution. Leukocytosis improving trend 2. Cont AC RX, increase to 4 mg 3. Cont diuresis 4. Cont pulm toilet, cardiac rehab 5. CBG 62 to 135 range  LOS: 5 days    [name],[name] 2/12/20138:15 AM    I have seen and examined the patient and agree with the assessment and plan as  outlined.  Tiny Rietz H 01/17/2012 7:27 PM

## 2012-01-17 NOTE — Progress Notes (Signed)
Pt ambulated 3x's today. Tolerated well, sats good on RA. Pt says he is feeling better today

## 2012-01-17 NOTE — Progress Notes (Signed)
Changed pt's midsternal dressing this am. Night shift changed dressing at 0700. Dressing completely saturated with blood. MD aware of continuous drainage. Will continue to monitor and change dressing as needed. No complaints of pain from patient.

## 2012-01-17 NOTE — Progress Notes (Signed)
ANTIBIOTIC CONSULT NOTE - INITIAL  Pharmacy Consult for vancomycin Indication: possible sternal incision infection  No Known Allergies  Patient Measurements: Height: 5\' 8"  (172.7 cm) Weight: 216 lb 9.6 oz (98.249 kg) IBW/kg (Calculated) : 68.4   Vital Signs: Temp: 98.2 F (36.8 C) (02/12 0632) Temp src: Oral (02/12 0632) BP: 149/80 mmHg (02/12 0632) Pulse Rate: 72  (02/12 0632) Intake/Output from previous day: 02/11 0701 - 02/12 0700 In: 240 [P.O.:240] Out: 1325 [Urine:1325]  Labs:  [facility] 01/17/12 0615 01/15/12 0453  WBC 17.5* 22.5*  HGB 7.4* 7.1*  PLT 254 124*  LABCREA -- --  CREATININE 1.03 1.14   Estimated Creatinine Clearance: 85.5 ml/min (by C-G formula based on Cr of 1.03). No results found for this basename: VANCOTROUGH:2,VANCOPEAK:2,VANCORANDOM:2,GENTTROUGH:2,GENTPEAK:2,GENTRANDOM:2,TOBRATROUGH:2,TOBRAPEAK:2,TOBRARND:2,AMIKACINPEAK:2,AMIKACINTROU:2,AMIKACIN:2, in the last 72 hours   Microbiology: Recent Results (from the past 720 hour(s))  SURGICAL PCR SCREEN     Status: Abnormal   Collection Time   01/09/12  1:05 PM      Component Value Range Status Comment   MRSA, PCR NEGATIVE  NEGATIVE  Final    Staphylococcus aureus POSITIVE (*) NEGATIVE  Final   URINE CULTURE     Status: Normal   Collection Time   01/15/12  9:46 AM      Component Value Range Status Comment   Specimen Description URINE, CLEAN CATCH   Final    Special Requests NONE   Final    Culture  Setup Time [id number]   Final    Colony Count NO GROWTH   Final    Culture NO GROWTH   Final    Report Status 01/16/2012 FINAL   Final     Medical History: Past Medical History  Diagnosis Date  . Mixed hyperlipidemia   . Coronary atherosclerosis of native coronary artery     2 vessel s/p CABG  . Type 2 diabetes mellitus   . Aortic stenosis     Moderate  . Myocardial infarction 1998  . Essential hypertension, benign     on medicaton  . Atrial fibrillation 10/2011    unsuccessful CV 11/2011    . Heart murmur     aortic stenosis  . Shortness of breath     exertional  . Sleep apnea 2010    wears CPAP nightly  . CHF (congestive heart failure) 11/2011    on Lasix to control edema  . Pneumonia 2010  . Diabetes mellitus     Type 2 NIDDM  X 10 years  . Hypothyroidism     on medication  . H/O hiatal hernia   . Arthritis     osteoarthritis  . Rotator cuff tear     Rt shoulder    Medications:  Prescriptions prior to admission  Medication Sig Dispense Refill  . amLODipine-atorvastatin (CADUET) 5-20 MG per tablet Take 1 tablet by mouth daily.       Marland Kitchen glyBURIDE-metformin (GLUCOVANCE) 2.5-500 MG per tablet Take 2 tablets by mouth 2 (two) times daily with a meal.       . levothyroxine (SYNTHROID, LEVOTHROID) 50 MCG tablet Take 50 mcg by mouth daily.       . nitroGLYCERIN (NITROSTAT) 0.4 MG SL tablet Place 0.4 mg under the tongue every 5 (five) minutes as needed. For chest pain      . VOLTAREN 1 % GEL Apply 1 application topically 4 (four) times daily as needed. For pain      . DISCONTD: amiodarone (PACERONE) 200 MG tablet Take 200 mg by mouth daily.      Marland Kitchen  DISCONTD: aspirin EC 81 MG tablet Take 81 mg by mouth daily.      Marland Kitchen DISCONTD: etodolac (LODINE) 500 MG tablet Take 500 mg by mouth 2 (two) times daily.       Marland Kitchen DISCONTD: furosemide (LASIX) 20 MG tablet Take 20 mg by mouth daily.      Marland Kitchen DISCONTD: isosorbide mononitrate (IMDUR) 30 MG 24 hr tablet Take 30 mg by mouth daily.       Marland Kitchen DISCONTD: metoprolol succinate (TOPROL-XL) 25 MG 24 hr tablet Take 25 mg by mouth daily.      Marland Kitchen DISCONTD: warfarin (COUMADIN) 5 MG tablet Take 2.5-10 mg by mouth every evening. 2 tablets (10mg ) Monday and Friday; 0.5 tablet (2.5mg ) the rest of the week       Assessment: 60 yom POD#5 s/p CABG, AVR, MAZE. Pt is having drainage from lower portion of sternal incision so will start empiric vancomycin. WBC is elevated but starting to trend down. Pt has been afebrile.   Goal of Therapy:  Vancomycin trough  level 10-15 mcg/ml  Plan:  Measure antibiotic drug levels at steady state Follow up culture results Vancomycin 1250mg  IV Q12H  Kauan Kloosterman, Drake Leach 01/17/2012,10:12 AM

## 2012-01-18 LAB — GLUCOSE, CAPILLARY
Glucose-Capillary: 108 mg/dL — ABNORMAL HIGH (ref 70–99)
Glucose-Capillary: 114 mg/dL — ABNORMAL HIGH (ref 70–99)
Glucose-Capillary: 142 mg/dL — ABNORMAL HIGH (ref 70–99)

## 2012-01-18 LAB — PROTIME-INR
INR: 1.76 — ABNORMAL HIGH (ref 0.00–1.49)
Prothrombin Time: 20.8 seconds — ABNORMAL HIGH (ref 11.6–15.2)

## 2012-01-18 MED ORDER — POTASSIUM CHLORIDE CRYS ER 20 MEQ PO TBCR
40.0000 meq | EXTENDED_RELEASE_TABLET | Freq: Once | ORAL | Status: AC
  Administered 2012-01-18: 40 meq via ORAL

## 2012-01-18 MED ORDER — FOLIC ACID 1 MG PO TABS
1.0000 mg | ORAL_TABLET | Freq: Every day | ORAL | Status: DC
  Administered 2012-01-18 – 2012-01-23 (×6): 1 mg via ORAL
  Filled 2012-01-18 (×7): qty 1

## 2012-01-18 MED ORDER — POLYSACCHARIDE IRON 150 MG PO CAPS: 150.0000 mg | ORAL_CAPSULE | Freq: Every day | ORAL | Status: DC

## 2012-01-18 NOTE — Progress Notes (Addendum)
[address]            [place] [address]          [phone]     6 Days Post-Op  Procedure(s) (LRB): REDO CORONARY ARTERY BYPASS GRAFTING (CABG) (N/A) AORTIC VALVE REPLACEMENT (AVR) (N/A) MAZE (N/A) Subjective: Feels well  Objective  Telemetry NSR  Temp:  [97.5 F (36.4 C)-98.3 F (36.8 C)] 97.5 F (36.4 C) (02/13 0507) Pulse Rate:  [71-78] 71  (02/13 0507) Resp:  [20-22] 22  (02/13 0507) BP: (113-138)/(69-81) 130/76 mmHg (02/13 0507) SpO2:  [93 %-96 %] 95 % (02/13 0507) Weight:  [214 lb 15.2 oz (97.5 kg)] 214 lb 15.2 oz (97.5 kg) (02/13 0507)   Intake/Output Summary (Last 24 hours) at 01/18/12 0910 Last data filed at 01/18/12 0513  Gross per 24 hour  Intake    720 ml  Output   1425 ml  Net   -705 ml   Physical Examination: General appearance - alert, well appearing, and in no distress Chest - clear to auscultation, no wheezes, rales or rhonchi, symmetric air entry Heart -RRR, + valve click Abdomen - soft, nontender, nondistended, no masses or organomegaly Extremities - + BLE edema Skin -incisions healing well, but still a fair amount of serosanguinous drainage from lower sternal incision   Lab Results:  Basename 01/17/12 0615  NA 139  K 3.8  CL 103  CO2 26  GLUCOSE 103*  BUN 36*  CREATININE 1.03  CALCIUM 9.0  MG --  PHOS --   No results found for this basename: AST:2,ALT:2,ALKPHOS:2,BILITOT:2,PROT:2,ALBUMIN:2 in the last 72 hours No results found for this basename: LIPASE:2,AMYLASE:2 in the last 72 hours  Basename 01/17/12 0615  WBC 17.5*  NEUTROABS --  HGB 7.4*  HCT 21.8*  MCV 92.0  PLT 254   No results found for this basename: CKTOTAL:4,CKMB:4,TROPONINI:4 in the last 72 hours No components found with this basename: POCBNP:3 No results found for this basename: DDIMER in the last 72 hours No results found for this basename: HGBA1C in the last 72 hours No results found for this basename:  CHOL,HDL,LDLCALC,TRIG,CHOLHDL in the last 72 hours No results found for this basename: TSH,T4TOTAL,FREET3,T3FREE,THYROIDAB in the last 72 hours No results found for this basename: VITAMINB12,FOLATE,FERRITIN,TIBC,IRON,RETICCTPCT in the last 72 hours  Medications: Scheduled    . acetaminophen  1,000 mg Oral Q6H  . amiodarone  200 mg Oral BID PC  . aspirin EC  325 mg Oral Daily  . bisacodyl  10 mg Oral Daily   Or  . bisacodyl  10 mg Rectal Daily  . docusate sodium  200 mg Oral Daily  . furosemide  40 mg Oral BID  . glyBURIDE  5 mg Oral BID WC  . insulin aspart  0-15 Units Subcutaneous TID WC  . insulin aspart  0-5 Units Subcutaneous QHS  . levothyroxine  50 mcg Oral QAC breakfast  . lisinopril  10 mg Oral Daily  . metFORMIN  1,000 mg Oral BID WC  . metoprolol tartrate  25 mg Oral BID  . pantoprazole  40 mg Oral Q1200  . polysaccharide iron  150 mg Oral Daily  . potassium chloride  20 mEq Oral BID  . vancomycin  1,250 mg Intravenous Q12H  . warfarin  4 mg Oral q1800  . DISCONTD: vancomycin  1,000 mg Intravenous Q12H  . DISCONTD: warfarin  2.5 mg Oral q1800  Radiology/Studies:  No results found.  INR: Will add last result for INR, ABG once components are confirmed Will add last 4 CBG results once components are confirmed  Assessment/Plan: S/P Procedure(s) (LRB): REDO CORONARY ARTERY BYPASS GRAFTING (CABG) (N/A) AORTIC VALVE REPLACEMENT (AVR) (N/A) MAZE (N/A)  1. Stable, cont current rx, culture neg so far.    LOS: 6 days    GOLD,WAYNE E 2/13/20139:10 AM   I have seen and examined the patient and agree with the assessment and plan as outlined.  Add iron and folic acid for acute blood loss anemia.  Supplement K+  Donetta Isaza H 01/18/2012 10:09 AM

## 2012-01-18 NOTE — Progress Notes (Signed)
Pt ambulated with wife and walker on RA >519ft, tolerated well, back to room with call bell in reach.  Will continue to monitor and encourage. Ave Filter

## 2012-01-18 NOTE — Progress Notes (Signed)
Pt ambulated 500 ft wioth wife and walker on RA, tolerated well, back to room with call bell in reach, will continue to monitor and encourage. Ave Filter

## 2012-01-18 NOTE — Progress Notes (Signed)
Cardiac Rehab 1110 Pt walked with family. Getting bath now. Will continue to follow. Waldine Zenz DunlapRN

## 2012-01-18 NOTE — Progress Notes (Signed)
[id number] Cardiac Rehab Completed discharge education with pt and wife. He agrees to [facility]. CRP in [place], will send referral.

## 2012-01-19 LAB — PROTIME-INR
INR: 2.15 — ABNORMAL HIGH (ref 0.00–1.49)
Prothrombin Time: 24.4 seconds — ABNORMAL HIGH (ref 11.6–15.2)

## 2012-01-19 LAB — GLUCOSE, CAPILLARY
Glucose-Capillary: 101 mg/dL — ABNORMAL HIGH (ref 70–99)
Glucose-Capillary: 118 mg/dL — ABNORMAL HIGH (ref 70–99)
Glucose-Capillary: 50 mg/dL — ABNORMAL LOW (ref 70–99)
Glucose-Capillary: 87 mg/dL (ref 70–99)

## 2012-01-19 MED ORDER — POTASSIUM CHLORIDE CRYS ER 20 MEQ PO TBCR: 20.0000 meq | EXTENDED_RELEASE_TABLET | Freq: Every day | ORAL | Status: DC

## 2012-01-19 MED ORDER — GLUCOSE 40 % PO GEL
ORAL | Status: AC
  Administered 2012-01-19: 37.5 g via ORAL
  Filled 2012-01-19: qty 1

## 2012-01-19 MED ORDER — FUROSEMIDE 40 MG PO TABS: 40.0000 mg | ORAL_TABLET | Freq: Every day | ORAL | Status: DC

## 2012-01-19 MED ORDER — FOLIC ACID 1 MG PO TABS: 1.0000 mg | ORAL_TABLET | Freq: Every day | ORAL | Status: AC

## 2012-01-19 NOTE — Progress Notes (Signed)
Pt ambulated 750 independently with walker on RA.  Tolerated well, slightly SOB, back to room with call bell in reach.  Will continue to monitor and encourage. Ave Filter

## 2012-01-19 NOTE — Progress Notes (Signed)
Pt ambulated independently with walker on RA >500 ft.  Tolerated well, slightly SOB, back to bed with call bell in reach.  Will continue to monitor and encourage. Ave Filter

## 2012-01-19 NOTE — Progress Notes (Addendum)
[address]            [place] [address]          [phone]     7 Days Post-Op  Procedure(s) (LRB): REDO CORONARY ARTERY BYPASS GRAFTING (CABG) (N/A) AORTIC VALVE REPLACEMENT (AVR) (N/A) MAZE (N/A) Subjective: Feels well, now having some serosang drainage from the cephalad portion of the sternal incision  Objective  Telemetry NSR  Temp:  [97.7 F (36.5 C)-98.9 F (37.2 C)] 98.9 F (37.2 C) (02/14 0624) Pulse Rate:  [69-76] 69  (02/14 0624) Resp:  [16-20] 16  (02/14 0624) BP: (112-143)/(70-79) 140/79 mmHg (02/14 0624) SpO2:  [90 %-95 %] 93 % (02/14 0624) Weight:  [214 lb 1.1 oz (97.1 kg)] 214 lb 1.1 oz (97.1 kg) (02/14 0538)   Intake/Output Summary (Last 24 hours) at 01/19/12 0810 Last data filed at 01/19/12 0539  Gross per 24 hour  Intake    240 ml  Output   1175 ml  Net   -935 ml       General appearance: alert, cooperative and no distress Heart: regular rate and rhythm and S1, S2 normal Lungs: clear to auscultation bilaterally Abdomen: soft, mild distension Extremities: 2+ BLE edema Wound: still with drainage lower and upper pole of sternal incision  Lab Results:  Basename 01/17/12 0615  NA 139  K 3.8  CL 103  CO2 26  GLUCOSE 103*  BUN 36*  CREATININE 1.03  CALCIUM 9.0  MG --  PHOS --   No results found for this basename: AST:2,ALT:2,ALKPHOS:2,BILITOT:2,PROT:2,ALBUMIN:2 in the last 72 hours No results found for this basename: LIPASE:2,AMYLASE:2 in the last 72 hours  Basename 01/17/12 0615  WBC 17.5*  NEUTROABS --  HGB 7.4*  HCT 21.8*  MCV 92.0  PLT 254   No results found for this basename: CKTOTAL:4,CKMB:4,TROPONINI:4 in the last 72 hours No components found with this basename: POCBNP:3 No results found for this basename: DDIMER in the last 72 hours No results found for this basename: HGBA1C in the last 72 hours No results found for this basename: CHOL,HDL,LDLCALC,TRIG,CHOLHDL in the last 72 hours No  results found for this basename: TSH,T4TOTAL,FREET3,T3FREE,THYROIDAB in the last 72 hours No results found for this basename: VITAMINB12,FOLATE,FERRITIN,TIBC,IRON,RETICCTPCT in the last 72 hours  Medications: Scheduled    . amiodarone  200 mg Oral BID PC  . aspirin EC  325 mg Oral Daily  . bisacodyl  10 mg Oral Daily   Or  . bisacodyl  10 mg Rectal Daily  . docusate sodium  200 mg Oral Daily  . folic acid  1 mg Oral Daily  . furosemide  40 mg Oral BID  . glyBURIDE  5 mg Oral BID WC  . insulin aspart  0-15 Units Subcutaneous TID WC  . insulin aspart  0-5 Units Subcutaneous QHS  . levothyroxine  50 mcg Oral QAC breakfast  . lisinopril  10 mg Oral Daily  . metFORMIN  1,000 mg Oral BID WC  . metoprolol tartrate  25 mg Oral BID  . pantoprazole  40 mg Oral Q1200  . polysaccharide iron  150 mg Oral Daily  . potassium chloride  20 mEq Oral BID  . potassium chloride  40 mEq Oral Once  . vancomycin  1,250 mg Intravenous Q12H  . warfarin  4 mg Oral q1800  . DISCONTD: polysaccharide iron  150 mg Oral Daily  Radiology/Studies:  No results found.  INR:2.15 Will add last result for INR, ABG once components are confirmed Will add last 4 CBG results once components are confirmed  Assessment/Plan: S/P Procedure(s) (LRB): REDO CORONARY ARTERY BYPASS GRAFTING (CABG) (N/A) AORTIC VALVE REPLACEMENT (AVR) (N/A) MAZE (N/A)  1. Cont iv vanco, poss convert to po abx soon, recheck cbc 2. Cont ac rx 3. cbg 101-142 range 4. On iron, folate 5 cont diuresis with bid lasix, recheck bmet in am  LOS: 7 days    GOLD,WAYNE E 2/14/20138:10 AM    I have seen and examined the patient and agree with the assessment and plan as outlined.  Tiny drop of drainage at top last night, dressing dry and crusty.  Drainage from inferior end of incision has decreased.  Wound culture from 2/12 no growth.  Will continue IV vanc until drainage decreased to a point where the likelihood of developing a wound  infection is minimal.  Brixton Franko H 01/19/2012 9:53 AM

## 2012-01-19 NOTE — Progress Notes (Signed)
Midsternal dressing changed, serosang drainage present on bandage, no odor, incision site looks good.  No indication of infection.  New 4x4 applied with betadine, will continue to monitor closely. Ave Filter

## 2012-01-19 NOTE — Discharge Summary (Signed)
I agree with the above discharge summary and plan for follow-up.  Keri Veale H  

## 2012-01-19 NOTE — Progress Notes (Signed)
1200 Cardiac Rehab Pt is ambulating in hall independently in hall. Discharge education completed, will sign off.

## 2012-01-19 NOTE — Discharge Instructions (Signed)
Coronary Artery Bypass Grafting Care After Refer to this sheet in the next few weeks. These instructions provide you with information on caring for yourself after your procedure. Your caregiver may also give you more specific instructions. Your treatment has been planned according to current medical practices, but problems sometimes occur. Call your caregiver if you have any problems or questions after your procedure.  Recovery from open heart surgery will be different for everyone. Some people feel well after 3 or 4 weeks, while for others it takes longer. After heart surgery, it may be normal to:  Not have an appetite, feel nauseated by the smell of food, or only want to eat a small amount.   Be constipated because of changes in your diet, activity, and medicines. Eat foods high in fiber. Add fresh fruits and vegetables to your diet. Stool softeners may be helpful.   Feel sad or unhappy. You may be frustrated or cranky. You may have good days and bad days. Do not give up. Talk to your caregiver if you do not feel better.   Feel weakness and fatigue. You many need physical therapy or cardiac rehabilitation to get your strength back.   Develop an irregular heartbeat called atrial fibrillation. Symptoms of atrial fibrillation are a fast, irregular heartbeat or feelings of fluttery heartbeats, shortness of breath, low blood pressure, and dizziness. If these symptoms develop, see your caregiver right away.  MEDICATION  Have a list of all the medicines you will be taking when you leave the hospital. For every medicine, know the following:   Name.   Exact dose.   Time of day to be taken.   How often it should be taken.   Why you are taking it.   Ask which medicines should or should not be taken together. If you take more than one heart medicine, ask if it is okay to take them together. Some heart medicines should not be taken at the same time because they may lower your blood pressure too  much.   Narcotic pain medicine can cause constipation. Eat fresh fruits and vegetables. Add fiber to your diet. Stool softener medicine may help relieve constipation.   Keep a copy of your medicines with you at all times.   Do not add or stop taking any medicine until you check with your caregiver.   Medicines can have side effects. Call your caregiver who prescribed the medicine if you:   Start throwing up, have diarrhea, or have stomach pain.   Feel dizzy or lightheaded when you stand up.   Feel your heart is skipping beats or is beating too fast or too slow.   Develop a rash.   Notice unusual bruising or bleeding.  HOME CARE INSTRUCTIONS  After heart surgery, it is important to learn how to take your pulse. Have your caregiver show you how to take your pulse.   Use your incentive spirometer. Ask your caregiver how long after surgery you need to use it.  Care of your chest incision  Tell your caregiver right away if you notice clicking in your chest (sternum).   Support your chest with a pillow or your arms when you take deep breaths and cough.   Follow your caregiver's instructions about when you can bathe or swim.   Protect your incision from sunlight during the first year to keep the scar from getting dark.   Tell your caregiver if you notice:   Increased tenderness of your incision.   Increased redness   or swelling around your incision.   Drainage or pus from your incision.  Care of your leg incision(s)  Avoid crossing your legs.   Avoid sitting for long periods of time. Change positions every half hour.   Elevate your leg(s) when you are sitting.   Check your leg(s) daily for swelling. Check the incisions for redness or drainage.   Wear your elastic stockings as told by your caregiver. Take them off at bedtime.  Diet  Diet is very important to heart health.   Eat plenty of fresh fruits and vegetables. Meats should be lean cut. Avoid canned, processed, and  fried foods.   Talk to a dietician. They can teach you how to make healthy food and drink choices.  Weight  Weigh yourself every day. This is important because it helps to know if you are retaining fluid that may make your heart and lungs work harder.   Use the same scale each time.   Weigh yourself every morning at the same time. You should do this after you go to the bathroom, but before you eat breakfast.   Your weight will be more accurate if you do not wear any clothes.   Record your weight.   Tell your caregiver if you have gained 2 pounds or more overnight.  Activity Stop any activity at once if you have chest pain, shortness of breath, irregular heartbeats, or dizziness. Get help right away if you have any of these symptoms.  Bathing.  Avoid soaking in a bath or hot tub until your incisions are healed.   Rest. You need a balance of rest and activity.   Exercise. Exercise per your caregiver's advice. You may need physical therapy or cardiac rehabilitation to help strengthen your muscles and build your endurance.   Climbing stairs. Unless your caregiver tells you not to climb stairs, go up stairs slowly and rest if you tire. Do not pull yourself up by the handrail.   Driving a car. Follow your caregiver's advice on when you may drive. You may ride as a passenger at any time. When traveling for long periods of time in a car, get out of the car and walk around for a few minutes every 2 hours.   Lifting. Avoid lifting, pushing, or pulling anything heavier than 10 pounds for 6 weeks after surgery or as told by your caregiver.   Returning to work. Check with your caregiver. People heal at different rates. Most people will be able to go back to work 6 to 12 weeks after surgery.   Sexual activity. You may resume sexual relations as told by your caregiver.  SEEK MEDICAL CARE IF:  Any of your incisions are red, painful, or have any type of drainage coming from them.   You have an  oral temperature above 102 F (38.9 C).   You have ankle or leg swelling.   You have pain in your legs.   You have weight gain of 2 or more pounds a day.   You feel dizzy or lightheaded when you stand up.  SEEK IMMEDIATE MEDICAL CARE IF:  You have angina or chest pain that goes to your jaw or arms. Call your local emergency services right away.   You have shortness of breath at rest or with activity.   You have a fast or irregular heartbeat (arrhythmia).   There is a "clicking" in your sternum when you move.   You have numbness or weakness in your arms or legs.    MAKE SURE YOU:  Understand these instructions.   Will watch your condition.   Will get help right away if you are not doing well or get worse.  Document Released: 06/10/2005 Document Revised: 08/03/2011 Document Reviewed: 01/26/2011 [employer] Patient Information 2012 [employer].Aortic Valve Replacement Care After Read the instructions outlined below and refer to this sheet for the next few weeks. These discharge instructions provide you with general information on caring for yourself after you leave the hospital. Your surgeon may also give you specific instructions. While your treatment has been planned according to the most current medical practices available, unavoidable complications occasionally occur. If you have any problems or questions after discharge, please call your surgeon. AFTER THE PROCEDURE  Full recovery from heart valve surgery can take several months.   Blood thinning (anticoagulation) treatment with warfarin is often prescribed for 6 weeks to 3 months after surgery for those with biological valves. It is prescribed for life for those with mechanical valves.   Recovery includes healing of the surgical incision. There is a gradual building of stamina and exercise abilities. An exercise program under the direction of a physical therapist may be recommended.   Once you have an artificial valve, your  heart function and your life will return to normal. You usually feel better after surgery. Shortness of breath and fatigue should lessen. If your heart was already severely damaged before your surgery, you may continue to have problems.   You can usually resume most of your normal activities. You will have to continue to monitor your condition. You need to watch out for blood clots and infections.   Artificial valves need to be replaced after a period of time. It is important that you see your caregiver regularly.   Some individuals with an aortic valve replacement need to take antibiotics before having dental work or other surgical procedures. This is called prophylactic antibiotic treatment. These drugs help to prevent infective endocarditis. Antibiotics are only recommended for individuals with the highest risk for developing infective endocarditis. Let your dentist and your caregiver know if you have a history of any of the following so that the necessary precautions can be taken:   A VSD.   A repaired VSD.   Endocarditis in the past.   An artificial (prosthetic) heart valve.  HOME CARE INSTRUCTIONS   Use all medications as prescribed.   Take your temperature every morning for the first week after surgery. Record these.   Weigh yourself every morning for at least the first week after surgery and record.   Do not lift more than 10 pounds (4.5 kg) until your breastbone (sternum) has healed. Avoid all activities which would place strain on your incision.   You may shower as soon as directed by your caregiver after surgery. Pat incisions dry. Do not rub incisions with washcloth or towel.   Avoid driving for 4 to 6 weeks following surgery or as instructed.   Use your elastic stockings during the day. You should wear the stockings for at least 2 weeks after discharge or longer if your ankles are swollen. The stockings help blood flow and help reduce swelling in the legs. It is easiest to  put the stockings on before you get out of bed in the morning. They should fit snugly.  Pain Control  If a prescription was given for a pain reliever, please follow your doctor's directions.   If the pain is not relieved by your medicine, becomes worse, or you have difficulty   breathing, call your surgeon.  Activity  Take frequent rest periods throughout the day.   Wait one week before returning to strenuous activities such as heavy lifting (more than 10 pounds), pushing or pulling.   Talk with your doctor about when you may return to work and your exercise routine.   Do not drive while taking prescription pain medication.  Nutrition  You may resume your normal diet.   Drink plenty of fluids (6-8 glasses a day).   Eat a well-balanced diet.   Call your caregiver for persistent nausea or vomiting.  Elimination Your normal bowel function should return. If constipation should occur, you may:  Take a mild laxative.   Add fruit and bran to your diet.   Drink more fluids.   Call your doctor if constipation is not relieved.  SEEK IMMEDIATE MEDICAL CARE IF:   You develop chest pain which is not coming from your surgical cut (incision).   You develop shortness of breath or have difficulty breathing.   You develop a temperature over 101 F (38.3 C).   You have a sudden weight gain. Let your caregiver know what the weight gain is.   You develop a rash.   You develop any reaction or side effects to medications given.   You have increased bleeding from wounds.   You see redness, swelling, or have increasing pain in wounds.   You have pus coming from your wound.   You develop lightheadedness or feel faint.  Document Released: 06/09/2005 Document Revised: 08/03/2011 Document Reviewed: 08/31/2005 [employer] Patient Information 2012 [employer]. 

## 2012-01-19 NOTE — Progress Notes (Signed)
Pt ambulated >500 ft with family members and walker on RA.  Tolerated well, slightly SOB, back to recliner with call bell in reach. No pain noted, VSS, will continue to monitor and encourage. Ave Filter

## 2012-01-19 NOTE — Discharge Summary (Signed)
Addendum Note to D/C KHAIRI GARMAN is a 61 y.o. 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  Patient had some serous sanguinous drainage from the lower and later proximal portions of his sternal wound. Was started on IV vancomycin. White blood cell count was down to 17,500. A CBC will be checked in the morning prior to discharge. A sternal wound culture was obtained. Preliminary results showed no white blood cells, no organisms, or no growth. He was also found have acute blood loss anemia. His H&H went down to 7.4 and 21.8. He was started on Nu-Iron as well as folic acid, which will be continued upon discharge. The patient is stable and ready for discharge in the am.  No changes to History or  Physical Exam. The only change made to his medications was the addition of folic acid 1 mg by mouth daily and the continuation of Lasix 40 mg po daily (until instructed to stop) and Potassium chloride 20 meq po daily (until instructed to stop).  Ardelle Balls PA-C 01/19/2012 12:43 PM

## 2012-01-19 NOTE — Progress Notes (Signed)
Midsternal dressing applied.  Scant dark red blood at unapproximated site.  4x4s and tape applied with betadine.  Will continue to monitor. Ave Filter

## 2012-01-20 ENCOUNTER — Inpatient Hospital Stay (HOSPITAL_COMMUNITY): Payer: BC Managed Care – PPO

## 2012-01-20 LAB — CBC
Hemoglobin: 7 g/dL — ABNORMAL LOW (ref 13.0–17.0)
MCH: 29.9 pg (ref 26.0–34.0)
MCHC: 31.8 g/dL (ref 30.0–36.0)
MCV: 94 fL (ref 78.0–100.0)
Platelets: 366 10*3/uL (ref 150–400)

## 2012-01-20 LAB — PROTIME-INR
INR: 2.63 — ABNORMAL HIGH (ref 0.00–1.49)
Prothrombin Time: 28.5 seconds — ABNORMAL HIGH (ref 11.6–15.2)

## 2012-01-20 LAB — GLUCOSE, CAPILLARY
Glucose-Capillary: 105 mg/dL — ABNORMAL HIGH (ref 70–99)
Glucose-Capillary: 144 mg/dL — ABNORMAL HIGH (ref 70–99)
Glucose-Capillary: 86 mg/dL (ref 70–99)

## 2012-01-20 LAB — VANCOMYCIN, TROUGH: Vancomycin Tr: 15.8 ug/mL (ref 10.0–20.0)

## 2012-01-20 LAB — BASIC METABOLIC PANEL
BUN: 23 mg/dL (ref 6–23)
Calcium: 9.3 mg/dL (ref 8.4–10.5)
GFR calc non Af Amer: 72 mL/min — ABNORMAL LOW (ref >90)
Glucose, Bld: 100 mg/dL — ABNORMAL HIGH (ref 70–99)

## 2012-01-20 LAB — WOUND CULTURE: Gram Stain: NONE SEEN

## 2012-01-20 MED ORDER — MOXIFLOXACIN HCL 400 MG PO TABS
400.0000 mg | ORAL_TABLET | Freq: Every day | ORAL | Status: DC
  Administered 2012-01-20 – 2012-01-22 (×3): 400 mg via ORAL
  Filled 2012-01-20 (×4): qty 1

## 2012-01-20 MED ORDER — BOOST / RESOURCE BREEZE PO LIQD
1.0000 | Freq: Every day | ORAL | Status: DC
  Administered 2012-01-21 – 2012-01-22 (×2): 1 via ORAL

## 2012-01-20 MED ORDER — FUROSEMIDE 10 MG/ML IJ SOLN
20.0000 mg | Freq: Once | INTRAMUSCULAR | Status: AC
  Administered 2012-01-21: 20 mg via INTRAVENOUS
  Filled 2012-01-20 (×2): qty 2

## 2012-01-20 MED ORDER — GLYBURIDE 5 MG PO TABS
5.0000 mg | ORAL_TABLET | Freq: Every day | ORAL | Status: DC
  Administered 2012-01-21 – 2012-01-23 (×3): 5 mg via ORAL
  Filled 2012-01-20 (×4): qty 1

## 2012-01-20 NOTE — Progress Notes (Signed)
Inpatient Diabetes Program Recommendations  AACE/ADA: New Consensus Statement on Inpatient Glycemic Control (2009)  Target Ranges:  Prepandial:   less than 140 mg/dL      Peak postprandial:   less than 180 mg/dL (1-2 hours)      Critically ill patients:  140 - 180 mg/dL   Reason for Visit:  Inpatient Diabetes Program Recommendations Correction (SSI): Change Novolog correction scale to SENSITIVE AC & HS.  Note:

## 2012-01-20 NOTE — Progress Notes (Signed)
TCTS BRIEF PROGRESS NOTE   CT scan reviewed and notable for very small pericardial effusion and small anterior mediastinal fluid collection primarily to right side.  This is not unexpected this early after surgery.  There is no large collection of fluid nor any signs of infection.  [name] does report some weakness and dizziness with standing and ambulation which is likely related to his anemia.  He now requests transfusion PRBC's for symptomatic anemia.  The drainage from his incisions appears to be decreasing.  Will transfuse 2 units PRBC's and start p.o. Avelox.  Possible d/c home 1-2 days if sternal drainage resolves and WBC decreased.  OWEN,CLARENCE H 01/20/2012 4:13 PM

## 2012-01-20 NOTE — Progress Notes (Signed)
Midsternal dressings changed x 2, minimal serosang drainage present, no odor, incision looks good.  4x4s and betadine applied.  Will continue to monitor closely. Ave Filter

## 2012-01-20 NOTE — Progress Notes (Signed)
Changed patient's dressing per MD order per hospital protocol. Patient tolerated well. Bernita Raisin, RN

## 2012-01-20 NOTE — Progress Notes (Addendum)
[address]            [place] [address]          [phone]     8 Days Post-Op  Procedure(s) (LRB): REDO CORONARY ARTERY BYPASS GRAFTING (CABG) (N/A) AORTIC VALVE REPLACEMENT (AVR) (N/A) MAZE (N/A) Subjective: Moderate drainage persists, including cephalad portion of sternal incision  Objective  Telemetry NSR  Temp:  [98 F (36.7 C)-98.7 F (37.1 C)] 98 F (36.7 C) (02/15 0706) Pulse Rate:  [66-71] 71  (02/15 0706) Resp:  [17-18] 18  (02/15 0706) BP: (113-128)/(64-71) 128/66 mmHg (02/15 0706) SpO2:  [90 %-96 %] 90 % (02/15 0706) Weight:  [209 lb 12.8 oz (95.165 kg)] 209 lb 12.8 oz (95.165 kg) (02/15 0706)   Intake/Output Summary (Last 24 hours) at 01/20/12 0759 Last data filed at 01/19/12 2041  Gross per 24 hour  Intake   1080 ml  Output   1525 ml  Net   -445 ml       General appearance: alert, cooperative, no distress and pale Heart: regularly irregular rhythm and S1, S2 normal Lungs: mildly diminished in bases Abdomen: soft, non-tender; bowel sounds normal; no masses,  no organomegaly Extremities: BLE edema improved , but present Wound: serosang drainage from chest incision, no erethema, no sternal click  Lab Results:  Basename 01/20/12 0515  NA 136  K 4.6  CL 99  CO2 29  GLUCOSE 100*  BUN 23  CREATININE 1.08  CALCIUM 9.3  MG --  PHOS --   No results found for this basename: AST:2,ALT:2,ALKPHOS:2,BILITOT:2,PROT:2,ALBUMIN:2 in the last 72 hours No results found for this basename: LIPASE:2,AMYLASE:2 in the last 72 hours  Basename 01/20/12 0515  WBC 18.5*  NEUTROABS --  HGB 7.0*  HCT 22.0*  MCV 94.0  PLT 366   No results found for this basename: CKTOTAL:4,CKMB:4,TROPONINI:4 in the last 72 hours No components found with this basename: POCBNP:3 No results found for this basename: DDIMER in the last 72 hours No results found for this basename: HGBA1C in the last 72 hours No results found for this basename:  CHOL,HDL,LDLCALC,TRIG,CHOLHDL in the last 72 hours No results found for this basename: TSH,T4TOTAL,FREET3,T3FREE,THYROIDAB in the last 72 hours No results found for this basename: VITAMINB12,FOLATE,FERRITIN,TIBC,IRON,RETICCTPCT in the last 72 hours  Medications: Scheduled    . amiodarone  200 mg Oral BID PC  . aspirin EC  325 mg Oral Daily  . bisacodyl  10 mg Oral Daily   Or  . bisacodyl  10 mg Rectal Daily  . dextrose      . docusate sodium  200 mg Oral Daily  . folic acid  1 mg Oral Daily  . furosemide  40 mg Oral BID  . glyBURIDE  5 mg Oral BID WC  . insulin aspart  0-15 Units Subcutaneous TID WC  . insulin aspart  0-5 Units Subcutaneous QHS  . levothyroxine  50 mcg Oral QAC breakfast  . lisinopril  10 mg Oral Daily  . metFORMIN  1,000 mg Oral BID WC  . metoprolol tartrate  25 mg Oral BID  . pantoprazole  40 mg Oral Q1200  . polysaccharide iron  150 mg Oral Daily  . potassium chloride  20 mEq Oral BID  . vancomycin  1,250 mg Intravenous Q12H  . warfarin  4 mg Oral q1800     Radiology/Studies:  No results found.  INR:2.63 Will  add last result for INR, ABG once components are confirmed Will add last 4 CBG results once components are confirmed  Assessment/Plan: S/P Procedure(s) (LRB): REDO CORONARY ARTERY BYPASS GRAFTING (CABG) (N/A) AORTIC VALVE REPLACEMENT (AVR) (N/A) MAZE (N/A)  1. Leukocytosis persists with wbc sl up from the 12th 2. Hold on transfusion, but may need 3. Cont iv vanco 4. Decrease coumadin dose 5. Check noncontrast chest ct to r/o mediastinal fluid collection  LOS: 8 days    GOLD,WAYNE E 2/15/20137:59 AM   I have seen and examined the patient and agree with the assessment and plan as outlined.  Krisna Omar H 01/20/2012 8:17 AM

## 2012-01-20 NOTE — Progress Notes (Signed)
Nutrition Follow-up  Diet Order:  Carb mod PO intake mostly 75-100% Patient states he has improved appetite but does not care for the meals served. Previously was getting [employer], liked it, would like to continue to have it with meals.  Patient with midsternal incision with drainage. Patient with episode of SOB and "feeling bad" while walking in halls today.   Meds: Scheduled Meds:   . amiodarone  200 mg Oral BID PC  . aspirin EC  325 mg Oral Daily  . bisacodyl  10 mg Oral Daily   Or  . bisacodyl  10 mg Rectal Daily  . dextrose      . docusate sodium  200 mg Oral Daily  . folic acid  1 mg Oral Daily  . furosemide  40 mg Oral BID  . glyBURIDE  5 mg Oral BID WC  . insulin aspart  0-15 Units Subcutaneous TID WC  . insulin aspart  0-5 Units Subcutaneous QHS  . levothyroxine  50 mcg Oral QAC breakfast  . lisinopril  10 mg Oral Daily  . metFORMIN  1,000 mg Oral BID WC  . metoprolol tartrate  25 mg Oral BID  . pantoprazole  40 mg Oral Q1200  . polysaccharide iron  150 mg Oral Daily  . potassium chloride  20 mEq Oral BID  . vancomycin  1,250 mg Intravenous Q12H  . warfarin  4 mg Oral q1800   Continuous Infusions:  PRN Meds:.ondansetron (ZOFRAN) IV, oxyCODONE  Labs:  CMP     Component Value Date/Time   NA 136 01/20/2012 0515   K 4.6 01/20/2012 0515   CL 99 01/20/2012 0515   CO2 29 01/20/2012 0515   GLUCOSE 100* 01/20/2012 0515   BUN 23 01/20/2012 0515   CREATININE 1.08 01/20/2012 0515   CALCIUM 9.3 01/20/2012 0515   PROT 7.8 01/09/2012 1324   ALBUMIN 4.1 01/09/2012 1324   AST 30 01/09/2012 1324   ALT 41 01/09/2012 1324   ALKPHOS 77 01/09/2012 1324   BILITOT 0.6 01/09/2012 1324   GFRNONAA 72* 01/20/2012 0515   GFRAA 84* 01/20/2012 0515   CBG (last 3)   Basename 01/20/12 1140 01/20/12 0636 01/19/12 2206  GLUCAP 148* 105* 87   Lab Results  Component Value Date   HGBA1C 6.9* 01/09/2012     Intake/Output Summary (Last 24 hours) at 01/20/12 1533 Last data filed at 01/20/12 1300  Gross per 24 hour  Intake    960 ml  Output   1700 ml  Net   -740 ml    Weight Status:  209 lbs, trending down from 223 on 2/9 likely related to fluid losses, slightly below usually body weight per office visits  Re-estimated needs:  Unchanged 2200-2300 kcal and 120-230 gm protein  Nutrition Dx:  Increased nutrient needs, ongoing  Goal:  Meet >90 % of estimated nutrition needs, likely unmet due to calorie range of current diet is 1600-2000 and not eating 100% of all meals  Intervention:   1. Per patient request, will add Resource Breeze once daily (250 kcal with 54 gm form carbohydrate, 9 gm protein)  Monitor:  PO intake, weight, labs, CBG's, I/O's   Rudean Haskell Pager #:  [phone]

## 2012-01-20 NOTE — Progress Notes (Signed)
Midsternal dressings changed x 2, minimal serosang drainage present, incisions look good, no odor noted.  Betadine and 4x4 applied. Pt ambulated independently with walker on RA, SOB on return, pale, feels like he did not tolerate this walk near as well as past times.  "I feel bad, I think I want the blood transfusion now, I see floaters sometimes."  Pt instructed to remain in recliner with feet elevated, VSS, told to rest for awhile and call for next ambulation for stand by assistance just in case. Will Continue to monitor and encourage. Ave Filter

## 2012-01-20 NOTE — Progress Notes (Signed)
ANTIBIOTIC CONSULT NOTE - INITIAL  Pharmacy Consult for vancomycin Indication: possible sternal incision infection  No Known Allergies  Patient Measurements: Height: 5\' 8"  (172.7 cm) Weight: 209 lb 12.8 oz (95.165 kg) IBW/kg (Calculated) : 68.4   Vital Signs: Temp: 98.3 F (36.8 C) (02/15 2109) Temp src: Oral (02/15 2109) BP: 146/67 mmHg (02/15 2109) Pulse Rate: 68  (02/15 2109) Intake/Output from previous day: 02/14 0701 - 02/15 0700 In: 1080 [P.O.:1080] Out: 1525 [Urine:1525]  Labs:  [facility] 01/20/12 0515  WBC 18.5*  HGB 7.0*  PLT 366  LABCREA --  CREATININE 1.08   Estimated Creatinine Clearance: 80.4 ml/min (by C-G formula based on Cr of 1.08).  Basename 01/20/12 2000  VANCOTROUGH 15.8  VANCOPEAK --  VANCORANDOM --  GENTTROUGH --  GENTPEAK --  GENTRANDOM --  TOBRATROUGH --  TOBRAPEAK --  TOBRARND --  AMIKACINPEAK --  AMIKACINTROU --  AMIKACIN --     Microbiology: Recent Results (from the past 720 hour(s))  SURGICAL PCR SCREEN     Status: Abnormal   Collection Time   01/09/12  1:05 PM      Component Value Range Status Comment   MRSA, PCR NEGATIVE  NEGATIVE  Final    Staphylococcus aureus POSITIVE (*) NEGATIVE  Final   URINE CULTURE     Status: Normal   Collection Time   01/15/12  9:46 AM      Component Value Range Status Comment   Specimen Description URINE, CLEAN CATCH   Final    Special Requests NONE   Final    Culture  Setup Time [id number]   Final    Colony Count NO GROWTH   Final    Culture NO GROWTH   Final    Report Status 01/16/2012 FINAL   Final   WOUND CULTURE     Status: Normal   Collection Time   01/17/12 11:47 AM      Component Value Range Status Comment   Specimen Description WOUND STERNUM   Final    Special Requests NONE   Final    Gram Stain     Final    Value: NO WBC SEEN     NO SQUAMOUS EPITHELIAL CELLS SEEN     NO ORGANISMS SEEN   Culture NO GROWTH 2 DAYS   Final    Report Status 01/20/2012 FINAL   Final     Medical  History: Past Medical History  Diagnosis Date  . Mixed hyperlipidemia   . Coronary atherosclerosis of native coronary artery     2 vessel s/p CABG  . Type 2 diabetes mellitus   . Aortic stenosis     Moderate  . Myocardial infarction 1998  . Essential hypertension, benign     on medicaton  . Atrial fibrillation 10/2011    unsuccessful CV 11/2011  . Heart murmur     aortic stenosis  . Shortness of breath     exertional  . Sleep apnea 2010    wears CPAP nightly  . CHF (congestive heart failure) 11/2011    on Lasix to control edema  . Pneumonia 2010  . Diabetes mellitus     Type 2 NIDDM  X 10 years  . Hypothyroidism     on medication  . H/O hiatal hernia   . Arthritis     osteoarthritis  . Rotator cuff tear     Rt shoulder    Medications:  Prescriptions prior to admission  Medication Sig Dispense Refill  . amLODipine-atorvastatin (CADUET)  5-20 MG per tablet Take 1 tablet by mouth daily.       Marland Kitchen glyBURIDE-metformin (GLUCOVANCE) 2.5-500 MG per tablet Take 2 tablets by mouth 2 (two) times daily with a meal.       . levothyroxine (SYNTHROID, LEVOTHROID) 50 MCG tablet Take 50 mcg by mouth daily.       . nitroGLYCERIN (NITROSTAT) 0.4 MG SL tablet Place 0.4 mg under the tongue every 5 (five) minutes as needed. For chest pain      . VOLTAREN 1 % GEL Apply 1 application topically 4 (four) times daily as needed. For pain      . DISCONTD: amiodarone (PACERONE) 200 MG tablet Take 200 mg by mouth daily.      Marland Kitchen DISCONTD: aspirin EC 81 MG tablet Take 81 mg by mouth daily.      Marland Kitchen DISCONTD: etodolac (LODINE) 500 MG tablet Take 500 mg by mouth 2 (two) times daily.       Marland Kitchen DISCONTD: furosemide (LASIX) 20 MG tablet Take 20 mg by mouth daily.      Marland Kitchen DISCONTD: isosorbide mononitrate (IMDUR) 30 MG 24 hr tablet Take 30 mg by mouth daily.       Marland Kitchen DISCONTD: metoprolol succinate (TOPROL-XL) 25 MG 24 hr tablet Take 25 mg by mouth daily.      Marland Kitchen DISCONTD: warfarin (COUMADIN) 5 MG tablet Take 2.5-10 mg  by mouth every evening. 2 tablets (10mg ) Monday and Friday; 0.5 tablet (2.5mg ) the rest of the week       Assessment: 60 yom POD#7 s/p CABG, AVR, MAZE. Patient's drainage is somewhat diminished but still with leukocytosis despite IV antibiotics.  His Vancomycin level tonight is 15.50mcg/ml and the dose may have been hung just prior to drawing the trough level.  This level is just at the top end of goal range without noted changes in renal function.   Goal of Therapy:  Vancomycin trough level 10-15 mcg/ml  Plan:  1).  Will continue IV Vancomycin at 1250 mg every 12 hours 2).  F/u renal function and adjust as needed. 3).  F/U culture data and de-escalate antibiotics if appropriate.   Nadara Mustard, PharmD., MS Clinical Pharmacist Pager [phone] 01/20/2012,9:34 PM

## 2012-01-21 LAB — BASIC METABOLIC PANEL
CO2: 28 mEq/L (ref 19–32)
Calcium: 9.2 mg/dL (ref 8.4–10.5)
Calcium: 9.2 mg/dL (ref 8.4–10.5)
Creatinine, Ser: 0.95 mg/dL (ref 0.50–1.35)
GFR calc Af Amer: >90 mL/min (ref >90)
GFR calc non Af Amer: 76 mL/min — ABNORMAL LOW (ref >90)
GFR calc non Af Amer: 88 mL/min — ABNORMAL LOW (ref >90)
Sodium: 135 mEq/L (ref 135–145)
Sodium: 134 mEq/L — ABNORMAL LOW (ref 135–145)

## 2012-01-21 LAB — PROTIME-INR
INR: 3.03 — ABNORMAL HIGH (ref 0.00–1.49)
Prothrombin Time: 31.9 seconds — ABNORMAL HIGH (ref 11.6–15.2)

## 2012-01-21 LAB — GLUCOSE, CAPILLARY
Glucose-Capillary: 88 mg/dL (ref 70–99)
Glucose-Capillary: 144 mg/dL — ABNORMAL HIGH (ref 70–99)
Glucose-Capillary: 62 mg/dL — ABNORMAL LOW (ref 70–99)
Glucose-Capillary: 64 mg/dL — ABNORMAL LOW (ref 70–99)

## 2012-01-21 LAB — CBC
Platelets: 372 10*3/uL (ref 150–400)
RDW: 15.5 % (ref 11.5–15.5)
WBC: 18.3 10*3/uL — ABNORMAL HIGH (ref 4.0–10.5)

## 2012-01-21 MED ORDER — WARFARIN SODIUM 2.5 MG PO TABS
2.5000 mg | ORAL_TABLET | Freq: Every day | ORAL | Status: DC
  Administered 2012-01-21: 2.5 mg via ORAL
  Filled 2012-01-21 (×3): qty 1

## 2012-01-21 MED ORDER — LORAZEPAM 1 MG PO TABS
2.0000 mg | ORAL_TABLET | Freq: Three times a day (TID) | ORAL | Status: DC | PRN
  Administered 2012-01-21: 2 mg via ORAL
  Filled 2012-01-21: qty 2

## 2012-01-21 NOTE — Progress Notes (Signed)
1500 attempted to walk pt, he stated he is "seeing double". States that he had some pain medicine an hour ago and has been hearing voiced and not feeling right since. Pt stated he did not feel "safe" to walk at this time. RN made aware.

## 2012-01-21 NOTE — Progress Notes (Addendum)
[address]            [place] [address]          [phone]     9 Days Post-Op Procedure(s) (LRB): REDO CORONARY ARTERY BYPASS GRAFTING (CABG) (N/A) AORTIC VALVE REPLACEMENT (AVR) (N/A) MAZE (N/A)  Subjective: Feels "weird" this am. Just finished blood infusion an hour or so ago.  Has not really been up and around yet.  Objective: Vital signs in last 24 hours: Patient Vitals for the past 24 hrs:  BP Temp Temp src Pulse Resp SpO2 Weight  01/21/12 0600 123/72 mmHg 98.8 F (37.1 C) Oral 62  20  95 % -  01/21/12 0545 121/70 mmHg 98.6 F (37 C) Oral 59  20  - -  01/21/12 0455 121/67 mmHg 98.8 F (37.1 C) Oral 58  18  - -  01/21/12 0355 118/70 mmHg 98.7 F (37.1 C) Oral 58  20  - 96.888 kg (213 lb 9.6 oz)  01/21/12 0255 124/72 mmHg 98.6 F (37 C) Oral 67  20  - -  01/21/12 0230 120/73 mmHg 98.3 F (36.8 C) Oral 65  20  - -  01/21/12 0150 120/76 mmHg 98.6 F (37 C) Oral 65  20  - -  01/21/12 0130 128/78 mmHg 99 F (37.2 C) Oral 65  20  - -  01/21/12 0030 130/72 mmHg 98.2 F (36.8 C) Oral 70  18  - -  01/20/12 2330 132/74 mmHg 98.8 F (37.1 C) Oral 68  20  - -  01/20/12 2230 122/70 mmHg 99.5 F (37.5 C) Oral 66  20  - -  01/20/12 2215 123/73 mmHg 98.7 F (37.1 C) Oral 67  18  - -  01/20/12 2109 146/67 mmHg 98.3 F (36.8 C) Oral 68  18  98 % -  01/20/12 1348 107/61 mmHg 97.9 F (36.6 C) Oral 61  18  97 % -   Current Weight  01/21/12 96.888 kg (213 lb 9.6 oz)     Intake/Output from previous day: 02/15 0701 - 02/16 0700 In: 3890 [P.O.:1440; Blood:700; IV Piggyback:1750] Out: 2175 [Urine:1675; Chest Tube:500]    PHYSICAL EXAM:  Heart: RRR Lungs: clear Wound: still with a moderate amount serosanguinous drainage from upper and mid sternal incision.  Sternum stable.  No erythema Extremities: Mild bilateral LE edema  Lab Results: CBC: Basename 01/20/12 0515  WBC 18.5*  HGB 7.0*  HCT 22.0*  PLT 366   BMET:  Basename  01/21/12 0200 01/20/12 0515  NA 135 136  K 4.5 4.6  CL 100 99  CO2 28 29  GLUCOSE 94 100*  BUN 21 23  CREATININE 1.04 1.08  CALCIUM 9.2 9.3    PT/INR:  Basename 01/20/12 0515  LABPROT 28.5*  INR 2.63*     Assessment/Plan: S/P Procedure(s) (LRB): REDO CORONARY ARTERY BYPASS GRAFTING (CABG) (N/A) AORTIC VALVE REPLACEMENT (AVR) (N/A) MAZE (N/A) Sternal drainage improving.  Continue IV abx, follow up WBC.  No fever. ABL anemia- s/p tx 2 units PRBCs.  Monitor labs. Vol overload- diurese PT/INR not drawn this am as pt was getting blood. Will have lab draw now. Continue current care. Possibly home in next few days if improved.   LOS: 9 days    COLLINS,GINA H 01/21/2012   I have seen and examined the patient and agree with the assessment and plan as outlined.  WBC  still 18k but no fevers.  INR increased, will decrease coumadin.  Still draining from sternal incision - continue IV antibiotics and keep in hospital until improved/resolved.  Purcell Nails 01/21/2012 12:35 PM

## 2012-01-22 LAB — CBC
HCT: 30 % — ABNORMAL LOW (ref 39.0–52.0)
MCHC: 33 g/dL (ref 30.0–36.0)
RDW: 15.6 % — ABNORMAL HIGH (ref 11.5–15.5)

## 2012-01-22 LAB — PROTIME-INR: Prothrombin Time: 35.1 seconds — ABNORMAL HIGH (ref 11.6–15.2)

## 2012-01-22 LAB — GLUCOSE, CAPILLARY: Glucose-Capillary: 116 mg/dL — ABNORMAL HIGH (ref 70–99)

## 2012-01-22 LAB — BASIC METABOLIC PANEL
BUN: 21 mg/dL (ref 6–23)
Calcium: 9.5 mg/dL (ref 8.4–10.5)
Chloride: 95 mEq/L — ABNORMAL LOW (ref 96–112)
Creatinine, Ser: 1.16 mg/dL (ref 0.50–1.35)
GFR calc Af Amer: 77 mL/min — ABNORMAL LOW (ref >90)

## 2012-01-22 LAB — TYPE AND SCREEN

## 2012-01-22 MED ORDER — MOXIFLOXACIN HCL 400 MG PO TABS: 400.0000 mg | ORAL_TABLET | Freq: Every day | ORAL | Status: AC

## 2012-01-22 MED ORDER — AMIODARONE HCL 200 MG PO TABS
200.0000 mg | ORAL_TABLET | Freq: Every day | ORAL | Status: DC
  Administered 2012-01-23: 200 mg via ORAL
  Filled 2012-01-22 (×2): qty 1

## 2012-01-22 MED ORDER — AMIODARONE HCL 200 MG PO TABS: 200.0000 mg | ORAL_TABLET | Freq: Every day | ORAL | Status: DC

## 2012-01-22 MED ORDER — ASPIRIN EC 81 MG PO TBEC
81.0000 mg | DELAYED_RELEASE_TABLET | Freq: Every day | ORAL | Status: DC
  Administered 2012-01-22 – 2012-01-23 (×2): 81 mg via ORAL
  Filled 2012-01-22 (×2): qty 1

## 2012-01-22 MED ORDER — POTASSIUM CHLORIDE CRYS ER 20 MEQ PO TBCR
20.0000 meq | EXTENDED_RELEASE_TABLET | Freq: Every day | ORAL | Status: DC
  Administered 2012-01-22 – 2012-01-23 (×2): 20 meq via ORAL
  Filled 2012-01-22: qty 1

## 2012-01-22 MED ORDER — ASPIRIN 81 MG PO TBEC: 81.0000 mg | DELAYED_RELEASE_TABLET | Freq: Every day | ORAL | Status: AC

## 2012-01-22 MED ORDER — GUAIFENESIN ER 600 MG PO TB12
600.0000 mg | ORAL_TABLET | Freq: Two times a day (BID) | ORAL | Status: DC
  Administered 2012-01-22 – 2012-01-23 (×3): 600 mg via ORAL
  Filled 2012-01-22 (×5): qty 1

## 2012-01-22 NOTE — Progress Notes (Addendum)
                    [address]            [place] [address]          [phone]     10 Days Post-Op Procedure(s) (LRB): REDO CORONARY ARTERY BYPASS GRAFTING (CABG) (N/A) AORTIC VALVE REPLACEMENT (AVR) (N/A) MAZE (N/A)  Subjective: Feels ok.  + cough, nonproductive.  Walked in halls twice already this am.   Objective: Vital signs in last 24 hours: Patient Vitals for the past 24 hrs:  BP Temp Temp src Pulse Resp SpO2 Weight  01/22/12 0604 115/69 mmHg 98.1 F (36.7 C) Oral 64  18  95 % 96.2 kg (212 lb 1.3 oz)  01/22/12 0136 - - - - - - 96.2 kg (212 lb 1.3 oz)  01/21/12 2144 129/80 mmHg 98.2 F (36.8 C) Oral 62  18  99 % -  01/21/12 1436 119/70 mmHg 98.4 F (36.9 C) Oral 58  14  96 % -  01/21/12 1029 123/71 mmHg - - 66  - - -   Current Weight  01/22/12 96.2 kg (212 lb 1.3 oz)     Intake/Output from previous day: 02/16 0701 - 02/17 0700 In: 480 [P.O.:480] Out: 400 [Urine:400] CBGs 62-92-64-78-107-152-149   PHYSICAL EXAM:  Heart: RRR, good valve sounds Lungs: clear Wound: serosanguinous drainage, mostly from lower portion of sternum.  No surrounding erythema Extremities: +LE edema  Lab Results: CBC: Basename 01/22/12 0544 01/21/12 0928  WBC 23.8* 18.3*  HGB 9.9* 9.6*  HCT 30.0* 29.6*  PLT 398 372   BMET:  Basename 01/22/12 0544 01/21/12 0928  NA 131* 134*  K 5.0 4.4  CL 95* 97  CO2 28 27  GLUCOSE 152* 206*  BUN 21 20  CREATININE 1.16 0.95  CALCIUM 9.5 9.2    PT/INR:  Basename 01/22/12 0544  LABPROT 35.1*  INR 3.43*   Wound cx, urine cx negative  Assessment/Plan: S/P Procedure(s) (LRB): REDO CORONARY ARTERY BYPASS GRAFTING (CABG) (N/A) AORTIC VALVE REPLACEMENT (AVR) (N/A) MAZE (N/A) Sternal drainage improving, but WBC up further today.  Continue IV Vanc, po Avelox.  No fever. No other sources for leukocytosis.   ABL anemia- improved after transfusion. Vol overload- diurese. Hold Coumadin today with increased INR. K+  elevated today.  Will decrease dose. DM- sugars stable. CV- SR, BPs ok.    LOS: 10 days    COLLINS,GINA H 01/22/2012   I have seen and examined the patient and agree with the assessment and plan as outlined.  Eleazar Kimmey H 01/22/2012 1:29 PM

## 2012-01-22 NOTE — Progress Notes (Signed)
CBG: 64  Treatment: Pt ate graham crackers and peanut butter and drank orange juice.  Symptoms: Pt felt nervous.  Follow-up CBG: Time: 2221 CBG Result: 78 Follow-up CBG: Time: 2353   CBG Result: 107  Possible Reasons for Event: medications  Comments/MD notified: Will continue to monitor   Alfonso Ellis, RN

## 2012-01-22 NOTE — Progress Notes (Signed)
Pt ambulated in the hallway several times today and tolerated it well.   Alfonso Ellis, RN

## 2012-01-22 NOTE — Progress Notes (Signed)
   CARDIOTHORACIC SURGERY PROGRESS NOTE  10 Days Post-Op  S/P Procedure(s) (LRB): REDO CORONARY ARTERY BYPASS GRAFTING (CABG) (N/A) AORTIC VALVE REPLACEMENT (AVR) (N/A) MAZE (N/A)  Subjective: Feels better and looks better, wants to go home  Objective: Vital signs in last 24 hours: Temp:  [98.1 F (36.7 C)-98.4 F (36.9 C)] 98.1 F (36.7 C) (02/17 0604) Pulse Rate:  [58-66] 64  (02/17 0604) Cardiac Rhythm:  [-] Normal sinus rhythm (02/17 0821) Resp:  [14-18] 18  (02/17 0604) BP: (115-129)/(69-80) 115/69 mmHg (02/17 0604) SpO2:  [95 %-99 %] 95 % (02/17 0604) Weight:  [96.2 kg (212 lb 1.3 oz)] 96.2 kg (212 lb 1.3 oz) (02/17 0604)  Physical Exam:  Rhythm:   sinus  Breath sounds: clear  Heart sounds:  RRR  Incisions:  Trivial drainage overnight, looks better  Abdomen:  soft  Extremities:  warm   Intake/Output from previous day: 02/16 0701 - 02/17 0700 In: 480 [P.O.:480] Out: 400 [Urine:400] Intake/Output this shift: Total I/O In: 240 [P.O.:240] Out: -   Lab Results:  Basename 01/22/12 0544 01/21/12 0928  WBC 23.8* 18.3*  HGB 9.9* 9.6*  HCT 30.0* 29.6*  PLT 398 372   BMET:  Basename 01/22/12 0544 01/21/12 0928  NA 131* 134*  K 5.0 4.4  CL 95* 97  CO2 28 27  GLUCOSE 152* 206*  BUN 21 20  CREATININE 1.16 0.95  CALCIUM 9.5 9.2    CBG (last 3)   Basename 01/22/12 0616 01/21/12 2353 01/21/12 2221  GLUCAP 149* 107* 78   PT/INR:   Basename 01/22/12 0544  LABPROT 35.1*  INR 3.43*    CXR:  N/A  Assessment/Plan: S/P Procedure(s) (LRB): REDO CORONARY ARTERY BYPASS GRAFTING (CABG) (N/A) AORTIC VALVE REPLACEMENT (AVR) (N/A) MAZE (N/A)  [name] looks and feels better and wound drainage appears to have stopped.  However, WBC increased today.  INR increased as well.  Will observe one more day and recheck labs in am.  Will d/c Vanc and plan to send him home tomorrow on Avelox x 2 weeks if WBC decreased in am and wound stable.  If he goes home tomorrow I  will want to see him in the office in 1 week.  [name] 01/22/2012 9:05 AM

## 2012-01-22 NOTE — Progress Notes (Signed)
Pt ambulated in hallway 550 feet with rolling walker. Pt tolerated well.  Alfonso Ellis, RN

## 2012-01-23 ENCOUNTER — Telehealth: Payer: Self-pay | Admitting: *Deleted

## 2012-01-23 LAB — BASIC METABOLIC PANEL
Calcium: 9.2 mg/dL (ref 8.4–10.5)
Chloride: 98 mEq/L (ref 96–112)
Creatinine, Ser: 1.05 mg/dL (ref 0.50–1.35)
GFR calc Af Amer: 87 mL/min — ABNORMAL LOW (ref >90)

## 2012-01-23 LAB — PROTIME-INR
INR: 2.77 — ABNORMAL HIGH (ref 0.00–1.49)
Prothrombin Time: 29.7 seconds — ABNORMAL HIGH (ref 11.6–15.2)

## 2012-01-23 LAB — CBC
Platelets: 374 10*3/uL (ref 150–400)
RDW: 15.5 % (ref 11.5–15.5)
WBC: 21.3 10*3/uL — ABNORMAL HIGH (ref 4.0–10.5)

## 2012-01-23 LAB — GLUCOSE, CAPILLARY: Glucose-Capillary: 140 mg/dL — ABNORMAL HIGH (ref 70–99)

## 2012-01-23 NOTE — Progress Notes (Signed)
TCTS BRIEF PROGRESS NOTE   Feels well Sternal drainage appears to have stopped No fevers WBC still elevated but down slightly Will d/c home today  Will recheck CBC and wound in office next Monday Continue coumadin 2.5 daily  Latitia Housewright H 01/23/2012 8:27 AM

## 2012-01-23 NOTE — Progress Notes (Signed)
Pt being discharged home with family.   DC instructions and prescriptions given and reviewed.  F/U appts in place.

## 2012-01-23 NOTE — Progress Notes (Signed)
   CARE MANAGEMENT NOTE 01/23/2012  Patient:  [name], [name]   Account Number:  [id number]  Date Initiated:  01/13/2012  Documentation initiated by:  [facility]  Subjective/Objective Assessment:   Post op CABGx 3, AVR and Maze on 01-12-12.  Has Spouse     Action/Plan:   PTA, PT INDEPENDENT, LIVES WITH SPOUSE.  WIFE AND MOTHER IN LAW TO PROVIDE 24HR CARE AT DISCHARGE.   Anticipated DC Date:  01/18/2012   Anticipated DC Plan:  HOME W HOME HEALTH SERVICES      DC Planning Services  CM consult      Choice offered to / List presented to:             Status of service:  Completed, signed off Medicare Important Message given?   (If response is "NO", the following Medicare IM given date fields will be blank) Date Medicare IM given:   Date Additional Medicare IM given:    Discharge Disposition:  HOME/SELF CARE  Per UR Regulation:  Reviewed for med. necessity/level of care/duration of stay  Comments:  01/23/12 Jago Carton,RN,BSN PT DISCHARGED TO HOME TODAY WITH SPOUSE.  NO HH NEEDED. Phone #[phone]   01/16/12 Shemaiah Round,RN,BSN 1130 MET WITH PT TO DISCUSS DC NEEDS.  HE THINKS HE NEEDS A RW FOR HOME.  WILL REQUEST FROM [facility].  WILL CONT TO FOLLOW FOR HOME NEEDS AS PT PROGRESSES. Phone #[phone]

## 2012-01-23 NOTE — Telephone Encounter (Signed)
Patient was told to have coumadin cked by [name], but was checked in hosp this am.  Reading was 2.7 in am.  Please call patient.

## 2012-01-23 NOTE — Telephone Encounter (Signed)
Spoke with pt.  Appt made for INR check on 01/27/12.

## 2012-01-27 ENCOUNTER — Ambulatory Visit (INDEPENDENT_AMBULATORY_CARE_PROVIDER_SITE_OTHER): Payer: BC Managed Care – PPO | Admitting: *Deleted

## 2012-01-27 DIAGNOSIS — Z7901 Long term (current) use of anticoagulants: Secondary | ICD-10-CM

## 2012-01-27 DIAGNOSIS — I4891 Unspecified atrial fibrillation: Secondary | ICD-10-CM

## 2012-01-27 LAB — POCT INR: INR: 3.2

## 2012-01-31 ENCOUNTER — Ambulatory Visit (INDEPENDENT_AMBULATORY_CARE_PROVIDER_SITE_OTHER): Payer: BC Managed Care – PPO | Admitting: *Deleted

## 2012-01-31 DIAGNOSIS — I4891 Unspecified atrial fibrillation: Secondary | ICD-10-CM

## 2012-01-31 DIAGNOSIS — Z7901 Long term (current) use of anticoagulants: Secondary | ICD-10-CM

## 2012-01-31 LAB — POCT INR: INR: 3.9

## 2012-02-01 ENCOUNTER — Other Ambulatory Visit: Payer: Self-pay | Admitting: Thoracic Surgery (Cardiothoracic Vascular Surgery)

## 2012-02-01 DIAGNOSIS — I2510 Atherosclerotic heart disease of native coronary artery without angina pectoris: Secondary | ICD-10-CM

## 2012-02-06 ENCOUNTER — Encounter: Payer: Self-pay | Admitting: Thoracic Surgery (Cardiothoracic Vascular Surgery)

## 2012-02-06 ENCOUNTER — Ambulatory Visit (INDEPENDENT_AMBULATORY_CARE_PROVIDER_SITE_OTHER): Payer: Self-pay | Admitting: Thoracic Surgery (Cardiothoracic Vascular Surgery)

## 2012-02-06 ENCOUNTER — Ambulatory Visit
Admission: RE | Admit: 2012-02-06 | Discharge: 2012-02-06 | Disposition: A | Discharge status: Home / Self Care | Payer: BC Managed Care – PPO | Source: Ambulatory Visit | Attending: Thoracic Surgery (Cardiothoracic Vascular Surgery) | Admitting: Thoracic Surgery (Cardiothoracic Vascular Surgery)

## 2012-02-06 DIAGNOSIS — Z9889 Other specified postprocedural states: Secondary | ICD-10-CM

## 2012-02-06 DIAGNOSIS — Z951 Presence of aortocoronary bypass graft: Secondary | ICD-10-CM

## 2012-02-06 DIAGNOSIS — Z954 Presence of other heart-valve replacement: Secondary | ICD-10-CM

## 2012-02-06 DIAGNOSIS — I2510 Atherosclerotic heart disease of native coronary artery without angina pectoris: Secondary | ICD-10-CM

## 2012-02-06 DIAGNOSIS — Z8679 Personal history of other diseases of the circulatory system: Secondary | ICD-10-CM

## 2012-02-06 DIAGNOSIS — Z952 Presence of prosthetic heart valve: Secondary | ICD-10-CM

## 2012-02-06 NOTE — Patient Instructions (Signed)
The patient has been reminded to continue to avoid any heavy lifting or strenuous use of arms or shoulders for at least a total of three months from the time of surgery. The patient has been instructed that they may return driving an automobile as long as they are no longer requiring oral narcotic pain relievers during the daytime.  They have been advised to start driving short distances during the daylight and gradually increase from there as they feel comfortable.  The patient has been instructed to stop taking Amiodarone when his current prescription runs out.

## 2012-02-06 NOTE — Progress Notes (Signed)
[address]            [place] [address]          [phone]     CARDIOTHORACIC SURGERY OFFICE NOTE  Referring Provider is Jonelle Sidle, MD PCP is Ignatius Specking., MD, MD   HPI:  Patient returns for routine followup status post redo coronary artery bypass grafting x3, aortic valve replacement and maze procedure on 01/12/2012. His postoperative recovery has been uncomplicated. Since hospital discharge the patient has done well.  He has had his prothrombin time checked and Coumadin dose adjusted through the [facility] on 2 occasions. He reports that he is doing quite nicely. He has minimal residual soreness in his chest. He has some soreness in his left side from endoscopic vein harvest. He has no shortness of breath. His appetite is good. He sleeping well at night. He has no dizzy spells. He has no tachypalpitations. Overall he feels quite well.   Current Outpatient Prescriptions  Medication Sig Dispense Refill  . amiodarone (PACERONE) 200 MG tablet Take 1 tablet (200 mg total) by mouth daily.  30 tablet  1  . amLODipine-atorvastatin (CADUET) 5-20 MG per tablet Take 1 tablet by mouth daily.       Marland Kitchen aspirin EC 81 MG EC tablet Take 1 tablet (81 mg total) by mouth daily.      . folic acid (FOLVITE) 1 MG tablet Take 1 tablet (1 mg total) by mouth daily. For one month then stop.  30 tablet  0  . furosemide (LASIX) 40 MG tablet Take 1 tablet (40 mg total) by mouth daily.  30 tablet  0  . glyBURIDE-metformin (GLUCOVANCE) 2.5-500 MG per tablet Take 2 tablets by mouth 2 (two) times daily with a meal.       . levothyroxine (SYNTHROID, LEVOTHROID) 50 MCG tablet Take 50 mcg by mouth daily.       Marland Kitchen lisinopril (PRINIVIL,ZESTRIL) 10 MG tablet Take 1 tablet (10 mg total) by mouth daily.  30 tablet  1  . metoprolol tartrate (LOPRESSOR) 25 MG tablet Take 1 tablet (25 mg total) by mouth 2 (two) times daily.  60 tablet  1  . nitroGLYCERIN (NITROSTAT) 0.4  MG SL tablet Place 0.4 mg under the tongue every 5 (five) minutes as needed. For chest pain      . oxycodone (OXY-IR) 5 MG capsule Take 5 mg by mouth every 4 (four) hours as needed.      . polysaccharide iron (NIFEREX) 150 MG CAPS capsule Take 1 capsule (150 mg total) by mouth daily.  30 each  0  . potassium chloride SA (K-DUR,KLOR-CON) 20 MEQ tablet Take 1 tablet (20 mEq total) by mouth daily.  30 tablet  0  . VOLTAREN 1 % GEL Apply 1 application topically 4 (four) times daily as needed. For pain      . warfarin (COUMADIN) 5 MG tablet Take 0.5 tab (2.5 mg) daily or as directed by Coumadin Clinic  60 tablet  1      Physical Exam:   BP 124/75  Pulse 62  Resp 16  Ht 5\' 9"  (1.753 m)  Wt 196 lb 9.6 oz (89.177 kg)  BMI 29.03 kg/m2  SpO2 95%  General:  Well-appearing  Chest:   Clear to auscultation with symmetrical breath sounds  CV:   Regular rate and rhythm  Incisions:  Clean and  dry, healing nicely, sternum is stable  Abdomen:  Soft and nontender  Extremities:  Warm and well-perfused  Diagnostic Tests:  2 channel telemetry rhythm strip demonstrates sinus rhythm.  *RADIOLOGY REPORT*  Clinical Data: Post CABG, follow-up  CHEST - 2 VIEW  Comparison: Chest x-ray of 01/15/2012 and CT chest of 01/20/2012  Findings: Aeration has improved. The small effusion has resolved.  Cardiomegaly is stable. Median sternotomy sutures are noted and  aortic valve replacement is unchanged. There are degenerative  changes in the lower thoracic spine.  IMPRESSION:  Improved aeration with resolution of small effusion. Stable  cardiomegaly.  Original Report Authenticated By: Juline Patch, M.D.   Impression:  Patient is doing quite well approximately one month status post redo coronary artery bypass grafting, aortic valve replacement, and Maze procedure. He is maintaining sinus rhythm and clinically improving every day.  Plan:  I've encouraged patient to continue to gradually increase his physical  activity as tolerated and to go and it started the cardiac rehabilitation program. Have reminded him to refrain from any heavy lifting or strenuous use of his arms or shoulders. I've instructed him to continue amiodarone for 1 month then stop amiodarone when his current prescription runs out. We have otherwise not made any changes to his medications. We'll plan to see him back in 2 months for routine followup and rhythm check.   Salvatore Decent. Cornelius Moras, MD 02/06/2012 11:47 AM

## 2012-02-10 ENCOUNTER — Ambulatory Visit (INDEPENDENT_AMBULATORY_CARE_PROVIDER_SITE_OTHER): Payer: BC Managed Care – PPO | Admitting: *Deleted

## 2012-02-10 DIAGNOSIS — Z7901 Long term (current) use of anticoagulants: Secondary | ICD-10-CM

## 2012-02-10 DIAGNOSIS — I4891 Unspecified atrial fibrillation: Secondary | ICD-10-CM

## 2012-02-13 ENCOUNTER — Encounter: Payer: Self-pay | Admitting: Cardiology

## 2012-02-15 ENCOUNTER — Ambulatory Visit (INDEPENDENT_AMBULATORY_CARE_PROVIDER_SITE_OTHER): Payer: BC Managed Care – PPO | Admitting: Cardiology

## 2012-02-15 ENCOUNTER — Encounter: Payer: Self-pay | Admitting: Cardiology

## 2012-02-15 VITALS — BP 118/77 | HR 80 | Ht 68.0 in | Wt 196.0 lb

## 2012-02-15 DIAGNOSIS — Z954 Presence of other heart-valve replacement: Secondary | ICD-10-CM

## 2012-02-15 DIAGNOSIS — Z952 Presence of prosthetic heart valve: Secondary | ICD-10-CM

## 2012-02-15 DIAGNOSIS — Z951 Presence of aortocoronary bypass graft: Secondary | ICD-10-CM

## 2012-02-15 DIAGNOSIS — I2510 Atherosclerotic heart disease of native coronary artery without angina pectoris: Secondary | ICD-10-CM

## 2012-02-15 DIAGNOSIS — I4891 Unspecified atrial fibrillation: Secondary | ICD-10-CM

## 2012-02-15 MED ORDER — FUROSEMIDE 40 MG PO TABS: ORAL_TABLET | ORAL | Status: DC

## 2012-02-15 MED ORDER — ATORVASTATIN CALCIUM 20 MG PO TABS: 20.0000 mg | ORAL_TABLET | Freq: Every day | ORAL | Status: DC

## 2012-02-15 NOTE — Patient Instructions (Signed)
   Stop Amlodipine after finishing current bottle  Change Lasix to 40mg  as needed only for weight gain greater than 3 pounds  Stop Caduet  Begin Lipitor 20mg  every evening   Follow up with Dr. Diona Browner just before May 2nd (patient will need to be released to go back to work)

## 2012-02-15 NOTE — Progress Notes (Signed)
Derrick Bottoms, MD, [facility] ABIM Board Certified in Adult Cardiovascular Medicine,Internal Medicine and Critical Care Medicine    CC: Followup patient after recent redo coronary bypass grafting, aVR and Cox-Maze procedure.  HPI:  The patient presents for followup after surgery. He has successful Cox-Maze procedure at the time of surgery and remains in normal sinus rhythm by EKG today. 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 He states his been doing well since his surgery in February 2013. He reports no recurrent palpitations, chest pain shortness of breath orthopnea PND. He has minimal residual soreness in his chest. He has also significant amount of weight over the last couple months of approximately 32 pounds. He continues to take warfarin and his recent INR was 2.8. The patient reports no bleeding complications. The plan is for him to join work first again May 2 this year. In the pulmonary doctor Diona Browner has been scheduled just prior to this date. The patient is requesting if he could be taken off some of his medications. His blood pressures running relatively low but I told him that certainly we do not want to stop lisinopril particularly in the setting of his diabetes mellitus, albeit with normal LV systolic function. Furosemide, amlodipine and in the next 4 weeks amiodarone can be discontinued. Mean TTR= 45% since November 2012. This certainly could be improved.       PMH: reviewed and listed in Problem List in Electronic Records (and see below) Past Medical History  Diagnosis Date  . Mixed hyperlipidemia   . Coronary atherosclerosis of native coronary artery     2 vessel s/p CABG initial surgery in 2004, status post redo coronary bypass grafting x3 2013  . Type 2 diabetes mellitus   . Aortic stenosis     Moderate  . Myocardial infarction 1998  . Essential hypertension, benign     on medicaton  . Atrial fibrillation 10/2011   unsuccessful CV 11/2011 status post Cox-Maze procedure February 2013  . Shortness of breath     exertional  . Sleep apnea 2010    wears CPAP nightly  . CHF (congestive heart failure) 11/2011    on Lasix to control edema  . Pneumonia 2010  . Diabetes mellitus     Type 2 NIDDM  X 10 years  . Hypothyroidism     on medication  . H/O hiatal hernia   . Arthritis     osteoarthritis  . Rotator cuff tear     Rt shoulder   Past Surgical History  Procedure Date  . Coronary artery bypass graft 04/02/2003    Dr. Cornelius Moras - SVG to OM, composite SVG/RIMA to PDA  . Cholecystectomy   . Cardioversion 11/18/2011  . Cardiac catheterization 12/21/2011  . Appendectomy   . Tonsillectomy   . Coronary artery bypass graft 01/12/2012    Procedure: REDO CORONARY ARTERY BYPASS GRAFTING (CABG);  Surgeon: Purcell Nails, MD;  Location: [facility] OR;  Service: Open Heart Surgery;  Laterality: N/A;  Three grafts; Endoscopically harvested left saphenous vein graft and left internal mammary artery  . Aortic valve replacement 01/12/2012    Procedure: AORTIC VALVE REPLACEMENT (AVR);  Surgeon: Purcell Nails, MD;  Location: [facility] OR;  Service: Open Heart Surgery;  Laterality: N/A;  NO NECKLINE ON THE RIGHT  . Maze 01/12/2012    Procedure: MAZE;  Surgeon: Purcell Nails, MD;  Location: [facility] OR;  Service: Open Heart Surgery;  Laterality: N/A;  NO NECKLINE ON THE RIGHT    Allergies/SH/FHX : available in Electronic Records for review  No Known Allergies History   Social History  . Marital Status: Married    Spouse Name: N/A    Number of Children: N/A  . Years of Education: N/A   Occupational History  . Commercial truck driver     Full time   Social History Main Topics  . Smoking status: Former Smoker -- 3.0 packs/day for 14 years    Types: Cigarettes    Quit date: 12/05/1982  . Smokeless tobacco: Never Used  . Alcohol Use: No  . Drug Use: No  . Sexually Active: Not on file   Other Topics Concern  . Not on file    Social History Narrative  . No narrative on file   No family history on file.  Medications: Current Outpatient Prescriptions  Medication Sig Dispense Refill  . amiodarone (PACERONE) 200 MG tablet Take 1 tablet (200 mg total) by mouth daily.  30 tablet  1  . aspirin EC 81 MG EC tablet Take 1 tablet (81 mg total) by mouth daily.      . folic acid (FOLVITE) 1 MG tablet Take 1 tablet (1 mg total) by mouth daily. For one month then stop.  30 tablet  0  . furosemide (LASIX) 40 MG tablet Take 1 tablet (40 mg total) by mouth daily.  30 tablet  0  . glyBURIDE-metformin (GLUCOVANCE) 2.5-500 MG per tablet Take 2 tablets by mouth 2 (two) times daily with a meal.       . levothyroxine (SYNTHROID, LEVOTHROID) 50 MCG tablet Take 50 mcg by mouth daily.       Marland Kitchen lisinopril (PRINIVIL,ZESTRIL) 10 MG tablet Take 1 tablet (10 mg total) by mouth daily.  30 tablet  1  . metoprolol tartrate (LOPRESSOR) 25 MG tablet Take 1 tablet (25 mg total) by mouth 2 (two) times daily.  60 tablet  1  . nitroGLYCERIN (NITROSTAT) 0.4 MG SL tablet Place 0.4 mg under the tongue every 5 (five) minutes as needed. For chest pain      . oxycodone (OXY-IR) 5 MG capsule Take 5 mg by mouth every 4 (four) hours as needed.      . polysaccharide iron (NIFEREX) 150 MG CAPS capsule Take 1 capsule (150 mg total) by mouth daily.  30 each  0  . potassium chloride SA (K-DUR,KLOR-CON) 20 MEQ tablet Take 1 tablet (20 mEq total) by mouth daily.  30 tablet  0  . VOLTAREN 1 % GEL Apply 1 application topically 4 (four) times daily as needed. For pain      . warfarin (COUMADIN) 5 MG tablet Take 0.5 tab (2.5 mg) daily or as directed by Coumadin Clinic  60 tablet  1    ROS: No nausea or vomiting. No fever or chills.No melena or hematochezia.No bleeding.No claudication. Vein graft harvesting site left leg well-healed. No pain complaints of the sternotomy site.  Physical Exam: BP 118/77  Pulse 80  Ht 5\' 8"  (1.727 m)  Wt 196 lb (88.905 kg)  BMI 29.80  kg/m2 General: Well-nourished white male in no apparent distress Neck: Normal carotid upstroke and no carotid bruits. No thyromegaly nonnodular thyroid. JVP is 7 cm Lungs: Clear breath sounds bilaterally with faint crackles at the bases particularly on the left side Cardiac: Regular rate and rhythm with normal closing click of S2. Otherwise normal S1 and very soft ejection murmur at the right upper sternal border but no pathological  murmurs. Well-healed sternotomy scar Vascular: No edema. Normal distal pulses bilaterally Skin: Warm and dry Physcologic: Normal affect  12lead ECG: Normal sinus rhythm with right bundle branch block Limited bedside ECHO:N/A No images are attached to the encounter. I would even generated at the next year and   Patient Active Problem List  Diagnoses  . HYPERLIPIDEMIA  . OBSTRUCTIVE SLEEP APNEA  . HYPERTENSION, BENIGN  . S/P CABG x 01-2003   . Aortic stenosis-resolved status post aVR   . CAD (coronary artery disease)-no recurrent angina   . A-fib-normal sinus rhythm on amiodarone and status post Cox-Maze procedure   . S/P aortic valve replacement  . S/P CABG x 3-redo surgery 2013   . S/P Maze operation for atrial fibrillation Right bundle branch block     PLAN   Patient overall is doing well but would like to cut down some of his medications. Given his relatively low blood pressure this is certainly an option.  Now that he has been  revascularized I stop amlodipine.  In addition the patient is in normal sinus rhythm and has been more than 6 weeks on amiodarone I told him that he can continue until his bottle is empty which is an additional 20 tablets. I understand Dr. Cornelius Moras was also in agreement with this  There is no evidence of fluid overload and the patient ejection fraction is normal. We will discontinue furosemide but he can take this when necessary with potassium tablet if he notices that he gains more than 2 or 3 pounds in a very short period of  time or notices lower extremity edema which I suspect will be unlikely.  Further improvement in INR level maintenance may need to be achieved as his TTR over the last 5-6 months is only 45%. Dr. Diona Browner can address this with the Coumadin clinic  The patient also will need release for work on 04/05/2012.

## 2012-02-20 ENCOUNTER — Other Ambulatory Visit: Payer: Self-pay | Admitting: Physician Assistant

## 2012-02-24 ENCOUNTER — Ambulatory Visit (INDEPENDENT_AMBULATORY_CARE_PROVIDER_SITE_OTHER): Payer: BC Managed Care – PPO | Admitting: *Deleted

## 2012-02-24 DIAGNOSIS — I4891 Unspecified atrial fibrillation: Secondary | ICD-10-CM

## 2012-02-24 DIAGNOSIS — Z7901 Long term (current) use of anticoagulants: Secondary | ICD-10-CM

## 2012-02-24 LAB — POCT INR: INR: 1.9

## 2012-02-27 ENCOUNTER — Other Ambulatory Visit: Payer: Self-pay | Admitting: Physician Assistant

## 2012-03-08 ENCOUNTER — Telehealth: Payer: Self-pay | Admitting: *Deleted

## 2012-03-08 NOTE — Telephone Encounter (Signed)
Pt states since medication was changed his BP is going up and up. It is up in the 160's now. This am it was 162/78 before exercise. After 1 machine it was 169/81. Post exercise was 164/75. Yesterday BP was 152/70 at rest and post exercise 146/79. He is concerned about increasing BP. Pt is aware message will be sent to Dr. Diona Browner for further review.

## 2012-03-09 ENCOUNTER — Ambulatory Visit (INDEPENDENT_AMBULATORY_CARE_PROVIDER_SITE_OTHER): Payer: BC Managed Care – PPO | Admitting: *Deleted

## 2012-03-09 DIAGNOSIS — Z7901 Long term (current) use of anticoagulants: Secondary | ICD-10-CM

## 2012-03-09 DIAGNOSIS — I4891 Unspecified atrial fibrillation: Secondary | ICD-10-CM

## 2012-03-09 LAB — POCT INR: INR: 2.8

## 2012-03-09 MED ORDER — AMLODIPINE BESYLATE 5 MG PO TABS: 5.0000 mg | ORAL_TABLET | Freq: Every day | ORAL | Status: DC

## 2012-03-09 NOTE — Telephone Encounter (Signed)
Attempted to reach pt to notify of Dr. Ival Bible response. Home number gave message that mailbox is full-unable to leave a message. Left message to call back on mobile voicemail.

## 2012-03-09 NOTE — Telephone Encounter (Signed)
Pt notified and verbalized understanding. RX sent for Amlodipine.

## 2012-03-09 NOTE — Telephone Encounter (Signed)
Reviewed records. He saw Dr. Andee Lineman in followup most recently for a post hospital review. Looks like Norvasc was discontinued at that time. For now would resume Norvasc at 5 mg daily, follow blood pressure.

## 2012-03-17 ENCOUNTER — Other Ambulatory Visit: Payer: Self-pay | Admitting: Physician Assistant

## 2012-03-19 ENCOUNTER — Telehealth: Payer: Self-pay | Admitting: *Deleted

## 2012-03-19 NOTE — Telephone Encounter (Signed)
Pt left message on voicemail stating [facility] was supposed to fax a record of the pt's BP's for the last couple of weeks. He would like these reviewed as he feels his BP is not coming down that much since starting new med.

## 2012-03-19 NOTE — Telephone Encounter (Signed)
I did review them earlier today and noted that he was recently back on Norvasc since 4/5. If blood pressure stays in current range into next week, consider increase Lisinopril to 20 mg daily next.

## 2012-03-19 NOTE — Telephone Encounter (Signed)
Pt will continue to monitor BP and notify if it remains elevated.

## 2012-03-23 ENCOUNTER — Other Ambulatory Visit: Payer: Self-pay | Admitting: Physician Assistant

## 2012-03-28 ENCOUNTER — Ambulatory Visit (INDEPENDENT_AMBULATORY_CARE_PROVIDER_SITE_OTHER): Payer: BC Managed Care – PPO | Admitting: Cardiology

## 2012-03-28 ENCOUNTER — Encounter: Payer: Self-pay | Admitting: Cardiology

## 2012-03-28 ENCOUNTER — Ambulatory Visit (INDEPENDENT_AMBULATORY_CARE_PROVIDER_SITE_OTHER): Payer: BC Managed Care – PPO | Admitting: *Deleted

## 2012-03-28 ENCOUNTER — Encounter: Payer: Self-pay | Admitting: *Deleted

## 2012-03-28 VITALS — BP 123/71 | HR 62 | Ht 68.0 in | Wt 195.0 lb

## 2012-03-28 DIAGNOSIS — I10 Essential (primary) hypertension: Secondary | ICD-10-CM

## 2012-03-28 DIAGNOSIS — I350 Nonrheumatic aortic (valve) stenosis: Secondary | ICD-10-CM

## 2012-03-28 DIAGNOSIS — I2510 Atherosclerotic heart disease of native coronary artery without angina pectoris: Secondary | ICD-10-CM

## 2012-03-28 DIAGNOSIS — Z7901 Long term (current) use of anticoagulants: Secondary | ICD-10-CM

## 2012-03-28 DIAGNOSIS — I359 Nonrheumatic aortic valve disorder, unspecified: Secondary | ICD-10-CM

## 2012-03-28 DIAGNOSIS — I4891 Unspecified atrial fibrillation: Secondary | ICD-10-CM

## 2012-03-28 LAB — POCT INR: INR: 2.4

## 2012-03-28 MED ORDER — POTASSIUM CHLORIDE CRYS ER 20 MEQ PO TBCR: 20.0000 meq | EXTENDED_RELEASE_TABLET | Freq: Every day | ORAL | Status: DC

## 2012-03-28 MED ORDER — LOSARTAN POTASSIUM 25 MG PO TABS: 25.0000 mg | ORAL_TABLET | Freq: Every day | ORAL | Status: DC

## 2012-03-28 MED ORDER — FUROSEMIDE 40 MG PO TABS: 40.0000 mg | ORAL_TABLET | Freq: Every day | ORAL | Status: DC

## 2012-03-28 NOTE — Assessment & Plan Note (Signed)
Maintaining sinus rhythm status post surgical MAZE, off amiodarone. He continues on Coumadin.

## 2012-03-28 NOTE — Patient Instructions (Signed)
   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

## 2012-03-28 NOTE — Assessment & Plan Note (Signed)
Blood pressure looks good today, although has been up and down based on record review. Plan is to discontinue lisinopril and try Cozaar beginning at 25 mg daily, titrating to adequate affect. Otherwise no changes.

## 2012-03-28 NOTE — Progress Notes (Signed)
Clinical Summary [name] is a 61 y.o.male presenting for followup. [name] was last seen by Dr. Andee Flowers in March. Subsequent blood pressure recordings have been reviewed with reinstitution of Norvasc for better blood pressure control. [name] has been exercising in the [facility] cardiac rehabilitation program.  [name] reports [name] has had a dry cough and also hoarseness, concerned that it may be related to lisinopril. We discussed some potential medication changes.  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 States that [name] will complete cardiac rehabilitation in a few weeks and is due to see Dr. Cornelius Flowers in about one week. [name] has been quite active including outdoor work, push mowing his yard, using a Scientist, physiological, tilling his garden.  Followup ECG reviewed showing sinus rhythm with ectopy, right bundle branch block, old inferior infarct pattern as before.    No Known Allergies  Current Outpatient Prescriptions  Medication Sig Dispense Refill  . amLODipine (NORVASC) 5 MG tablet Take 1 tablet (5 mg total) by mouth daily.  30 tablet  6  . aspirin EC 81 MG EC tablet Take 1 tablet (81 mg total) by mouth daily.      Marland Kitchen atorvastatin (LIPITOR) 20 MG tablet Take 1 tablet (20 mg total) by mouth daily.  30 tablet  6  . ferrous sulfate (FERRO-BOB) 325 (65 FE) MG tablet Take 325 mg by mouth daily with breakfast.      . folic acid (FOLVITE) 1 MG tablet Take 1 tablet (1 mg total) by mouth daily. For one month then stop.  30 tablet  0  . furosemide (LASIX) 40 MG tablet TAKE 1 TABLET BY MOUTH DAILY  30 tablet  0  . glyBURIDE-metformin (GLUCOVANCE) 2.5-500 MG per tablet Take 2 tablets by mouth 2 (two) times daily with a meal.       . levothyroxine (SYNTHROID, LEVOTHROID) 50 MCG tablet Take 50 mcg by mouth daily.       Marland Kitchen lisinopril (PRINIVIL,ZESTRIL) 10 MG tablet Take 1 tablet (10 mg total) by mouth daily.  30 tablet  1  . metoprolol tartrate (LOPRESSOR) 25 MG tablet Take 1 tablet (25 mg total) by mouth 2 (two) times  daily.  60 tablet  1  . nitroGLYCERIN (NITROSTAT) 0.4 MG SL tablet Place 0.4 mg under the tongue every 5 (five) minutes as needed. For chest pain      . potassium chloride SA (K-DUR,KLOR-CON) 20 MEQ tablet Take 1 tablet (20 mEq total) by mouth daily.  30 tablet  0  . VOLTAREN 1 % GEL Apply 1 application topically 4 (four) times daily as needed. For pain      . warfarin (COUMADIN) 5 MG tablet Take 0.5 tab (2.5 mg) daily or as directed by Coumadin Clinic  60 tablet  1    Past Medical History  Diagnosis Date  . Mixed hyperlipidemia   . Coronary atherosclerosis of native coronary artery     2 vessel s/p CABG initial surgery in 2004, status post redo coronary bypass grafting x 3 2013  . Type 2 diabetes mellitus   . Aortic stenosis   . Myocardial infarction 1998  . Essential hypertension, benign   . Atrial fibrillation     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 February 2013  . Sleep apnea     wears CPAP nightly  . Pneumonia 2010  . Type 2 diabetes mellitus   . Hypothyroidism   . H/O hiatal hernia   . Osteoarthritis   . Rotator cuff tear  Right shoulder    Past Surgical History  Procedure Date  . Coronary artery bypass graft 04/02/2003    Dr. Cornelius Flowers - SVG to OM, composite SVG/RIMA to PDA  . Cholecystectomy   . Cardioversion 11/18/2011  . Cardiac catheterization 12/21/2011  . Appendectomy   . Tonsillectomy   . Coronary artery bypass graft 01/12/2012    Procedure: REDO CORONARY ARTERY BYPASS GRAFTING (CABG);  Surgeon: Derrick Nails, MD;  Location: [facility] OR;  Service: Open Heart Surgery;  Laterality: N/A;  Three grafts; Endoscopically harvested left saphenous vein graft and left internal mammary artery  . Aortic valve replacement 01/12/2012    Procedure: AORTIC VALVE REPLACEMENT (AVR);  Surgeon: Derrick Nails, MD;  Location: [facility] OR;  Service: Open Heart Surgery;  Laterality: N/A;  NO NECKLINE ON THE RIGHT  . Maze 01/12/2012    Procedure: MAZE;  Surgeon: Derrick Nails, MD;   Location: [facility] OR;  Service: Open Heart Surgery;  Laterality: N/A;  NO NECKLINE ON THE RIGHT    Social History [name] reports that [name] quit smoking about 29 years ago. His smoking use included Cigarettes. [name] has a 42 pack-year smoking history. [name] has never used smokeless tobacco. [name] reports that [name] does not drink alcohol.  Review of Systems No bleeding problems. No palpitations. Sleeping better. Appetite stable. Has been watching his diet. Has lost nearly 30 pounds. Otherwise negative.  Physical Examination Filed Vitals:   03/28/12 1401  BP: 123/71  Pulse: 62   Normally nourished appearing male in no acute distress. HEENT: Conjunctiva and lids normal, oropharynx clear. Neck: Supple, no elevated JVP, no thyromegaly. Lungs: Clear to auscultation, nonlabored breathing at rest. Thorax: Well-healed sternal incision. Cardiac: Regular rate and rhythm, no S3, 2/6 systolic murmur with AV click, no pericardial rub. Abdomen: Soft, nontender, bowel sounds present, no guarding or rebound. Extremities: No pitting edema, distal pulses 1-2+. Skin: Warm and dry. Musculoskeletal: No kyphosis. Neuropsychiatric: Alert and oriented x3, affect grossly appropriate.     Problem List and Plan

## 2012-03-28 NOTE — Assessment & Plan Note (Signed)
Symptomatically stable on medical therapy following redo bypass. 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 He is also to see Dr. Cornelius Moras over the next week, who might be able to provide additional information in terms of lifting restrictions. Followup arranged over the next few months.

## 2012-03-28 NOTE — Assessment & Plan Note (Signed)
Status post AVR as outlined.

## 2012-04-02 ENCOUNTER — Ambulatory Visit (INDEPENDENT_AMBULATORY_CARE_PROVIDER_SITE_OTHER): Payer: Self-pay | Admitting: Thoracic Surgery (Cardiothoracic Vascular Surgery)

## 2012-04-02 ENCOUNTER — Encounter: Payer: Self-pay | Admitting: Thoracic Surgery (Cardiothoracic Vascular Surgery)

## 2012-04-02 ENCOUNTER — Other Ambulatory Visit: Payer: Self-pay | Admitting: Cardiology

## 2012-04-02 VITALS — BP 144/70 | HR 50 | Resp 16 | Ht 69.0 in | Wt 194.8 lb

## 2012-04-02 DIAGNOSIS — Z952 Presence of prosthetic heart valve: Secondary | ICD-10-CM

## 2012-04-02 DIAGNOSIS — Z951 Presence of aortocoronary bypass graft: Secondary | ICD-10-CM

## 2012-04-02 DIAGNOSIS — Z8679 Personal history of other diseases of the circulatory system: Secondary | ICD-10-CM

## 2012-04-02 DIAGNOSIS — Z9889 Other specified postprocedural states: Secondary | ICD-10-CM

## 2012-04-02 DIAGNOSIS — Z954 Presence of other heart-valve replacement: Secondary | ICD-10-CM

## 2012-04-02 NOTE — Patient Instructions (Signed)
Continue to monitor blood pressure carefully and contact Dr. Ival Bible office if it remains elevated in order to adjust medications. The patient may return for unrestricted physical activity as of 04/10/2012.

## 2012-04-02 NOTE — Progress Notes (Signed)
[address]            [name] [address]          [phone]     CARDIOTHORACIC SURGERY OFFICE NOTE  Referring Provider is Jonelle Sidle, MD PCP is Ignatius Specking., MD, MD   HPI:  Patient returns for followup status post redo coronary artery bypass grafting x3, aortic valve replacement using a mechanical valve, and Maze procedure on 01/12/2012. He was last seen here in the office on 02/06/2012. Since then he has continued to do well. He was just seen in the office last week by Dr. Diona Browner who continues to adjust his blood pressure medications. He is maintaining sinus rhythm. His almost completed the cardiac rehabilitation program and his exercise tolerance continues to improve. He is now doing quite well and out working in his yard without significant limitation. His stamina is not as good as it used to be, but he continues to improve everyday. He has no complaints.   Current Outpatient Prescriptions  Medication Sig Dispense Refill  . amLODipine (NORVASC) 5 MG tablet Take 1 tablet (5 mg total) by mouth daily.  30 tablet  6  . aspirin EC 81 MG EC tablet Take 1 tablet (81 mg total) by mouth daily.      Marland Kitchen atorvastatin (LIPITOR) 20 MG tablet Take 1 tablet (20 mg total) by mouth daily.  30 tablet  6  . ferrous sulfate (FERRO-BOB) 325 (65 FE) MG tablet Take 325 mg by mouth daily with breakfast.      . folic acid (FOLVITE) 1 MG tablet Take 1 tablet (1 mg total) by mouth daily. For one month then stop.  30 tablet  0  . furosemide (LASIX) 40 MG tablet Take 1 tablet (40 mg total) by mouth daily.  30 tablet  6  . glyBURIDE-metformin (GLUCOVANCE) 2.5-500 MG per tablet Take 2 tablets by mouth 2 (two) times daily with a meal.       . levothyroxine (SYNTHROID, LEVOTHROID) 50 MCG tablet Take 50 mcg by mouth daily.       Marland Kitchen losartan (COZAAR) 25 MG tablet Take 1 tablet (25 mg total) by mouth daily.  30 tablet  6  . metoprolol tartrate (LOPRESSOR) 25 MG tablet Take 1  tablet (25 mg total) by mouth 2 (two) times daily.  60 tablet  1  . nitroGLYCERIN (NITROSTAT) 0.4 MG SL tablet Place 0.4 mg under the tongue every 5 (five) minutes as needed. For chest pain      . potassium chloride SA (K-DUR,KLOR-CON) 20 MEQ tablet Take 1 tablet (20 mEq total) by mouth daily.  30 tablet  6  . Tamsulosin HCl (FLOMAX) 0.4 MG CAPS Take 0.4 mg by mouth 1 day or 1 dose.      . VOLTAREN 1 % GEL Apply 1 application topically 4 (four) times daily as needed. For pain      . warfarin (COUMADIN) 5 MG tablet Take 0.5 tab (2.5 mg) daily or as directed by Coumadin Clinic  60 tablet  1      Physical Exam:   BP 144/70  Pulse 50  Resp 16  Ht 5\' 9"  (1.753 m)  Wt 194 lb 12.8 oz (88.361 kg)  BMI 28.77 kg/m2  SpO2 96%  General:  Well-appearing  Chest:   Clear to auscultation  CV:   Regular rate and rhythm with mechanical heart  sounds  Incisions:  Completely healed. Sternum is stable.  Abdomen:  Soft and nontender  Extremities:  Warm and well-perfused  Diagnostic Tests:  2 channel telemetry rhythm strip demonstrates sinus rhythm   Impression:  Patient is doing well nearly 3 months status post redo coronary artery bypass grafting, aortic valve replacement using a mechanical prosthesis, and Maze procedure. He is tolerating Coumadin without difficulty. He is off amiodarone containing sinus rhythm.   Plan:  I think [name] can return to work and unrelated physical activity. 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 His blood pressure is still a little bit on the high side today. He may need to increase his dose of Cozaar. We will leave subsequent decisions regarding management of his blood pressure to Dr. Diona Browner and colleagues. We'll plan to see him back in 3 months for rhythm check.    Salvatore Decent. Cornelius Moras, MD 04/02/2012 11:07 AM

## 2012-04-05 ENCOUNTER — Telehealth: Payer: Self-pay | Admitting: *Deleted

## 2012-04-05 MED ORDER — LOSARTAN POTASSIUM 50 MG PO TABS: 50.0000 mg | ORAL_TABLET | Freq: Every day | ORAL | Status: DC

## 2012-04-05 NOTE — Telephone Encounter (Signed)
We just made adjustments in his medications at the last office visit. If he is tolerating Cozaar instead of prior ACE inhibitor with improved cough, suggest increasing Cozaar to 50 mg daily. I doubt however that his current blood pressure readings would be a reason to keep him out of work.  Jonelle Sidle, M.D., F.A.C.C.

## 2012-04-05 NOTE — Telephone Encounter (Signed)
Pt notified and verbalized understanding.

## 2012-04-05 NOTE — Telephone Encounter (Signed)
Pt states he has DOT physical for work next week. He is afraid that despite being released by Dr. Diona Browner and Dr. Cornelius Moras that he will not be able to return to work if BP remains elevated. It was 158/77 after rehab today and 144/68 before rehab. Pt states BP has been running 140-150's systolic.

## 2012-04-27 ENCOUNTER — Ambulatory Visit (INDEPENDENT_AMBULATORY_CARE_PROVIDER_SITE_OTHER): Payer: BC Managed Care – PPO | Admitting: *Deleted

## 2012-04-27 DIAGNOSIS — I4891 Unspecified atrial fibrillation: Secondary | ICD-10-CM

## 2012-04-27 DIAGNOSIS — Z7901 Long term (current) use of anticoagulants: Secondary | ICD-10-CM

## 2012-06-01 ENCOUNTER — Encounter: Payer: Self-pay | Admitting: Cardiology

## 2012-06-01 ENCOUNTER — Ambulatory Visit (INDEPENDENT_AMBULATORY_CARE_PROVIDER_SITE_OTHER): Payer: BC Managed Care – PPO | Admitting: *Deleted

## 2012-06-01 ENCOUNTER — Ambulatory Visit (INDEPENDENT_AMBULATORY_CARE_PROVIDER_SITE_OTHER): Payer: BC Managed Care – PPO | Admitting: Cardiology

## 2012-06-01 ENCOUNTER — Other Ambulatory Visit: Payer: Self-pay | Admitting: *Deleted

## 2012-06-01 VITALS — BP 146/78 | HR 68 | Ht 69.0 in | Wt 270.0 lb

## 2012-06-01 DIAGNOSIS — I10 Essential (primary) hypertension: Secondary | ICD-10-CM

## 2012-06-01 DIAGNOSIS — Z7901 Long term (current) use of anticoagulants: Secondary | ICD-10-CM

## 2012-06-01 DIAGNOSIS — I4891 Unspecified atrial fibrillation: Secondary | ICD-10-CM

## 2012-06-01 LAB — POCT INR: INR: 2

## 2012-06-01 MED ORDER — LOSARTAN POTASSIUM 100 MG PO TABS: 100.0000 mg | ORAL_TABLET | Freq: Every day | ORAL | Status: DC

## 2012-06-01 NOTE — Assessment & Plan Note (Signed)
Doing well, continues on Coumadin.

## 2012-06-01 NOTE — Assessment & Plan Note (Signed)
Symptomatically stable on medical therapy. Continue observation. I recommended that he try to continue a regular walking regimen.

## 2012-06-01 NOTE — Assessment & Plan Note (Signed)
Increase Cozaar to 100 mg daily, followup BMET in 2 weeks.

## 2012-06-01 NOTE — Progress Notes (Signed)
Clinical Summary [name] is a 61 y.o.male presenting for followup. He was seen in April. He also had interval followup with Dr. Cornelius Moras. He is back to work full time as a [employer]. States that he has been doing relatively well. He has had some orthopedic right shoulder discomfort. He is using a hand truck to assist with lifting and moving things.  He reports no palpitations, heart rhythm is regular today. Also reports compliance with his medications, including tolerating Cozaar which was new as of our last visit. No major bleeding problems on Coumadin.  Blood pressure is still not optimally controlled. He reports systolics in the 140s at home as well.   No Known Allergies  Current Outpatient Prescriptions  Medication Sig Dispense Refill  . amLODipine (NORVASC) 5 MG tablet Take 1 tablet (5 mg total) by mouth daily.  30 tablet  6  . aspirin EC 81 MG EC tablet Take 1 tablet (81 mg total) by mouth daily.      Marland Kitchen atorvastatin (LIPITOR) 20 MG tablet Take 1 tablet (20 mg total) by mouth daily.  30 tablet  6  . ferrous sulfate (FERRO-BOB) 325 (65 FE) MG tablet Take 325 mg by mouth daily with breakfast.      . folic acid (FOLVITE) 1 MG tablet Take 1 tablet (1 mg total) by mouth daily. For one month then stop.  30 tablet  0  . furosemide (LASIX) 40 MG tablet Take 1 tablet (40 mg total) by mouth daily.  30 tablet  6  . glyBURIDE-metformin (GLUCOVANCE) 2.5-500 MG per tablet Take 1 tablet by mouth 2 (two) times daily with a meal.       . levothyroxine (SYNTHROID, LEVOTHROID) 50 MCG tablet Take 50 mcg by mouth daily.       Marland Kitchen losartan (COZAAR) 100 MG tablet Take 1 tablet (100 mg total) by mouth daily.  30 tablet  3  . metoprolol tartrate (LOPRESSOR) 25 MG tablet TAKE 1 TABLET BY MOUTH TWICE DAILY  60 tablet  6  . nitroGLYCERIN (NITROSTAT) 0.4 MG SL tablet Place 0.4 mg under the tongue every 5 (five) minutes as needed. For chest pain      . potassium chloride SA (K-DUR,KLOR-CON) 20 MEQ tablet Take 1  tablet (20 mEq total) by mouth daily.  30 tablet  6  . Tamsulosin HCl (FLOMAX) 0.4 MG CAPS Take 0.4 mg by mouth 1 day or 1 dose.      . VOLTAREN 1 % GEL Apply 1 application topically 4 (four) times daily as needed. For pain      . warfarin (COUMADIN) 5 MG tablet Take 0.5 tab (2.5 mg) daily or as directed by Coumadin Clinic  60 tablet  1    Past Medical History  Diagnosis Date  . Mixed hyperlipidemia   . Coronary atherosclerosis of native coronary artery     2 vessel s/p CABG initial surgery in 2004, status post redo coronary bypass grafting x 3 2013  . Type 2 diabetes mellitus   . Aortic stenosis   . Myocardial infarction 1998  . Essential hypertension, benign   . Atrial fibrillation     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 February 2013  . Sleep apnea     wears CPAP nightly  . Pneumonia 2010  . Type 2 diabetes mellitus   . Hypothyroidism   . H/O hiatal hernia   . Osteoarthritis   . Rotator cuff tear     Right shoulder  Past Surgical History  Procedure Date  . Coronary artery bypass graft 04/02/2003    Dr. Cornelius Moras - SVG to OM, composite SVG/RIMA to PDA  . Cholecystectomy   . Cardioversion 11/18/2011  . Cardiac catheterization 12/21/2011  . Appendectomy   . Tonsillectomy   . Coronary artery bypass graft 01/12/2012    Procedure: REDO CORONARY ARTERY BYPASS GRAFTING (CABG);  Surgeon: Purcell Nails, MD;  Location: [facility] OR;  Service: Open Heart Surgery;  Laterality: N/A;  Three grafts; Endoscopically harvested left saphenous vein graft and left internal mammary artery  . Aortic valve replacement 01/12/2012    Procedure: AORTIC VALVE REPLACEMENT (AVR);  Surgeon: Purcell Nails, MD;  Location: [facility] OR;  Service: Open Heart Surgery;  Laterality: N/A;  NO NECKLINE ON THE RIGHT  . Maze 01/12/2012    Procedure: MAZE;  Surgeon: Purcell Nails, MD;  Location: [facility] OR;  Service: Open Heart Surgery;  Laterality: N/A;  NO NECKLINE ON THE RIGHT    Social History [name]  reports that he quit smoking about 29 years ago. His smoking use included Cigarettes. He has a 42 pack-year smoking history. He has never used smokeless tobacco. [name] reports that he does not drink alcohol.  Review of Systems Negative except as outlined above.  Physical Examination Filed Vitals:   06/01/12 1102  BP: 146/78  Pulse: 68    Normally nourished appearing male in no acute distress.  HEENT: Conjunctiva and lids normal, oropharynx clear.  Neck: Supple, no elevated JVP, no thyromegaly.  Lungs: Clear to auscultation, nonlabored breathing at rest.  Thorax: Well-healed sternal incision.  Cardiac: Regular rate and rhythm, no S3, 2/6 systolic murmur with AV click, no pericardial rub.  Abdomen: Soft, nontender, bowel sounds present, no guarding or rebound.  Extremities: No pitting edema, distal pulses 1-2+.     Problem List and Plan   Coronary atherosclerosis of native coronary artery Symptomatically stable on medical therapy. Continue observation. I recommended that he try to continue a regular walking regimen.  S/P aortic valve replacement Doing well, continues on Coumadin.  S/P Maze operation for atrial fibrillation No obvious recurrence of atrial fibrillation.  HYPERTENSION, BENIGN Increase Cozaar to 100 mg daily, followup BMET in 2 weeks.    Jonelle Sidle, M.D., F.A.C.C.

## 2012-06-01 NOTE — Patient Instructions (Addendum)
Your physician recommends that you schedule a follow-up appointment in: 3 months with Dr. Diona Browner.   Your physician has recommended you make the following change in your medication:increase losartan to 100 mg daily. You may take (2) of your 50 mg tablets until they are finished. Your new prescription has been sent to your pharmacy.  Your physician recommends that you return for lab work in: 2 weeks around 06/15/12 at [facility] for BMET.

## 2012-06-01 NOTE — Assessment & Plan Note (Signed)
No obvious recurrence of atrial fibrillation.

## 2012-06-18 ENCOUNTER — Telehealth: Payer: Self-pay | Admitting: *Deleted

## 2012-06-18 NOTE — Telephone Encounter (Signed)
@  10:34am message left on nurse's voicemail to call. Nurse returned call and left message on voicemail.

## 2012-06-19 ENCOUNTER — Encounter: Payer: Self-pay | Admitting: *Deleted

## 2012-06-19 NOTE — Telephone Encounter (Signed)
Spoke with patient and he wanted to make sure that if he does requested lab work on this Friday, if this would be okay. Nurse advised him that it would be just fine.

## 2012-06-19 NOTE — Telephone Encounter (Signed)
This encounter was created in error - please disregard.

## 2012-06-26 ENCOUNTER — Other Ambulatory Visit: Payer: Self-pay | Admitting: Cardiology

## 2012-06-26 NOTE — Telephone Encounter (Signed)
Called patient to inquire why BMET still isn't done and patient stated he didn't get home til late on Friday night and he would go have labs done on this Friday 06/29/12.

## 2012-06-27 NOTE — Progress Notes (Signed)
Spoke with Vernona Rieger at [facility] and she stated they did wrong test and she will call patient to have him come back for correct test which is BMET. She stated patient will be credited.

## 2012-07-02 ENCOUNTER — Ambulatory Visit: Payer: BC Managed Care – PPO | Admitting: Thoracic Surgery (Cardiothoracic Vascular Surgery)

## 2012-07-02 ENCOUNTER — Ambulatory Visit (INDEPENDENT_AMBULATORY_CARE_PROVIDER_SITE_OTHER): Payer: BC Managed Care – PPO | Admitting: *Deleted

## 2012-07-02 DIAGNOSIS — I4891 Unspecified atrial fibrillation: Secondary | ICD-10-CM

## 2012-07-02 DIAGNOSIS — Z7901 Long term (current) use of anticoagulants: Secondary | ICD-10-CM

## 2012-07-03 ENCOUNTER — Telehealth: Payer: Self-pay | Admitting: *Deleted

## 2012-07-03 NOTE — Telephone Encounter (Signed)
Patient wants to start xarelto which was recommended by [name] since patient is not able to come in for INR checks regularly due to his job and being out of town all the time. Patient has checked with his insurance company and informed that he can get a 90 day supply for $80 if sent to mail order pharmacy. Patient informed to call office back with name of mail order pharmacy. Please advise if patient can be switched over to xarelto.

## 2012-07-03 NOTE — Telephone Encounter (Signed)
Patient informed. 

## 2012-07-03 NOTE — Telephone Encounter (Signed)
Patient and [name] informed.

## 2012-07-03 NOTE — Telephone Encounter (Signed)
Message copied by Eustace Moore on Tue Jul 03, 2012  2:27 PM ------      Message from: Jonelle Sidle      Created: Tue Jul 03, 2012 12:52 PM       Renal function stable, creatinine 1.0, potassium 4.2. No change current regimen.

## 2012-07-03 NOTE — Telephone Encounter (Signed)
No he should not switch to Xarelto, and should stay on Coumadin. He is not only on this for atrial fibrillation, but has a mechanical aortic valve in place.

## 2012-07-10 IMAGING — CR DG CHEST 1V PORT
1 series · 1 of 1 positions shown · non-contrast
Comparison: 01/13/2012.

CLINICAL DATA: Bypass surgery.

PORTABLE CHEST - 1 VIEW

[AP]
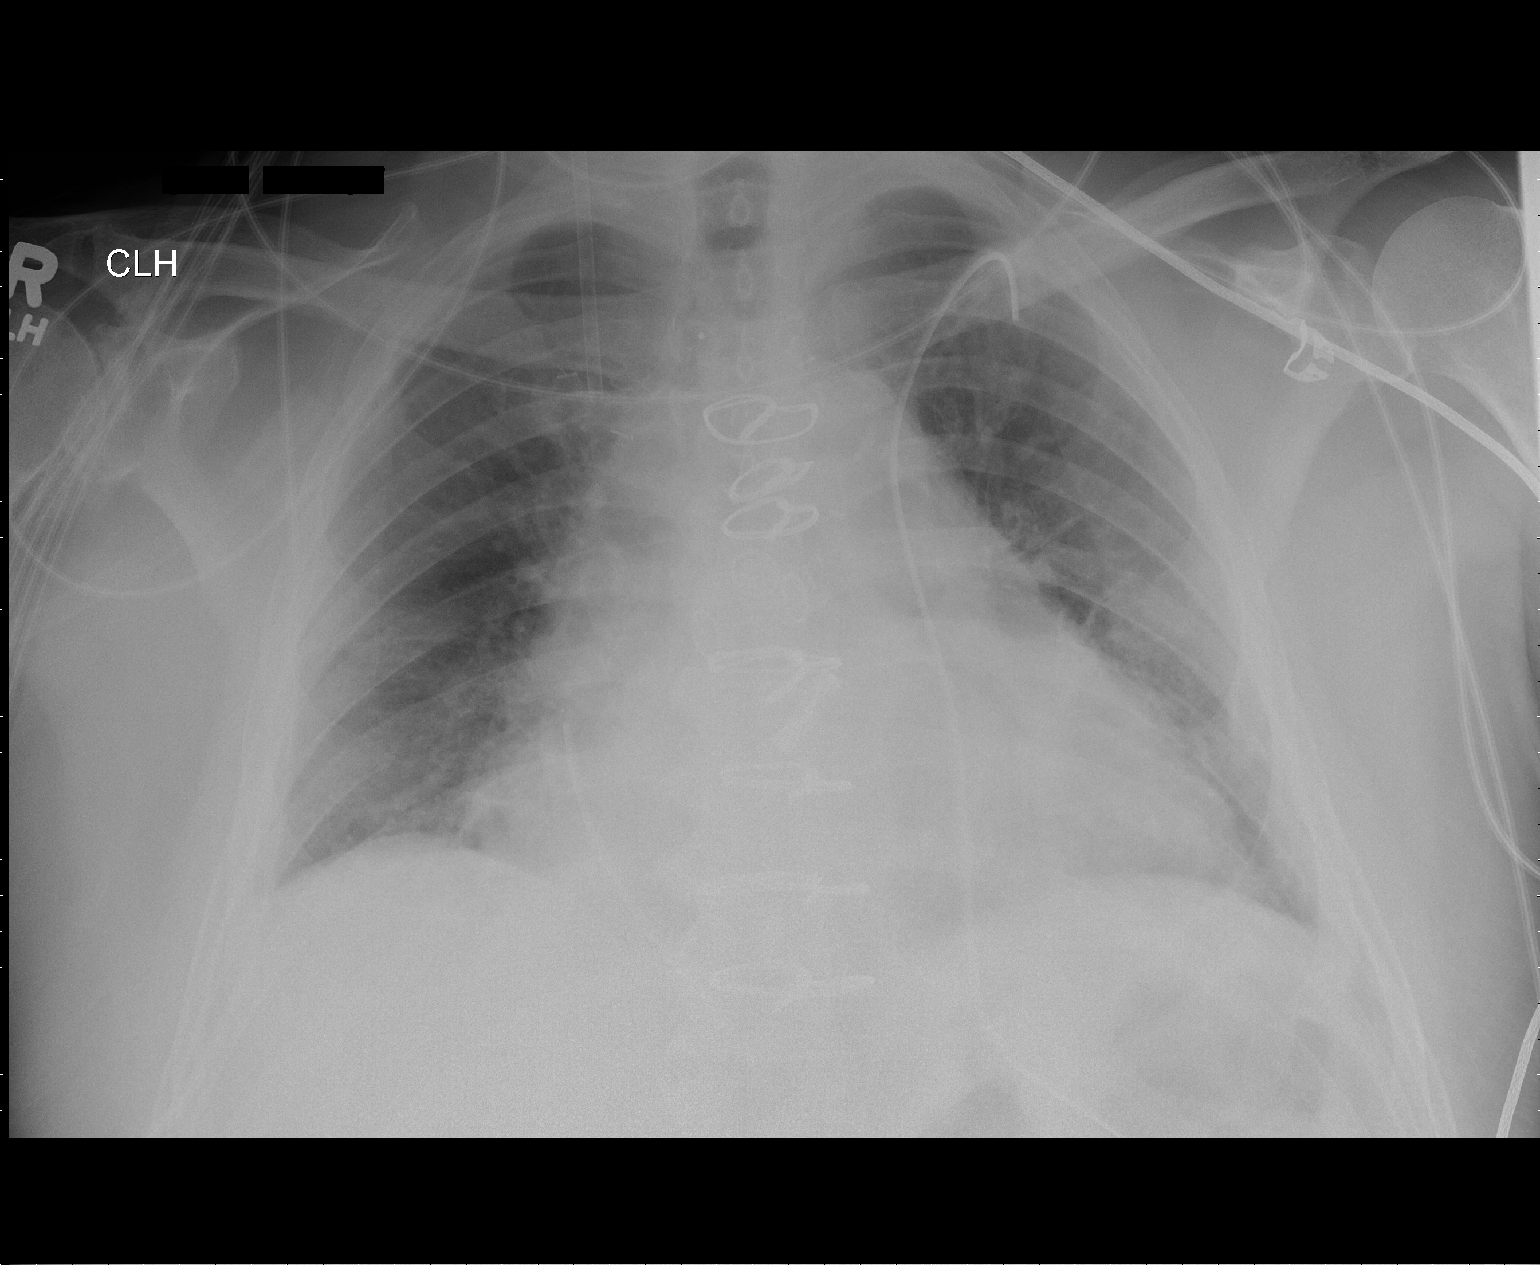

[1 of 1 positions shown; findings below may reference images not displayed]

FINDINGS: The Swan-Ganz catheter has been removed.  The right IJ
Cordis is still in place.  The left-sided chest tube is stable.  No
pneumothorax.  A single mediastinal drain tube is still in place.
Persistent vascular congestion and areas of subsegmental
atelectasis.  No definite pleural effusion or pulmonary edema.
IMPRESSION: 1.  Removal of Swan-Ganz catheter.
2.  Persistent vascular congestion and areas of atelectasis.  No
pneumothorax.

## 2012-07-23 ENCOUNTER — Ambulatory Visit: Payer: BC Managed Care – PPO | Admitting: Thoracic Surgery (Cardiothoracic Vascular Surgery)

## 2012-07-23 ENCOUNTER — Ambulatory Visit (INDEPENDENT_AMBULATORY_CARE_PROVIDER_SITE_OTHER): Payer: BC Managed Care – PPO | Admitting: *Deleted

## 2012-07-23 DIAGNOSIS — I4891 Unspecified atrial fibrillation: Secondary | ICD-10-CM

## 2012-07-23 DIAGNOSIS — Z7901 Long term (current) use of anticoagulants: Secondary | ICD-10-CM

## 2012-08-02 IMAGING — CR DG CHEST 2V
2 series · 2 of 2 positions shown · non-contrast
Comparison: Chest x-ray of 01/15/2012 and CT chest of 01/20/2012

CLINICAL DATA: Post CABG, follow-up

CHEST - 2 VIEW

[view not recorded (1 of 2)]
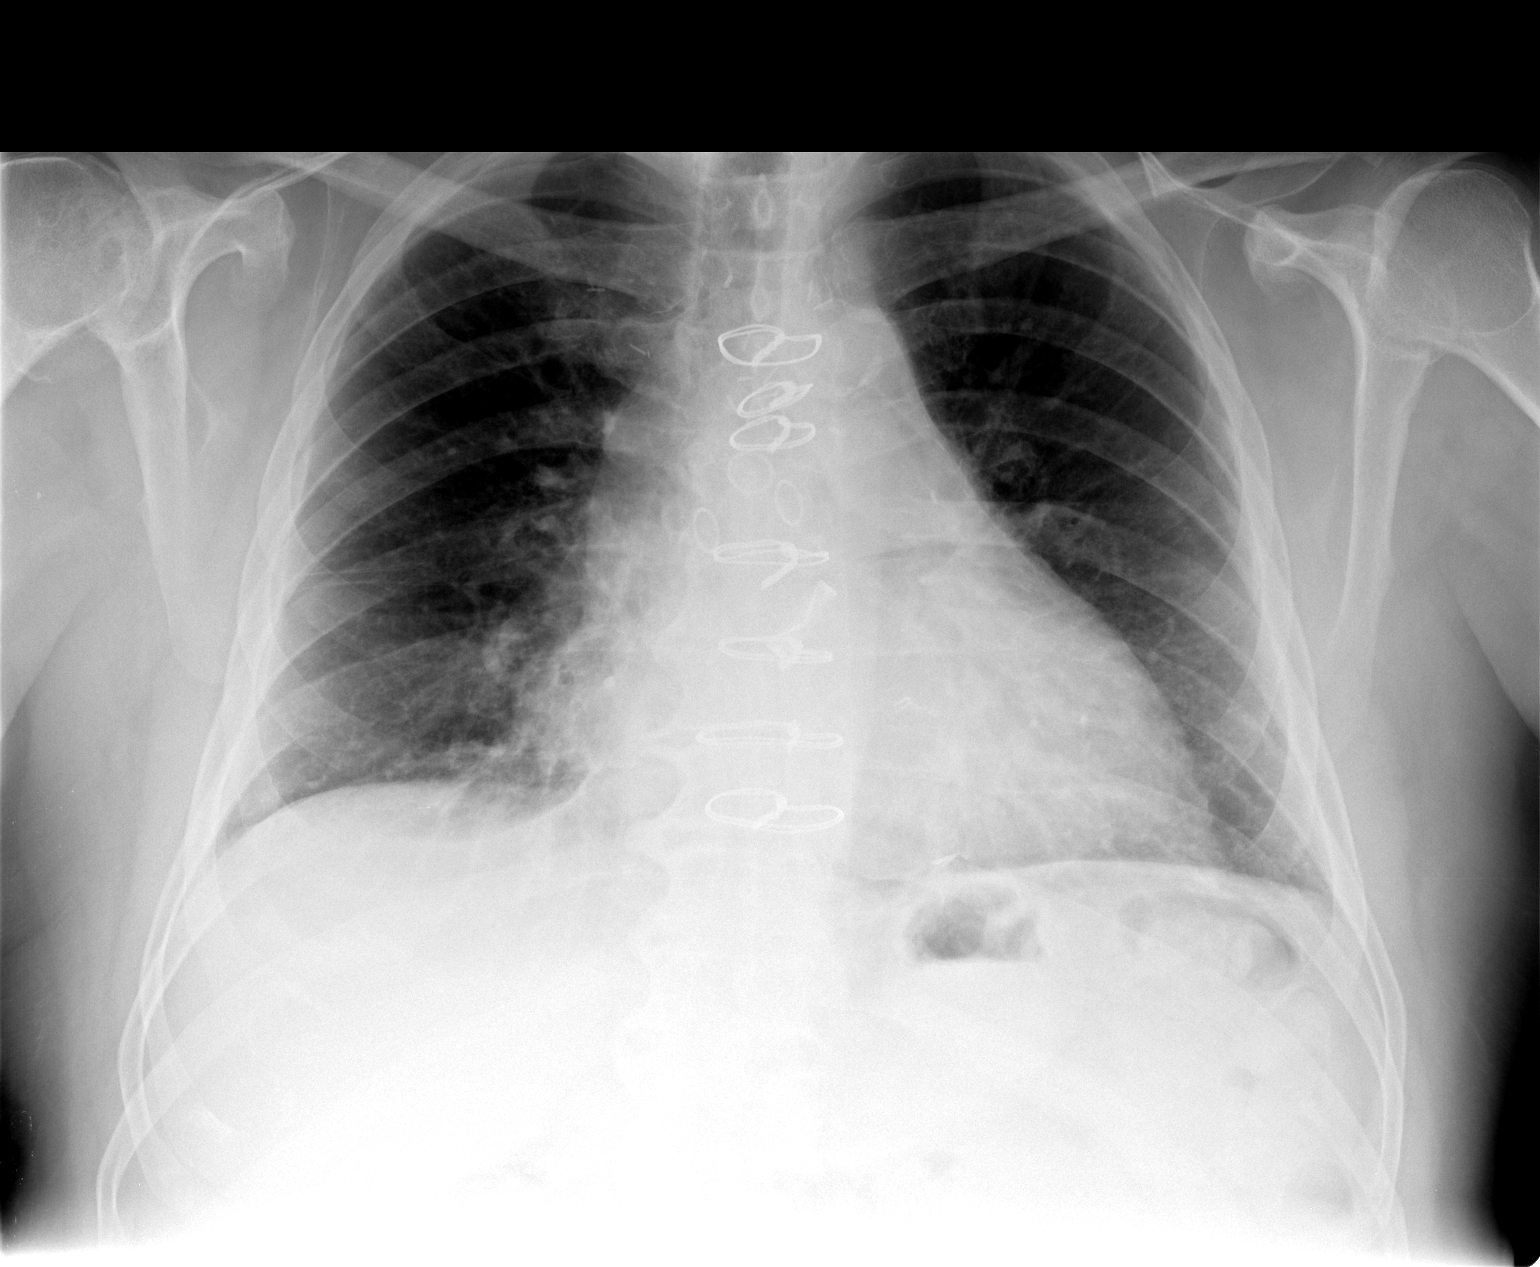

[view not recorded (2 of 2)]
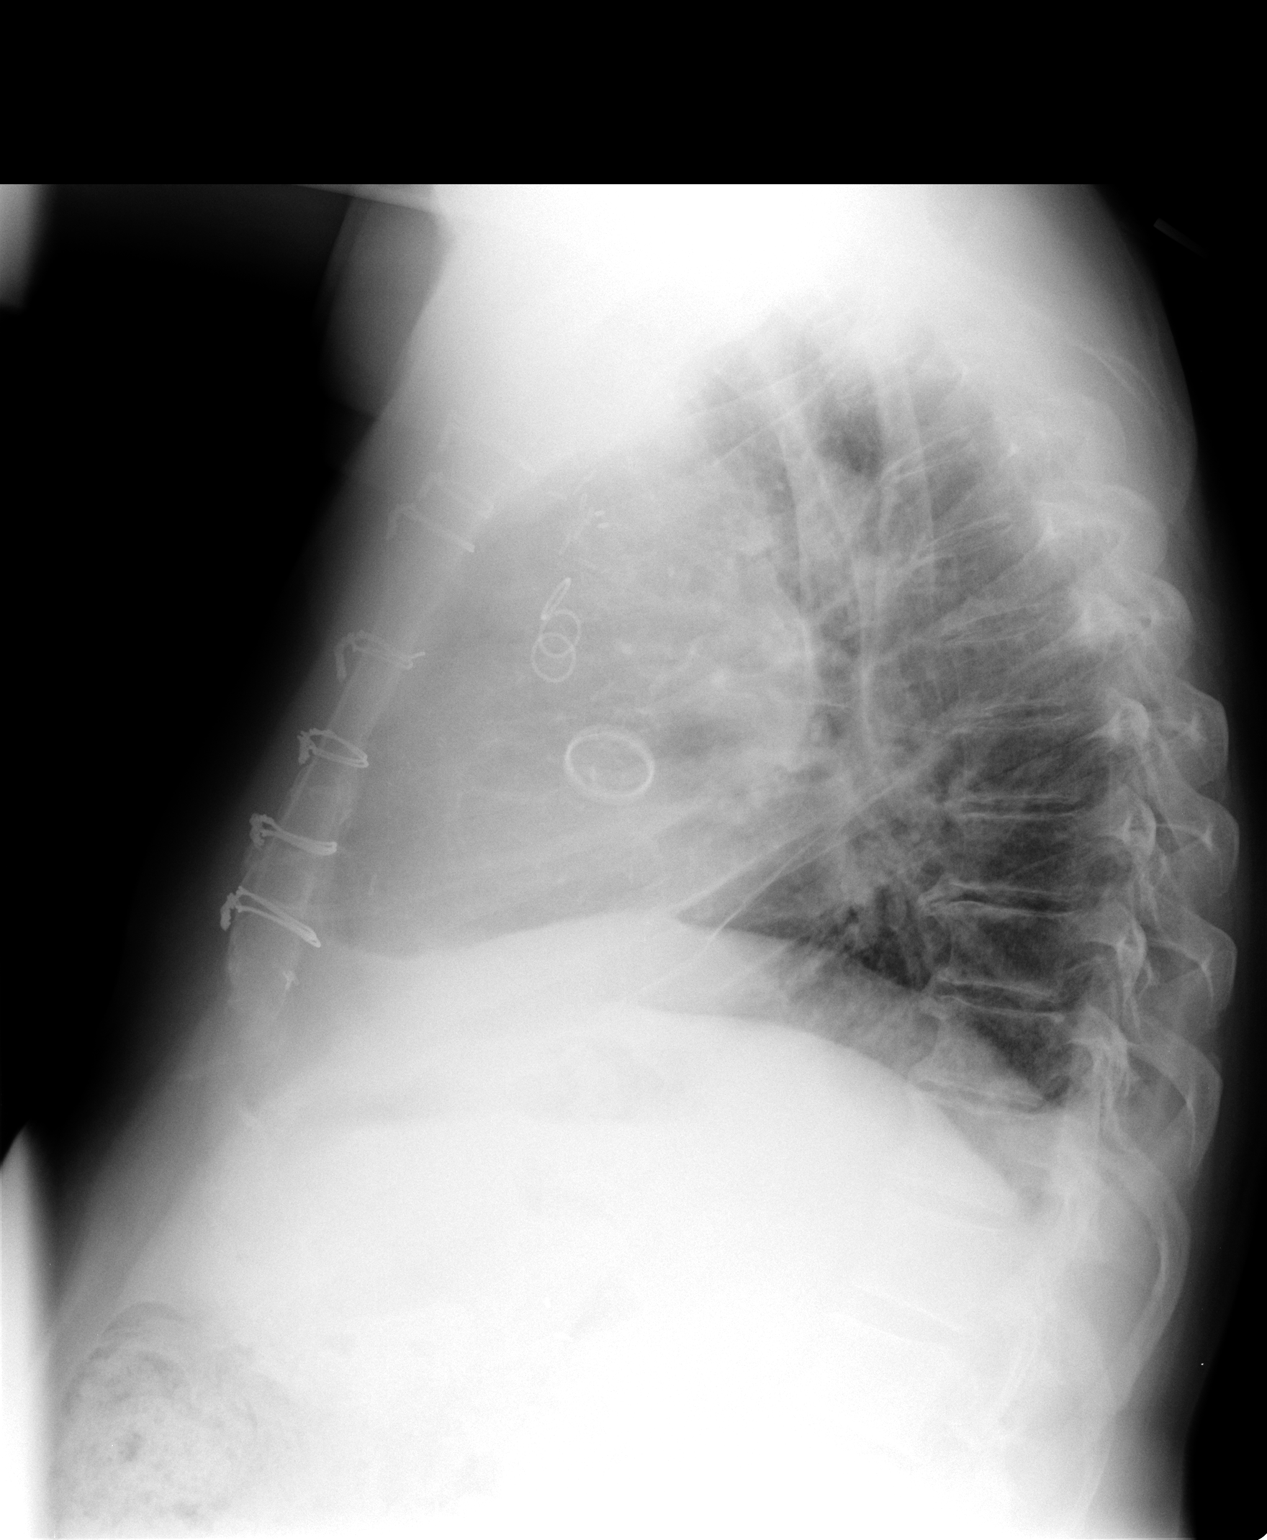

[2 of 2 positions shown; findings below may reference images not displayed]

FINDINGS: Aeration has improved.  The small effusion has resolved.
Cardiomegaly is stable.  Median sternotomy sutures are noted and
aortic valve replacement is unchanged.  There are degenerative
changes in the lower thoracic spine.
IMPRESSION: Improved aeration with resolution of small effusion.  Stable
cardiomegaly.

## 2012-08-11 ENCOUNTER — Other Ambulatory Visit: Payer: Self-pay | Admitting: Physician Assistant

## 2012-08-27 ENCOUNTER — Ambulatory Visit (INDEPENDENT_AMBULATORY_CARE_PROVIDER_SITE_OTHER): Payer: BC Managed Care – PPO | Admitting: *Deleted

## 2012-08-27 DIAGNOSIS — I4891 Unspecified atrial fibrillation: Secondary | ICD-10-CM

## 2012-08-27 DIAGNOSIS — Z7901 Long term (current) use of anticoagulants: Secondary | ICD-10-CM

## 2012-08-30 ENCOUNTER — Ambulatory Visit: Payer: BC Managed Care – PPO | Admitting: Cardiology

## 2012-08-31 ENCOUNTER — Ambulatory Visit: Payer: BC Managed Care – PPO | Admitting: Cardiology

## 2012-09-03 ENCOUNTER — Ambulatory Visit (INDEPENDENT_AMBULATORY_CARE_PROVIDER_SITE_OTHER): Payer: BC Managed Care – PPO | Admitting: *Deleted

## 2012-09-03 DIAGNOSIS — Z7901 Long term (current) use of anticoagulants: Secondary | ICD-10-CM

## 2012-09-03 DIAGNOSIS — I4891 Unspecified atrial fibrillation: Secondary | ICD-10-CM

## 2012-09-03 LAB — POCT INR: INR: 2.6

## 2012-09-08 ENCOUNTER — Other Ambulatory Visit: Payer: Self-pay | Admitting: Cardiology

## 2012-09-17 ENCOUNTER — Telehealth: Payer: Self-pay | Admitting: *Deleted

## 2012-09-17 ENCOUNTER — Ambulatory Visit: Payer: Self-pay | Admitting: *Deleted

## 2012-09-17 DIAGNOSIS — Z7901 Long term (current) use of anticoagulants: Secondary | ICD-10-CM

## 2012-09-17 DIAGNOSIS — I4891 Unspecified atrial fibrillation: Secondary | ICD-10-CM

## 2012-09-17 NOTE — Telephone Encounter (Signed)
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 States he was told by our office that he would have to get this done when [name] is here. He said that will Not work for him and that he is transferring his coumdin care to Dr. Sherril Croon office. They will check it when he is able to be in town.

## 2012-10-06 ENCOUNTER — Other Ambulatory Visit: Payer: Self-pay | Admitting: Cardiology

## 2012-10-22 ENCOUNTER — Other Ambulatory Visit: Payer: Self-pay | Admitting: Cardiology

## 2012-10-22 MED ORDER — LOSARTAN POTASSIUM 100 MG PO TABS: 100.0000 mg | ORAL_TABLET | Freq: Every day | ORAL | Status: DC

## 2012-10-22 MED ORDER — METOPROLOL TARTRATE 25 MG PO TABS: 25.0000 mg | ORAL_TABLET | Freq: Two times a day (BID) | ORAL | Status: DC

## 2012-10-22 MED ORDER — AMLODIPINE BESYLATE 5 MG PO TABS: 5.0000 mg | ORAL_TABLET | Freq: Every day | ORAL | Status: AC

## 2012-10-22 MED ORDER — FUROSEMIDE 40 MG PO TABS: 40.0000 mg | ORAL_TABLET | Freq: Every day | ORAL | Status: AC

## 2012-10-22 MED ORDER — ATORVASTATIN CALCIUM 20 MG PO TABS: 20.0000 mg | ORAL_TABLET | Freq: Every day | ORAL | Status: AC

## 2012-10-29 ENCOUNTER — Ambulatory Visit: Payer: BC Managed Care – PPO | Admitting: Cardiology

## 2012-12-24 ENCOUNTER — Encounter: Payer: Self-pay | Admitting: Cardiology

## 2012-12-24 ENCOUNTER — Ambulatory Visit (INDEPENDENT_AMBULATORY_CARE_PROVIDER_SITE_OTHER): Payer: BC Managed Care – PPO | Admitting: Cardiology

## 2012-12-24 VITALS — BP 110/72 | HR 73 | Ht 68.0 in | Wt 209.0 lb

## 2012-12-24 DIAGNOSIS — I10 Essential (primary) hypertension: Secondary | ICD-10-CM

## 2012-12-24 DIAGNOSIS — Z954 Presence of other heart-valve replacement: Secondary | ICD-10-CM

## 2012-12-24 DIAGNOSIS — Z952 Presence of prosthetic heart valve: Secondary | ICD-10-CM

## 2012-12-24 DIAGNOSIS — I2510 Atherosclerotic heart disease of native coronary artery without angina pectoris: Secondary | ICD-10-CM

## 2012-12-24 DIAGNOSIS — Z9889 Other specified postprocedural states: Secondary | ICD-10-CM

## 2012-12-24 DIAGNOSIS — Z8679 Personal history of other diseases of the circulatory system: Secondary | ICD-10-CM

## 2012-12-24 NOTE — Patient Instructions (Signed)
Continue all current medications. Your physician wants you to follow up in: 6 months.  You will receive a reminder letter in the mail one-two months in advance.  If you don't receive a letter, please call our office to schedule the follow up appointment   

## 2012-12-24 NOTE — Assessment & Plan Note (Signed)
In sinus rhythm with PACs.

## 2012-12-24 NOTE — Assessment & Plan Note (Signed)
Good blood pressure control today. 

## 2012-12-24 NOTE — Assessment & Plan Note (Signed)
Clinically stable with multivessel disease status post redo CABG in 2/13. Continue medical therapy and observation.

## 2012-12-24 NOTE — Progress Notes (Signed)
Clinical Summary Mr. Cudworth is a 62 y.o.male presenting for followup. He was seen in June 2013. He continues to drive a truck, doing well at baseline with no regular angina symptoms, NYHA class II. He reports compliance with his medications. Lipids have been followed by Dr. Sherril Croon.  Lab work from July 2013 showed BUN 28, creatinine 1.0, potassium 4.2. ECG today shows sinus rhythm with PACs, right bundle branch block, inferolateral infarct pattern, old.  No bleeding problems on Coumadin.  No Known Allergies  Current Outpatient Prescriptions  Medication Sig Dispense Refill  . amLODipine (NORVASC) 5 MG tablet Take 1 tablet (5 mg total) by mouth daily.  90 tablet  1  . aspirin EC 81 MG EC tablet Take 1 tablet (81 mg total) by mouth daily.      Marland Kitchen atorvastatin (LIPITOR) 20 MG tablet Take 1 tablet (20 mg total) by mouth daily.  90 tablet  1  . ferrous sulfate (FERRO-BOB) 325 (65 FE) MG tablet Take 325 mg by mouth daily with breakfast.      . folic acid (FOLVITE) 1 MG tablet Take 1 tablet (1 mg total) by mouth daily. For one month then stop.  30 tablet  0  . furosemide (LASIX) 40 MG tablet Take 1 tablet (40 mg total) by mouth daily.  90 tablet  1  . glyBURIDE-metformin (GLUCOVANCE) 5-500 MG per tablet Take 2 tablets by mouth 2 (two) times daily.      Marland Kitchen levothyroxine (SYNTHROID, LEVOTHROID) 75 MCG tablet Take 75 mcg by mouth daily.      Marland Kitchen losartan (COZAAR) 100 MG tablet Take 1 tablet (100 mg total) by mouth daily.  90 tablet  1  . metoprolol tartrate (LOPRESSOR) 25 MG tablet Take 1 tablet (25 mg total) by mouth 2 (two) times daily.  180 tablet  1  . nitroGLYCERIN (NITROSTAT) 0.4 MG SL tablet Place 0.4 mg under the tongue every 5 (five) minutes as needed. For chest pain      . potassium chloride SA (K-DUR,KLOR-CON) 20 MEQ tablet Take 1 tablet (20 mEq total) by mouth daily.  30 tablet  6  . Tamsulosin HCl (FLOMAX) 0.4 MG CAPS Take 0.4 mg by mouth 1 day or 1 dose.      . VOLTAREN 1 % GEL Apply 1  application topically 4 (four) times daily as needed. For pain      . warfarin (COUMADIN) 5 MG tablet Take 0.5 tab (2.5 mg) daily or as directed by Coumadin Clinic  60 tablet  1    Past Medical History  Diagnosis Date  . Mixed hyperlipidemia   . Coronary atherosclerosis of native coronary artery     2 vessel s/p CABG initial surgery in 2004, status post redo coronary bypass grafting x 3 2013  . Type 2 diabetes mellitus   . Aortic stenosis   . Myocardial infarction 1998  . Essential hypertension, benign   . Atrial fibrillation     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 February 2013  . Sleep apnea     wears CPAP nightly  . Pneumonia 2010  . Type 2 diabetes mellitus   . Hypothyroidism   . H/O hiatal hernia   . Osteoarthritis   . Rotator cuff tear     Right shoulder    Past Surgical History  Procedure Date  . Coronary artery bypass graft 04/02/2003    Dr. Cornelius Moras - SVG to OM, composite SVG/RIMA to PDA  . Cholecystectomy   . Cardioversion 11/18/2011  .  Cardiac catheterization 12/21/2011  . Appendectomy   . Tonsillectomy   . Coronary artery bypass graft 01/12/2012    Procedure: REDO CORONARY ARTERY BYPASS GRAFTING (CABG);  Surgeon: Purcell Nails, MD;  Location: [facility] OR;  Service: Open Heart Surgery;  Laterality: N/A;  Three grafts; Endoscopically harvested left saphenous vein graft and left internal mammary artery  . Aortic valve replacement 01/12/2012    Procedure: AORTIC VALVE REPLACEMENT (AVR);  Surgeon: Purcell Nails, MD;  Location: [facility] OR;  Service: Open Heart Surgery;  Laterality: N/A;  NO NECKLINE ON THE RIGHT  . Maze 01/12/2012    Procedure: MAZE;  Surgeon: Purcell Nails, MD;  Location: [facility] OR;  Service: Open Heart Surgery;  Laterality: N/A;  NO NECKLINE ON THE RIGHT    Family History  Problem Relation Age of Onset  . Cancer Father     Social History Mr. Berrios reports that he quit smoking about 30 years ago. His smoking use included Cigarettes. He has a 42  pack-year smoking history. He has never used smokeless tobacco. Mr. Lienhard reports that he does not drink alcohol.  Review of Systems No palpitations, dizziness, syncope. Stable appetite. No claudication. Otherwise negative.  Physical Examination Filed Vitals:   12/24/12 1357  BP: 110/72  Pulse: 73   Filed Weights   12/24/12 1357  Weight: 209 lb (94.802 kg)    Normally nourished appearing male in no acute distress.  HEENT: Conjunctiva and lids normal, oropharynx clear.  Neck: Supple, no elevated JVP, no thyromegaly.  Lungs: Clear to auscultation, nonlabored breathing at rest.  Thorax: Well-healed sternal incision.  Cardiac: Regular rate and rhythm, no S3, 2/6 systolic murmur with AV click, no pericardial rub.  Abdomen: Soft, nontender, bowel sounds present, no guarding or rebound.  Extremities: No pitting edema, distal pulses 1-2+.    Problem List and Plan   Coronary atherosclerosis of native coronary artery Clinically stable with multivessel disease status post redo CABG in 2/13. Continue medical therapy and observation.  S/P aortic valve replacement Stable status post mechanical aortic valve. Continue Coumadin and follow up in the Coumadin clinic.  HYPERTENSION, BENIGN Good blood pressure control today.  S/P Maze operation for atrial fibrillation In sinus rhythm with PACs.    Jonelle Sidle, M.D., F.A.C.C.

## 2012-12-24 NOTE — Assessment & Plan Note (Signed)
Stable status post mechanical aortic valve. Continue Coumadin and follow up in the Coumadin clinic.

## 2013-06-28 ENCOUNTER — Ambulatory Visit (INDEPENDENT_AMBULATORY_CARE_PROVIDER_SITE_OTHER): Payer: BC Managed Care – PPO | Admitting: Cardiology

## 2013-06-28 ENCOUNTER — Encounter: Payer: Self-pay | Admitting: Cardiology

## 2013-06-28 VITALS — BP 148/75 | HR 67 | Ht 68.5 in | Wt 217.4 lb

## 2013-06-28 DIAGNOSIS — Z952 Presence of prosthetic heart valve: Secondary | ICD-10-CM

## 2013-06-28 DIAGNOSIS — Z954 Presence of other heart-valve replacement: Secondary | ICD-10-CM

## 2013-06-28 DIAGNOSIS — Z9889 Other specified postprocedural states: Secondary | ICD-10-CM

## 2013-06-28 DIAGNOSIS — I10 Essential (primary) hypertension: Secondary | ICD-10-CM

## 2013-06-28 DIAGNOSIS — I2510 Atherosclerotic heart disease of native coronary artery without angina pectoris: Secondary | ICD-10-CM

## 2013-06-28 NOTE — Assessment & Plan Note (Signed)
Blood pressure is mildly elevated. Keep an eye on this trend with Dr. Sherril Croon. No changes made to current regimen.

## 2013-06-28 NOTE — Patient Instructions (Addendum)

## 2013-06-28 NOTE — Assessment & Plan Note (Signed)
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 Sherril Croon. Stable exam.

## 2013-06-28 NOTE — Progress Notes (Signed)
Clinical Summary [name] is a 62 y.o.male last seen in January. He reports no angina symptoms or progressive shortness of breath. Still driving a truck. He reports compliance with his medications, has had some difficulty with controlling his glucose. He just recently saw Dr. Sherril Croon.  I reviewed his medications and compared it to the medication list from [facility] internal medicine. We had previously had him on Cozaar, looks like this was switched to lisinopril in the meanwhile. He is no longer on amiodarone which was listed on the EIM list. I asked him to make sure he reviewed his medication bottles at home and call us back if there any further discrepancies.   He reports no bleeding problems with Coumadin, this is followed by Dr. Sherril Croon. No sense of palpitations.  ECG today shows sinus rhythm with right bundle branch block and frequent PACs, old inferior infarct pattern.    No Known Allergies  Current Outpatient Prescriptions  Medication Sig Dispense Refill  . amLODipine (NORVASC) 5 MG tablet Take 1 tablet (5 mg total) by mouth daily.  90 tablet  1  . aspirin 81 MG tablet Take 81 mg by mouth daily.      Marland Kitchen atorvastatin (LIPITOR) 20 MG tablet Take 1 tablet (20 mg total) by mouth daily.  90 tablet  1  . Canagliflozin (INVOKANA) 300 MG TABS Take 300 mg by mouth daily.      . furosemide (LASIX) 40 MG tablet Take 1 tablet (40 mg total) by mouth daily.  90 tablet  1  . glyBURIDE-metformin (GLUCOVANCE) 5-500 MG per tablet Take 2 tablets by mouth 2 (two) times daily.      . iron polysaccharides (NIFEREX) 150 MG capsule Take 150 mg by mouth daily.      Marland Kitchen levothyroxine (SYNTHROID, LEVOTHROID) 75 MCG tablet Take 75 mcg by mouth daily.      Marland Kitchen lisinopril (PRINIVIL,ZESTRIL) 10 MG tablet Take 10 mg by mouth daily.      . metoprolol tartrate (LOPRESSOR) 25 MG tablet Take 1 tablet (25 mg total) by mouth 2 (two) times daily.  180 tablet  1  . nitroGLYCERIN (NITROSTAT) 0.4 MG SL tablet Place 0.4 mg under the  tongue every 5 (five) minutes as needed. For chest pain      . potassium chloride SA (K-DUR,KLOR-CON) 20 MEQ tablet Take 1 tablet (20 mEq total) by mouth daily.  30 tablet  6  . Tamsulosin HCl (FLOMAX) 0.4 MG CAPS Take 0.4 mg by mouth 1 day or 1 dose.      . VOLTAREN 1 % GEL Apply 1 application topically 4 (four) times daily as needed. For pain      . warfarin (COUMADIN) 5 MG tablet Take 0.5 tab (2.5 mg) daily or as directed by Coumadin Clinic  60 tablet  1   No current facility-administered medications for this visit.    Past Medical History  Diagnosis Date  . Mixed hyperlipidemia   . Coronary atherosclerosis of native coronary artery     2 vessel s/p CABG initial surgery in 2004, status post redo coronary bypass grafting x 3 2013  . Type 2 diabetes mellitus   . Aortic stenosis   . Myocardial infarction 1998  . Essential hypertension, benign   . Atrial fibrillation     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 February 2013  . Sleep apnea     wears CPAP nightly  . Pneumonia 2010  . Type 2 diabetes mellitus   . Hypothyroidism   .  H/O hiatal hernia   . Osteoarthritis   . Rotator cuff tear     Right shoulder    Past Surgical History  Procedure Laterality Date  . Coronary artery bypass graft  04/02/2003    Dr. Cornelius Moras - SVG to OM, composite SVG/RIMA to PDA  . Cholecystectomy    . Cardioversion  11/18/2011  . Cardiac catheterization  12/21/2011  . Appendectomy    . Tonsillectomy    . Coronary artery bypass graft  01/12/2012    Procedure: REDO CORONARY ARTERY BYPASS GRAFTING (CABG);  Surgeon: Purcell Nails, MD;  Location: [facility] OR;  Service: Open Heart Surgery;  Laterality: N/A;  Three grafts; Endoscopically harvested left saphenous vein graft and left internal mammary artery  . Aortic valve replacement  01/12/2012    Procedure: AORTIC VALVE REPLACEMENT (AVR);  Surgeon: Purcell Nails, MD;  Location: [facility] OR;  Service: Open Heart Surgery;  Laterality: N/A;  NO NECKLINE ON THE  RIGHT  . Maze  01/12/2012    Procedure: MAZE;  Surgeon: Purcell Nails, MD;  Location: [facility] OR;  Service: Open Heart Surgery;  Laterality: N/A;  NO NECKLINE ON THE RIGHT    Social History [name] reports that he quit smoking about 30 years ago. His smoking use included Cigarettes. He has a 42 pack-year smoking history. He has never used smokeless tobacco. [name] reports that he does not drink alcohol.  Review of Systems Appetite is stable. States that he sweats a lot when he is outdoors. No dizziness or syncope. No regular peripheral edema. Leg cramps. Otherwise negative.  Physical Examination Filed Vitals:   06/28/13 1250  BP: 148/75  Pulse: 67   Filed Weights   06/28/13 1250  Weight: 217 lb 6.4 oz (98.612 kg)    Appears comfortable at rest..  HEENT: Conjunctiva and lids normal, oropharynx clear.  Neck: Supple, no elevated JVP, no thyromegaly.  Lungs: Clear to auscultation, nonlabored breathing at rest.  Thorax: Well-healed sternal incision. No unusual motion. Cardiac: Regular rate and rhythm, no S3, 2/6 systolic murmur with prostatic AV click, no pericardial rub.  Abdomen: Soft, nontender, bowel sounds present, no guarding or rebound.  Extremities: No pitting edema, distal pulses 1-2+.    Problem List and Plan   Coronary atherosclerosis of native coronary artery Symptomatically stable status post redo CABG in February 2013. ECG reviewed and stable. Continue medical therapy and observation.  S/P Maze operation for atrial fibrillation In sinus rhythm with frequent PACs, no longer on amiodarone.  S/P aortic valve replacement Mechanical prosthesis on Coumadin, followed by Dr. Sherril Croon. Stable exam.  HYPERTENSION, BENIGN Blood pressure is mildly elevated. Keep an eye on this trend with Dr. Sherril Croon. No changes made to current regimen.    Jonelle Sidle, M.D., F.A.C.C.

## 2013-06-28 NOTE — Assessment & Plan Note (Signed)
Symptomatically stable status post redo CABG in February 2013. ECG reviewed and stable. Continue medical therapy and observation.

## 2013-06-28 NOTE — Assessment & Plan Note (Signed)
In sinus rhythm with frequent PACs, no longer on amiodarone.

## 2013-12-20 ENCOUNTER — Encounter: Payer: Self-pay | Admitting: Adult Health

## 2013-12-20 ENCOUNTER — Ambulatory Visit (INDEPENDENT_AMBULATORY_CARE_PROVIDER_SITE_OTHER): Payer: BC Managed Care – PPO | Admitting: Adult Health

## 2013-12-20 VITALS — BP 118/70 | HR 55 | Ht 68.5 in | Wt 193.5 lb

## 2013-12-20 DIAGNOSIS — I10 Essential (primary) hypertension: Secondary | ICD-10-CM

## 2013-12-20 DIAGNOSIS — Z8679 Personal history of other diseases of the circulatory system: Secondary | ICD-10-CM

## 2013-12-20 DIAGNOSIS — Z9889 Other specified postprocedural states: Secondary | ICD-10-CM

## 2013-12-20 DIAGNOSIS — I25810 Atherosclerosis of coronary artery bypass graft(s) without angina pectoris: Secondary | ICD-10-CM

## 2013-12-20 DIAGNOSIS — Z951 Presence of aortocoronary bypass graft: Secondary | ICD-10-CM

## 2013-12-20 DIAGNOSIS — Z952 Presence of prosthetic heart valve: Secondary | ICD-10-CM

## 2013-12-20 DIAGNOSIS — Z954 Presence of other heart-valve replacement: Secondary | ICD-10-CM

## 2013-12-20 NOTE — Assessment & Plan Note (Signed)
Good crisp prosthetic valve sound. 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

## 2013-12-20 NOTE — Assessment & Plan Note (Signed)
We will plan Lexiscan Cardiolite, and echocardiogram in March of 2015 for documentation of patient's status and ongoing treatment. He will continue on his current medication regimen to include aspirin statin and ACE inhibitor. We will followup in our office post testing for discussion of test results and copies.

## 2013-12-20 NOTE — Patient Instructions (Addendum)
Your physician recommends that you schedule a follow-up appointment in: post test with Dr Diona BrownerMcDowell  Your physician has requested that you have an echocardiogram. Echocardiography is a painless test that uses sound waves to create images of your heart. It provides your doctor with information about the size and shape of your heart and how well your heart's chambers and valves are working. This procedure takes approximately one hour. There are no restrictions for this procedure.   Your physician has requested that you have a lexiscan myoview. For further information please visit [email or web address]. Please follow instruction sheet, as given.

## 2013-12-20 NOTE — Assessment & Plan Note (Signed)
Remains in normal sinus rhythm. Coumadin is being dosed by primary care physician in [place].

## 2013-12-20 NOTE — Assessment & Plan Note (Signed)
Good control of blood pressure. We will not make any changes in his medications at this time. Weight is stable. He has had recent labs drawn today by his primary care physician.

## 2013-12-20 NOTE — Progress Notes (Signed)
HPI: [name] is a 63 year old patient of [name] in our [place] office, were following for ongoing assessment and management of CAD,S/p CABG 2013, status post Maze procedure in the setting of atrial fibrillation, and status post aortic valve replacement, with history of hypertension. He was last seen by [name] in July of 2014. The patient was stable. No changes are made in his current medication regimen. Remain on Coumadin secondary to mechanical aortic valve prosthesis and is followed by [name] for dosing.   He comes today without any cardiac complaints. He states he has been seen in [name] office approximately 3 weeks ago and was told he had an elevation in his potassium level. The patient was taken off of potassium and taken off of Lasix. A week later the patient was seen at [facility] for recurrence of CHF and decompensation. The patient was given IV Lasix and diuresed, restarted on his Lasix, with the lower dose of potassium to 20 mEq daily. She is feeling much better breathing better without recurrence of fluid retention.    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 The patient states that it needs to be done in March as he is renewing in April and has to see a CDL physician with paperwork.  No Known Allergies  Current Outpatient Prescriptions  Medication Sig Dispense Refill  . amLODipine (NORVASC) 5 MG tablet Take 1 tablet (5 mg total) by mouth daily.  90 tablet  1  . aspirin 81 MG tablet Take 81 mg by mouth daily.      Marland Kitchen. atorvastatin (LIPITOR) 20 MG tablet Take 1 tablet (20 mg total) by mouth daily.  90 tablet  1  . Canagliflozin (INVOKANA) 300 MG TABS Take 300 mg by mouth daily.      . furosemide (LASIX) 40 MG tablet Take 1 tablet (40 mg total) by mouth daily.  90 tablet  1  . glyBURIDE-metformin (GLUCOVANCE) 5-500 MG per tablet Take 2 tablets by mouth 2 (two) times daily.      . iron  polysaccharides (NIFEREX) 150 MG capsule Take 150 mg by mouth daily.      Marland Kitchen. levothyroxine (SYNTHROID, LEVOTHROID) 75 MCG tablet Take 75 mcg by mouth daily.      Marland Kitchen. lisinopril (PRINIVIL,ZESTRIL) 10 MG tablet Take 10 mg by mouth daily.      . metoprolol tartrate (LOPRESSOR) 25 MG tablet Take 1 tablet (25 mg total) by mouth 2 (two) times daily.  180 tablet  1  . nitroGLYCERIN (NITROSTAT) 0.4 MG SL tablet Place 0.4 mg under the tongue every 5 (five) minutes as needed. For chest pain      . potassium chloride SA (K-DUR,KLOR-CON) 20 MEQ tablet Take 20 mEq by mouth daily.      . Tamsulosin HCl (FLOMAX) 0.4 MG CAPS Take 0.4 mg by mouth 1 day or 1 dose.      . VOLTAREN 1 % GEL Apply 1 application topically 4 (four) times daily as needed. For pain      . warfarin (COUMADIN) 5 MG tablet Take 0.5 tab (2.5 mg) daily or as directed by [facility]  60 tablet  1   No current facility-administered medications for this visit.    Past Medical History  Diagnosis Date  . Mixed hyperlipidemia   . Coronary atherosclerosis of native coronary artery     2 vessel s/p CABG initial surgery in 2004, status  post redo coronary bypass grafting x 3 2013  . Type 2 diabetes mellitus   . Aortic stenosis   . Myocardial infarction 1998  . Essential hypertension, benign   . Atrial fibrillation     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 February 2013  . Sleep apnea     wears CPAP nightly  . Pneumonia 2010  . Type 2 diabetes mellitus   . Hypothyroidism   . H/O hiatal hernia   . Osteoarthritis   . Rotator cuff tear     Right shoulder    Past Surgical History  Procedure Laterality Date  . Coronary artery bypass graft  04/02/2003    Dr. Cornelius Moras - SVG to OM, composite SVG/RIMA to PDA  . Cholecystectomy    . Cardioversion  11/18/2011  . Cardiac catheterization  12/21/2011  . Appendectomy    . Tonsillectomy    . Coronary artery bypass graft  01/12/2012    Procedure: REDO CORONARY ARTERY BYPASS GRAFTING  (CABG);  Surgeon: Purcell Nails, MD;  Location: [facility] OR;  Service: Open Heart Surgery;  Laterality: N/A;  Three grafts; Endoscopically harvested left saphenous vein graft and left internal mammary artery  . Aortic valve replacement  01/12/2012    Procedure: AORTIC VALVE REPLACEMENT (AVR);  Surgeon: Purcell Nails, MD;  Location: [facility] OR;  Service: Open Heart Surgery;  Laterality: N/A;  NO NECKLINE ON THE RIGHT  . Maze  01/12/2012    Procedure: MAZE;  Surgeon: Purcell Nails, MD;  Location: [facility] OR;  Service: Open Heart Surgery;  Laterality: N/A;  NO NECKLINE ON THE RIGHT    RUE:AVWUJW of systems complete and found to be negative unless listed above  PHYSICAL EXAM BP 118/70  Pulse 55  Ht 5' 8.5" (1.74 m)  Wt 193 lb 8 oz (87.771 kg)  BMI 28.99 kg/m2  General: Well developed, well nourished, in no acute distress Head: Eyes PERRLA, No xanthomas.   Normal cephalic and atramatic  Lungs: Clear bilaterally to auscultation and percussion. Heart: HRRR S1 S2, with crisp prosthetic valve click, .  Pulses are 2+ & equal.            No carotid bruit. No JVD.  No abdominal bruits. No femoral bruits. Abdomen: Bowel sounds are positive, abdomen soft and non-tender without masses or                  Hernia's noted. Msk:  Back normal, normal gait. Normal strength and tone for age. Extremities: No clubbing, cyanosis or edema.  DP +1 Neuro: Alert and oriented X 3. Psych:  Good affect, responds appropriately    ASSESSMENT AND PLAN

## 2013-12-20 NOTE — Progress Notes (Deleted)
Name: [name]    DOB: 11/24/1951  Age: 63 y.o.  MR#: [id number]       PCP:  Ignatius SpeckingVYAS,DHRUV B., MD      Insurance: Payor: BLUE [employer]UE [employer] / Plan: BCBS PPO OUT OF STATE / Product Type: *No Product type* /   CC:    Chief Complaint  Patient presents with  . Coronary Artery Disease    VS Filed Vitals:   12/20/13 1614  BP: 118/70  Pulse: 55  Height: 5' 8.5" (1.74 m)  Weight: 193 lb 8 oz (87.771 kg)    Weights Current Weight  12/20/13 193 lb 8 oz (87.771 kg)  06/28/13 217 lb 6.4 oz (98.612 kg)  12/24/12 209 lb (94.802 kg)    Blood Pressure  BP Readings from Last 3 Encounters:  12/20/13 118/70  06/28/13 148/75  12/24/12 110/72     Admit date:  (Not on file) Last encounter with RMR:  Visit date not found   Allergy Review of patient's allergies indicates no known allergies.  Current Outpatient Prescriptions  Medication Sig Dispense Refill  . amLODipine (NORVASC) 5 MG tablet Take 1 tablet (5 mg total) by mouth daily.  90 tablet  1  . aspirin 81 MG tablet Take 81 mg by mouth daily.      Marland Kitchen. atorvastatin (LIPITOR) 20 MG tablet Take 1 tablet (20 mg total) by mouth daily.  90 tablet  1  . Canagliflozin (INVOKANA) 300 MG TABS Take 300 mg by mouth daily.      . furosemide (LASIX) 40 MG tablet Take 1 tablet (40 mg total) by mouth daily.  90 tablet  1  . glyBURIDE-metformin (GLUCOVANCE) 5-500 MG per tablet Take 2 tablets by mouth 2 (two) times daily.      . iron polysaccharides (NIFEREX) 150 MG capsule Take 150 mg by mouth daily.      Marland Kitchen. levothyroxine (SYNTHROID, LEVOTHROID) 75 MCG tablet Take 75 mcg by mouth daily.      Marland Kitchen. lisinopril (PRINIVIL,ZESTRIL) 10 MG tablet Take 10 mg by mouth daily.      . metoprolol tartrate (LOPRESSOR) 25 MG tablet Take 1 tablet (25 mg total) by mouth 2 (two) times daily.  180 tablet  1  . nitroGLYCERIN (NITROSTAT) 0.4 MG SL tablet Place 0.4 mg under the tongue every 5 (five) minutes as needed. For chest pain      . potassium chloride SA  (K-DUR,KLOR-CON) 20 MEQ tablet Take 20 mEq by mouth daily.      . Tamsulosin HCl (FLOMAX) 0.4 MG CAPS Take 0.4 mg by mouth 1 day or 1 dose.      . VOLTAREN 1 % GEL Apply 1 application topically 4 (four) times daily as needed. For pain      . warfarin (COUMADIN) 5 MG tablet Take 0.5 tab (2.5 mg) daily or as directed by [facility]  60 tablet  1   No current facility-administered medications for this visit.    Discontinued Meds:    Medications Discontinued During This Encounter  Medication Reason  . TOPROL XL 25 MG 24 hr tablet Error  . potassium chloride SA (K-DUR,KLOR-CON) 20 MEQ tablet     Patient Active Problem List   Diagnosis Date Noted  . S/P aortic valve replacement 01/12/2012  . 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  . S/P Maze operation for atrial fibrillation 01/12/2012  . Coronary atherosclerosis of native coronary artery 01/09/2012  . Aortic stenosis 12/30/2011  . Encounter for long-term (current) use of anticoagulants  10/21/2011  . HYPERLIPIDEMIA 02/11/2011  . OBSTRUCTIVE SLEEP APNEA 02/11/2011  . HYPERTENSION, BENIGN 02/11/2011  . S/P CABG x 2 04/02/2003    LABS    Component Value Date/Time   NA 133* 01/23/2012 0629   NA 131* 01/22/2012 0544   NA 134* 01/21/2012 0928   K 4.5 01/23/2012 0629   K 5.0 01/22/2012 0544   K 4.4 01/21/2012 0928   CL 98 01/23/2012 0629   CL 95* 01/22/2012 0544   CL 97 01/21/2012 0928   CO2 26 01/23/2012 0629   CO2 28 01/22/2012 0544   CO2 27 01/21/2012 0928   GLUCOSE 171* 01/23/2012 0629   GLUCOSE 152* 01/22/2012 0544   GLUCOSE 206* 01/21/2012 0928   BUN 21 01/23/2012 0629   BUN 21 01/22/2012 0544   BUN 20 01/21/2012 0928   CREATININE 1.05 01/23/2012 0629   CREATININE 1.16 01/22/2012 0544   CREATININE 0.95 01/21/2012 0928   CALCIUM 9.2 01/23/2012 0629   CALCIUM 9.5 01/22/2012 0544   CALCIUM 9.2 01/21/2012 0928   GFRNONAA 75* 01/23/2012 0629   GFRNONAA 66* 01/22/2012 0544   GFRNONAA 88* 01/21/2012 0928   GFRAA 87* 01/23/2012 0629   GFRAA 77* 01/22/2012 0544    GFRAA >90 01/21/2012 0928   CMP     Component Value Date/Time   NA 133* 01/23/2012 0629   K 4.5 01/23/2012 0629   CL 98 01/23/2012 0629   CO2 26 01/23/2012 0629   GLUCOSE 171* 01/23/2012 0629   BUN 21 01/23/2012 0629   CREATININE 1.05 01/23/2012 0629   CALCIUM 9.2 01/23/2012 0629   PROT 7.8 01/09/2012 1324   ALBUMIN 4.1 01/09/2012 1324   AST 30 01/09/2012 1324   ALT 41 01/09/2012 1324   ALKPHOS 77 01/09/2012 1324   BILITOT 0.6 01/09/2012 1324   GFRNONAA 75* 01/23/2012 0629   GFRAA 87* 01/23/2012 0629       Component Value Date/Time   WBC 21.3* 01/23/2012 0629   WBC 23.8* 01/22/2012 0544   WBC 18.3* 01/21/2012 0928   HGB 9.7* 01/23/2012 0629   HGB 9.9* 01/22/2012 0544   HGB 9.6* 01/21/2012 0928   HCT 29.9* 01/23/2012 0629   HCT 30.0* 01/22/2012 0544   HCT 29.6* 01/21/2012 0928   MCV 93.1 01/23/2012 0629   MCV 92.6 01/22/2012 0544   MCV 92.5 01/21/2012 0928    Lipid Panel  No results found for this basename: chol, trig, hdl, cholhdl, vldl, ldlcalc    ABG    Component Value Date/Time   PHART 7.353 01/12/2012 2257   PCO2ART 38.7 01/12/2012 2257   PO2ART 64.0* 01/12/2012 2257   HCO3 21.4 01/12/2012 2257   TCO2 24 01/13/2012 1716   ACIDBASEDEF 4.0* 01/12/2012 2257   O2SAT 91.0 01/12/2012 2257     No results found for this basename: TSH   BNP (last 3 results) No results found for this basename: PROBNP,  in the last 8760 hours Cardiac Panel (last 3 results) No results found for this basename: CKTOTAL, CKMB, TROPONINI, RELINDX,  in the last 72 hours  Iron/TIBC/Ferritin No results found for this basename: iron, tibc, ferritin     EKG Orders placed in visit on 06/28/13  . EKG 12-LEAD     Prior Assessment and Plan Problem List as of 12/20/2013   HYPERLIPIDEMIA   Last Assessment & Plan   10/14/2011 Office Visit Written 10/14/2011  4:30 PM by Jonelle Sidle, MD     Continues on Lipitor.    OBSTRUCTIVE SLEEP APNEA  Last Assessment & Plan   12/16/2011 Office Visit Written 12/16/2011 11:58 AM by Jonelle Sidle, MD     Reports compliance with CPAP.    HYPERTENSION, BENIGN   Last Assessment & Plan   06/28/2013 Office Visit Written 06/28/2013  1:25 PM by Jonelle Sidle, MD     Blood pressure is mildly elevated. Keep an eye on this trend with Dr. Sherril Croon. No changes made to current regimen.    Encounter for long-term (current) use of anticoagulants   S/P CABG x 2   Aortic stenosis   Last Assessment & Plan   03/28/2012 Office Visit Written 03/28/2012  3:16 PM by Jonelle Sidle, MD     Status post AVR as outlined.    Coronary atherosclerosis of native coronary artery   Last Assessment & Plan   06/28/2013 Office Visit Written 06/28/2013  1:23 PM by Jonelle Sidle, MD     Symptomatically stable status post redo CABG in February 2013. ECG reviewed and stable. Continue medical therapy and observation.    S/P aortic valve replacement   Last Assessment & Plan   06/28/2013 Office Visit Written 06/28/2013  1:24 PM by Jonelle Sidle, MD     Mechanical prosthesis on Coumadin, followed by Dr. Sherril Croon. Stable exam.    S/P CABG x 3   S/P Maze operation for atrial fibrillation   Last Assessment & Plan   06/28/2013 Office Visit Written 06/28/2013  1:24 PM by Jonelle Sidle, MD     In sinus rhythm with frequent PACs, no longer on amiodarone.        Imaging: No results found.

## 2013-12-31 ENCOUNTER — Encounter (HOSPITAL_COMMUNITY): Payer: BC Managed Care – PPO

## 2014-02-07 ENCOUNTER — Telehealth: Payer: Self-pay | Admitting: *Deleted

## 2014-02-07 ENCOUNTER — Encounter: Payer: Self-pay | Admitting: *Deleted

## 2014-02-07 NOTE — Telephone Encounter (Signed)
Called pt and told him this nurse will send instructions in the mail. Pt agreed.

## 2014-02-07 NOTE — Telephone Encounter (Signed)
OK 

## 2014-02-07 NOTE — Telephone Encounter (Signed)
Patient informed that stress test is scheduled on 02/28/14. Patient said he was informed that he would be contacted with instructions for stress test.

## 2014-02-28 ENCOUNTER — Encounter (HOSPITAL_COMMUNITY)
Admission: RE | Admit: 2014-02-28 | Discharge: 2014-02-28 | Disposition: A | Discharge status: Home / Self Care | Payer: BC Managed Care – PPO | Source: Ambulatory Visit | Attending: Adult Health | Admitting: Adult Health

## 2014-02-28 ENCOUNTER — Encounter (HOSPITAL_COMMUNITY): Payer: Self-pay

## 2014-02-28 DIAGNOSIS — Z954 Presence of other heart-valve replacement: Secondary | ICD-10-CM | POA: Insufficient documentation

## 2014-02-28 DIAGNOSIS — I25810 Atherosclerosis of coronary artery bypass graft(s) without angina pectoris: Secondary | ICD-10-CM

## 2014-02-28 DIAGNOSIS — I2510 Atherosclerotic heart disease of native coronary artery without angina pectoris: Secondary | ICD-10-CM | POA: Insufficient documentation

## 2014-02-28 DIAGNOSIS — Z951 Presence of aortocoronary bypass graft: Secondary | ICD-10-CM | POA: Insufficient documentation

## 2014-02-28 DIAGNOSIS — I4891 Unspecified atrial fibrillation: Secondary | ICD-10-CM | POA: Insufficient documentation

## 2014-02-28 MED ORDER — TECHNETIUM TC 99M SESTAMIBI GENERIC - CARDIOLITE
10.0000 | Freq: Once | INTRAVENOUS | Status: AC | PRN
  Administered 2014-02-28: 10 via INTRAVENOUS

## 2014-02-28 MED ORDER — SODIUM CHLORIDE 0.9 % IJ SOLN
INTRAMUSCULAR | Status: AC
  Administered 2014-02-28: 10 mL via INTRAVENOUS
  Filled 2014-02-28: qty 10

## 2014-02-28 MED ORDER — TECHNETIUM TC 99M SESTAMIBI - CARDIOLITE
30.0000 | Freq: Once | INTRAVENOUS | Status: AC | PRN
  Administered 2014-02-28: 30 via INTRAVENOUS

## 2014-02-28 MED ORDER — REGADENOSON 0.4 MG/5ML IV SOLN
INTRAVENOUS | Status: AC
  Administered 2014-02-28: 0.4 mg via INTRAVENOUS
  Filled 2014-02-28: qty 5

## 2014-02-28 NOTE — Progress Notes (Signed)
Stress Lab Nurses Notes - [facility]  Hulan SaasDoyle W Rudman 02/28/2014 Reason for doing test: CAD and Physical Type of test: Marlane HatcherLexiscan Cardiolite Nurse performing test: Parke PoissonPhyllis Billingsly, RN Nuclear Medicine Tech: Marcella DubsMiranda Womack Echo Tech: Not Applicable MD performing test: Konoswaran/K.Lawrence NP Family MD: Sherril CroonVyas Test explained and consent signed: yes IV started: 22g jelco, Saline lock flushed, No redness or edema and Saline lock started in radiology Symptoms: felt "Funny all over" Treatment/Intervention: None Reason test stopped: protocol completed After recovery IV was: Discontinued via X-ray tech and No redness or edema Patient to return to Nuc. Med at : 11:15 Patient discharged: Home Patient's Condition upon discharge was: stable Comments: During test BP 120/66 & HR 74.  Recovery BP 109/60 & HR 60.  Symptoms resolved in recovery. Erskine SpeedBillingsley, Izel Hochberg T

## 2014-03-03 ENCOUNTER — Ambulatory Visit (HOSPITAL_COMMUNITY)
Admission: RE | Admit: 2014-03-03 | Discharge: 2014-03-03 | Disposition: A | Discharge status: Home / Self Care | Payer: BC Managed Care – PPO | Source: Ambulatory Visit | Attending: Adult Health | Admitting: Adult Health

## 2014-03-03 DIAGNOSIS — Z954 Presence of other heart-valve replacement: Secondary | ICD-10-CM | POA: Insufficient documentation

## 2014-03-03 DIAGNOSIS — I2510 Atherosclerotic heart disease of native coronary artery without angina pectoris: Secondary | ICD-10-CM | POA: Insufficient documentation

## 2014-03-03 DIAGNOSIS — I517 Cardiomegaly: Secondary | ICD-10-CM

## 2014-03-03 DIAGNOSIS — E785 Hyperlipidemia, unspecified: Secondary | ICD-10-CM | POA: Insufficient documentation

## 2014-03-03 DIAGNOSIS — E119 Type 2 diabetes mellitus without complications: Secondary | ICD-10-CM | POA: Insufficient documentation

## 2014-03-03 DIAGNOSIS — I4891 Unspecified atrial fibrillation: Secondary | ICD-10-CM | POA: Insufficient documentation

## 2014-03-03 DIAGNOSIS — I10 Essential (primary) hypertension: Secondary | ICD-10-CM | POA: Insufficient documentation

## 2014-03-03 DIAGNOSIS — I25810 Atherosclerosis of coronary artery bypass graft(s) without angina pectoris: Secondary | ICD-10-CM

## 2014-03-03 DIAGNOSIS — Z951 Presence of aortocoronary bypass graft: Secondary | ICD-10-CM | POA: Insufficient documentation

## 2014-03-03 NOTE — Progress Notes (Signed)
*  PRELIMINARY RESULTS* Echocardiogram 2D Echocardiogram has been performed.  Annella Prowell 03/03/2014, 4:21 PM

## 2014-11-13 ENCOUNTER — Encounter (HOSPITAL_COMMUNITY): Payer: Self-pay | Admitting: Cardiovascular Disease

## 2015-12-02 ENCOUNTER — Encounter: Payer: Self-pay | Admitting: Cardiology

## 2015-12-02 ENCOUNTER — Ambulatory Visit (INDEPENDENT_AMBULATORY_CARE_PROVIDER_SITE_OTHER): Payer: Managed Care, Other (non HMO) | Admitting: Cardiology

## 2015-12-02 ENCOUNTER — Encounter: Payer: Self-pay | Admitting: *Deleted

## 2015-12-02 VITALS — BP 112/70 | HR 70 | Ht 68.0 in | Wt 199.0 lb

## 2015-12-02 DIAGNOSIS — I10 Essential (primary) hypertension: Secondary | ICD-10-CM | POA: Diagnosis not present

## 2015-12-02 DIAGNOSIS — Z952 Presence of prosthetic heart valve: Secondary | ICD-10-CM

## 2015-12-02 DIAGNOSIS — E785 Hyperlipidemia, unspecified: Secondary | ICD-10-CM

## 2015-12-02 DIAGNOSIS — I359 Nonrheumatic aortic valve disorder, unspecified: Secondary | ICD-10-CM | POA: Diagnosis not present

## 2015-12-02 DIAGNOSIS — R0609 Other forms of dyspnea: Secondary | ICD-10-CM

## 2015-12-02 DIAGNOSIS — I2510 Atherosclerotic heart disease of native coronary artery without angina pectoris: Secondary | ICD-10-CM | POA: Diagnosis not present

## 2015-12-02 DIAGNOSIS — Z954 Presence of other heart-valve replacement: Secondary | ICD-10-CM

## 2015-12-02 NOTE — Progress Notes (Signed)
Cardiology Office Note  Date: 12/02/2015   ID: [name], DOB 04/12/1951, MRN [id number]  PCP: Ignatius Specking., MD  Primary Cardiologist: Nona Dell, MD   Chief Complaint  Patient presents with  . Coronary Artery Disease  . Status post AVR    History of Present Illness: [name] is a 64 y.o. male last seen by Ms. Lawrence NP in January 2015. He presents to the office for follow-up visit complaining of progressive fatigue over the last 6 months, dyspnea on exertion as well at NYHA 2-3 intermittently. He does not report any angina however. 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 He states that he has been taking his medications regularly.  Cardiac regimen includes aspirin, Norvasc, Lipitor, Lasix, lisinopril, Lopressor, as needed nitroglycerin, and KCl. He also continues on Coumadin, followed by Dr. Sherril Croon.  I reviewed his lab work from September. He reports no bleeding problems and generally has had therapeutic INR levels.  Cardiac structural and ischemic testing from 2015 is outlined below. We discussed these results today.  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  He states he has had trouble with decreased vision related to cataracts, underwent surgery with lens implants. Still not completely back to baseline.  He also reports having an episode of brief  lightheadedness and near syncope that happened suddenly without any other recurrences.  No regular palpitations. No obvious atrial fibrillation documented recently.  Past Medical History  Diagnosis Date  . Mixed hyperlipidemia   . Coronary atherosclerosis of native coronary artery     2 vessel s/p CABG initial surgery in 2004, status post redo coronary bypass grafting x 3 2013  . Type 2 diabetes mellitus (HCC)   . Aortic stenosis   . Myocardial infarction (HCC) 1998  .  Essential hypertension, benign   . Atrial fibrillation (HCC)     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 February 2013  . Sleep apnea     wears CPAP nightly  . Pneumonia 2010  . Type 2 diabetes mellitus (HCC)   . Hypothyroidism   . H/O hiatal hernia   . Osteoarthritis   . Rotator cuff tear     Right shoulder    Past Surgical History  Procedure Laterality Date  . Coronary artery bypass graft  04/02/2003    Dr. Cornelius Moras - SVG to OM, composite SVG/RIMA to PDA  . Cholecystectomy    . Cardioversion  11/18/2011  . Cardiac catheterization  12/21/2011  . Appendectomy    . Tonsillectomy    . Coronary artery bypass graft  01/12/2012    Procedure: REDO CORONARY ARTERY BYPASS GRAFTING (CABG);  Surgeon: Purcell Nails, MD;  Location: [facility] OR;  Service: Open Heart Surgery;  Laterality: N/A;  Three grafts; Endoscopically harvested left saphenous vein graft and left internal mammary artery  . Aortic valve replacement  01/12/2012    Procedure: AORTIC VALVE REPLACEMENT (AVR);  Surgeon: Purcell Nails, MD;  Location: [facility] OR;  Service: Open Heart Surgery;  Laterality: N/A;  NO NECKLINE ON THE RIGHT  . Maze  01/12/2012    Procedure: MAZE;  Surgeon: Purcell Nails, MD;  Location: [facility] OR;  Service: Open Heart Surgery;  Laterality: N/A;  NO NECKLINE ON THE RIGHT  . Left and right heart catheterization with coronary angiogram N/A 12/21/2011    Procedure: LEFT AND RIGHT HEART CATHETERIZATION  WITH CORONARY ANGIOGRAM;  Surgeon: Iran OuchMuhammad A Arida, MD;  [facility];  Service: Cardiovascular;  Laterality: N/A;    Current Outpatient Prescriptions  Medication Sig Dispense Refill  . amLODipine (NORVASC) 5 MG tablet Take 1 tablet (5 mg total) by mouth daily. 90 tablet 1  . aspirin 81 MG tablet Take 81 mg by mouth daily.    Marland Kitchen. atorvastatin (LIPITOR) 20 MG tablet Take 1 tablet (20 mg total) by mouth daily. 90 tablet 1  . Canagliflozin (INVOKANA) 300 MG TABS Take 300 mg by mouth daily.    . furosemide  (LASIX) 40 MG tablet Take 1 tablet (40 mg total) by mouth daily. 90 tablet 1  . glyBURIDE-metformin (GLUCOVANCE) 5-500 MG per tablet Take 2 tablets by mouth 2 (two) times daily.    . iron polysaccharides (NIFEREX) 150 MG capsule Take 150 mg by mouth daily.    Marland Kitchen. levothyroxine (SYNTHROID, LEVOTHROID) 75 MCG tablet Take 75 mcg by mouth daily.    Marland Kitchen. lisinopril (PRINIVIL,ZESTRIL) 10 MG tablet Take 10 mg by mouth daily.    . metoprolol tartrate (LOPRESSOR) 25 MG tablet Take 1 tablet (25 mg total) by mouth 2 (two) times daily. 180 tablet 1  . nitroGLYCERIN (NITROSTAT) 0.4 MG SL tablet Place 0.4 mg under the tongue every 5 (five) minutes as needed. For chest pain    . Tamsulosin HCl (FLOMAX) 0.4 MG CAPS Take 0.4 mg by mouth 1 day or 1 dose.    . VOLTAREN 1 % GEL Apply 1 application topically 4 (four) times daily as needed. For pain    . warfarin (COUMADIN) 5 MG tablet Take 0.5 tab (2.5 mg) daily or as directed by [facility] 60 tablet 1   No current facility-administered medications for this visit.   Allergies:  Review of patient's allergies indicates no known allergies.   Social History: The patient  reports that he quit smoking about 33 years ago. His smoking use included Cigarettes. He has a 42 pack-year smoking history. He has never used smokeless tobacco. He reports that he does not drink alcohol or use illicit drugs.   ROS:  Please see the history of present illness. Otherwise, complete review of systems is positive for arthritic pains.  All other systems are reviewed and negative.   Physical Exam: VS:  BP 112/70 mmHg  Pulse 70  Ht 5\' 8"  (1.727 m)  Wt 199 lb (90.266 kg)  BMI 30.26 kg/m2  SpO2 98%, BMI Body mass index is 30.26 kg/(m^2).  Wt Readings from Last 3 Encounters:  12/02/15 199 lb (90.266 kg)  12/20/13 193 lb 8 oz (87.771 kg)  06/28/13 217 lb 6.4 oz (98.612 kg)    Overweight male, appears comfortable at rest..  HEENT: Conjunctiva and lids normal, oropharynx clear.  Neck:  Supple, no elevated JVP, no thyromegaly.  Lungs: Clear to auscultation, nonlabored breathing at rest.  Thorax: Well-healed sternal incision. Cardiac: Regular rate and rhythm, no S3, 2/6 systolic murmur with prostatic AV click, no pericardial rub.  Abdomen: Soft, nontender, bowel sounds present, no guarding or rebound.  Extremities: No pitting edema, distal pulses 1-2+. Skin: Warm and dry. Musculoskeletal: No kyphosis. Neuropsychiatric: Alert and oriented 3, affect appropriate.  ECG: Tracing from 05/29/2013 showed sinus rhythm with PACs, right bundle branch block, old inferolateral infarct pattern..  Recent Labwork:  September 2016: Hemoglobin 14.3, platelets 233, BUN 42, creatinine 1.6, potassium 5.2, AST 22, ALT 28, INR 2.6  Other Studies Reviewed Today:  Echocardiogram 03/03/2014: Study Conclusions  - Left  ventricle: The cavity size was normal. Wall thickness was increased in a pattern of mild LVH. Systolic function was vigorous. The estimated ejection fraction was in the range of 65% to 70%. Doppler parameters are consistent with abnormal left ventricular relaxation (grade 1 diastolic dysfunction). - Aortic valve: AV prosthesis is difficult to see but appears to move well.Peak nad mean gradients through the valve are 36 and 19 mm Hg respectively. - Left atrium: The atrium was mildly dilated. - Right atrium: The atrium was mildly dilated.  Lexiscan Cardiolite 02/28/2014: FINDINGS: Baseline ECG shows sinus rhythm with old inferior infarct pattern, right bundle branch block, diffuse repolarization abnormalities. Lexiscan bolus was given in standard fashion. Heart rate increased from 54 beats per min up to 76 beats per min, and blood pressure increased from 110/61 up to 120/66. Patient tolerated infusion well. There were no diagnostic ST segment abnormalities over baseline, and no arrhythmias were noted.  Analysis of the raw perfusion data finds diaphragmatic  attenuation.  Tomographic views were obtained using the short axis, vertical long axis, and horizontal long axis planes. There is a mild to moderate sized, moderate to severe intensity, mid to basal inferolateral defect that is fixed. This is actually more prominent at rest than stress. Although diaphragmatic attenuation may be contributing, also consistent with scar. There is no clear evidence of ischemia.  Gated imaging reveals an EDV of 127, ESV of 52, TID ratio of 1.10, and LVEF of 59% with inferior/inferoseptal hypokinesis.  IMPRESSION: Abnormal but low risk Lexiscan Cardiolite as outlined. There were no diagnostic ST segment abnormalities over baseline. Perfusion imaging shows probable scar affecting the mid to basal inferolateral wall, although no significant ischemic territories were noted. LVEF is 59% with normal volumes and inferoseptal/inferior hypokinesis.  Assessment and Plan:  1. Reported sense of progressive fatigue and dyspnea on exertion over the last 6 months. He does not specifically endorse angina on medical therapy. We will plan to follow-up with a Lexiscan Cardiolite to reassess ischemic burden in comparison to study from 2015. No specific changes to medical therapy for now.  2. History of aortic stenosis status post mechanical AVR, continues on Coumadin. Follow-up echocardiogram will be obtained for reassessment of cardiac structure and function.  3. History of atrial fibrillation, presently maintaining sinus rhythm. He continues on Coumadin with concurrent mechanical AVR. Previously on amiodarone, although no antiarrhythmic therapy at this time.  4. Essential hypertension, blood pressure control is good today.  5. Hyperlipidemia, on Lipitor. He continues to follow with Dr. Sherril Croon.  Current medicines were reviewed with the patient today.   Orders Placed This Encounter  Procedures  . NM Myocar Multi W/Spect W/Wall Motion / EF  . Myocardial Perfusion Imaging   . EKG 12-Lead  . ECHOCARDIOGRAM COMPLETE    Disposition: FU with me in 6 months.   Signed, Jonelle Sidle, MD, [facility] 12/02/2015 11:07 AM    [facility] at [facility] [address] [place], [place], [place] [address] Phone: [phone]; Fax: [phone]

## 2015-12-02 NOTE — Patient Instructions (Signed)
Your physician has requested that you have an echocardiogram. Echocardiography is a painless test that uses sound waves to create images of your heart. It provides your doctor with information about the size and shape of your heart and how well your heart's chambers and valves are working. This procedure takes approximately one hour. There are no restrictions for this procedure. Your physician has requested that you have a lexiscan myoview. For further information please visit [email or web address]. Please follow instruction sheet, as given. Office will contact with results via phone or letter.   Continue all current medications. Your physician wants you to follow up in: 6 months.  You will receive a reminder letter in the mail one-two months in advance.  If you don't receive a letter, please call our office to schedule the follow up appointment    

## 2015-12-15 ENCOUNTER — Encounter (HOSPITAL_COMMUNITY): Payer: Managed Care, Other (non HMO)

## 2015-12-15 ENCOUNTER — Other Ambulatory Visit (HOSPITAL_COMMUNITY): Payer: No Typology Code available for payment source

## 2015-12-15 ENCOUNTER — Inpatient Hospital Stay (HOSPITAL_COMMUNITY): Admission: RE | Admit: 2015-12-15 | Payer: No Typology Code available for payment source | Source: Ambulatory Visit

## 2015-12-30 DIAGNOSIS — I509 Heart failure, unspecified: Secondary | ICD-10-CM | POA: Diagnosis not present

## 2015-12-30 DIAGNOSIS — I4891 Unspecified atrial fibrillation: Secondary | ICD-10-CM | POA: Diagnosis not present

## 2015-12-30 DIAGNOSIS — M109 Gout, unspecified: Secondary | ICD-10-CM | POA: Diagnosis not present

## 2016-02-17 DIAGNOSIS — Z1389 Encounter for screening for other disorder: Secondary | ICD-10-CM | POA: Diagnosis not present

## 2016-02-17 DIAGNOSIS — Z79899 Other long term (current) drug therapy: Secondary | ICD-10-CM | POA: Diagnosis not present

## 2016-02-17 DIAGNOSIS — Z299 Encounter for prophylactic measures, unspecified: Secondary | ICD-10-CM | POA: Diagnosis not present

## 2016-02-17 DIAGNOSIS — Z0000 Encounter for general adult medical examination without abnormal findings: Secondary | ICD-10-CM | POA: Diagnosis not present

## 2016-02-17 DIAGNOSIS — I509 Heart failure, unspecified: Secondary | ICD-10-CM | POA: Diagnosis not present

## 2016-02-17 DIAGNOSIS — I4891 Unspecified atrial fibrillation: Secondary | ICD-10-CM | POA: Diagnosis not present

## 2016-02-17 DIAGNOSIS — Z1211 Encounter for screening for malignant neoplasm of colon: Secondary | ICD-10-CM | POA: Diagnosis not present

## 2016-02-17 DIAGNOSIS — Z7189 Other specified counseling: Secondary | ICD-10-CM | POA: Diagnosis not present

## 2016-02-17 DIAGNOSIS — E7800 Pure hypercholesterolemia, unspecified: Secondary | ICD-10-CM | POA: Diagnosis not present

## 2016-02-17 DIAGNOSIS — I10 Essential (primary) hypertension: Secondary | ICD-10-CM | POA: Diagnosis not present

## 2016-02-17 DIAGNOSIS — R5383 Other fatigue: Secondary | ICD-10-CM | POA: Diagnosis not present

## 2016-02-17 DIAGNOSIS — Z6829 Body mass index (BMI) 29.0-29.9, adult: Secondary | ICD-10-CM | POA: Diagnosis not present

## 2016-02-17 DIAGNOSIS — Z125 Encounter for screening for malignant neoplasm of prostate: Secondary | ICD-10-CM | POA: Diagnosis not present

## 2016-02-23 DIAGNOSIS — Z789 Other specified health status: Secondary | ICD-10-CM | POA: Diagnosis not present

## 2016-02-23 DIAGNOSIS — I4891 Unspecified atrial fibrillation: Secondary | ICD-10-CM | POA: Diagnosis not present

## 2016-02-23 DIAGNOSIS — I509 Heart failure, unspecified: Secondary | ICD-10-CM | POA: Diagnosis not present

## 2016-03-02 ENCOUNTER — Other Ambulatory Visit: Payer: Self-pay

## 2016-03-02 ENCOUNTER — Ambulatory Visit (INDEPENDENT_AMBULATORY_CARE_PROVIDER_SITE_OTHER): Payer: Medicare Other

## 2016-03-02 DIAGNOSIS — I359 Nonrheumatic aortic valve disorder, unspecified: Secondary | ICD-10-CM | POA: Diagnosis not present

## 2016-03-03 ENCOUNTER — Telehealth: Payer: Self-pay | Admitting: *Deleted

## 2016-03-03 NOTE — Telephone Encounter (Signed)
-----   Message from Nori Riisatherine A Carlton, RN sent at 03/03/2016 12:03 PM EDT ----- [place]  ----- Message -----    From: Jonelle SidleSamuel G McDowell, MD    Sent: 03/02/2016   5:04 PM      To: Nori Riisatherine A Carlton, RN  Reviewed report. LVEF is normal. Mechanical aortic prosthesis is overall stable, no major change in mean gradient.

## 2016-03-14 ENCOUNTER — Encounter (HOSPITAL_COMMUNITY)
Admission: RE | Admit: 2016-03-14 | Discharge: 2016-03-14 | Disposition: A | Discharge status: Home / Self Care | Payer: Medicare Other | Source: Ambulatory Visit | Attending: Cardiology | Admitting: Cardiology

## 2016-03-14 ENCOUNTER — Encounter (HOSPITAL_COMMUNITY): Payer: Self-pay

## 2016-03-14 ENCOUNTER — Inpatient Hospital Stay (HOSPITAL_COMMUNITY): Admission: RE | Admit: 2016-03-14 | Payer: Medicare Other | Source: Ambulatory Visit

## 2016-03-14 DIAGNOSIS — R931 Abnormal findings on diagnostic imaging of heart and coronary circulation: Secondary | ICD-10-CM | POA: Diagnosis not present

## 2016-03-14 DIAGNOSIS — I2510 Atherosclerotic heart disease of native coronary artery without angina pectoris: Secondary | ICD-10-CM

## 2016-03-14 DIAGNOSIS — R0609 Other forms of dyspnea: Secondary | ICD-10-CM | POA: Diagnosis not present

## 2016-03-14 LAB — NM MYOCAR MULTI W/SPECT W/WALL MOTION / EF
CHL CUP NUCLEAR SDS: 3
CHL CUP NUCLEAR SRS: 10
LHR: 0.36
LV dias vol: 97 mL (ref 62–150)
LVSYSVOL: 36 mL
NUC STRESS TID: 1.01
Peak HR: 76 {beats}/min
Rest HR: 52 {beats}/min
SSS: 13

## 2016-03-14 MED ORDER — TECHNETIUM TC 99M SESTAMIBI GENERIC - CARDIOLITE
10.0000 | Freq: Once | INTRAVENOUS | Status: AC | PRN
  Administered 2016-03-14: 10 via INTRAVENOUS

## 2016-03-14 MED ORDER — REGADENOSON 0.4 MG/5ML IV SOLN
INTRAVENOUS | Status: AC
  Administered 2016-03-14: 0.4 mg via INTRAVENOUS
  Filled 2016-03-14: qty 5

## 2016-03-14 MED ORDER — TECHNETIUM TC 99M SESTAMIBI - CARDIOLITE
30.0000 | Freq: Once | INTRAVENOUS | Status: AC | PRN
  Administered 2016-03-14: 10:00:00 30.1 via INTRAVENOUS

## 2016-03-14 MED ORDER — SODIUM CHLORIDE 0.9% FLUSH
INTRAVENOUS | Status: AC
  Administered 2016-03-14: 10 mL via INTRAVENOUS
  Filled 2016-03-14: qty 10

## 2016-03-14 NOTE — Telephone Encounter (Signed)
Patient informed. 

## 2016-03-15 ENCOUNTER — Telehealth: Payer: Self-pay | Admitting: *Deleted

## 2016-03-15 NOTE — Telephone Encounter (Signed)
-----   Message from Jonelle SidleSamuel G McDowell, MD sent at 03/14/2016  2:03 PM EDT ----- Reviewed report and images. There is evidence of inferolateral scar with mild to moderate peri-infarct ischemia and normal LVEF. Ischemia more evident on this study compared to prior. If he is clinically stable without progressive angina, we can continue medical therapy and observation. If exertional symptoms are worsening, we should see him back in the office to discuss other options.

## 2016-03-15 NOTE — Telephone Encounter (Signed)
Patient informed and verbalized understanding of plan. 

## 2016-03-22 DIAGNOSIS — I509 Heart failure, unspecified: Secondary | ICD-10-CM | POA: Diagnosis not present

## 2016-03-22 DIAGNOSIS — I4891 Unspecified atrial fibrillation: Secondary | ICD-10-CM | POA: Diagnosis not present

## 2016-03-22 DIAGNOSIS — I10 Essential (primary) hypertension: Secondary | ICD-10-CM | POA: Diagnosis not present

## 2016-04-19 DIAGNOSIS — M109 Gout, unspecified: Secondary | ICD-10-CM | POA: Diagnosis not present

## 2016-04-19 DIAGNOSIS — I4891 Unspecified atrial fibrillation: Secondary | ICD-10-CM | POA: Diagnosis not present

## 2016-04-21 DIAGNOSIS — I2510 Atherosclerotic heart disease of native coronary artery without angina pectoris: Secondary | ICD-10-CM | POA: Diagnosis not present

## 2016-04-21 DIAGNOSIS — E119 Type 2 diabetes mellitus without complications: Secondary | ICD-10-CM | POA: Diagnosis not present

## 2016-04-21 DIAGNOSIS — I4891 Unspecified atrial fibrillation: Secondary | ICD-10-CM | POA: Diagnosis not present

## 2016-04-21 DIAGNOSIS — I10 Essential (primary) hypertension: Secondary | ICD-10-CM | POA: Diagnosis not present

## 2016-05-06 DIAGNOSIS — I25810 Atherosclerosis of coronary artery bypass graft(s) without angina pectoris: Secondary | ICD-10-CM | POA: Diagnosis not present

## 2016-05-06 DIAGNOSIS — E1165 Type 2 diabetes mellitus with hyperglycemia: Secondary | ICD-10-CM | POA: Diagnosis not present

## 2016-05-06 DIAGNOSIS — I509 Heart failure, unspecified: Secondary | ICD-10-CM | POA: Diagnosis not present

## 2016-05-06 DIAGNOSIS — I4891 Unspecified atrial fibrillation: Secondary | ICD-10-CM | POA: Diagnosis not present

## 2016-05-06 DIAGNOSIS — I10 Essential (primary) hypertension: Secondary | ICD-10-CM | POA: Diagnosis not present

## 2016-05-30 DIAGNOSIS — I4891 Unspecified atrial fibrillation: Secondary | ICD-10-CM | POA: Diagnosis not present

## 2016-05-30 DIAGNOSIS — Z299 Encounter for prophylactic measures, unspecified: Secondary | ICD-10-CM | POA: Diagnosis not present

## 2016-05-30 DIAGNOSIS — I509 Heart failure, unspecified: Secondary | ICD-10-CM | POA: Diagnosis not present

## 2016-06-01 DIAGNOSIS — I2510 Atherosclerotic heart disease of native coronary artery without angina pectoris: Secondary | ICD-10-CM | POA: Diagnosis not present

## 2016-06-01 DIAGNOSIS — I4891 Unspecified atrial fibrillation: Secondary | ICD-10-CM | POA: Diagnosis not present

## 2016-06-01 DIAGNOSIS — I10 Essential (primary) hypertension: Secondary | ICD-10-CM | POA: Diagnosis not present

## 2016-06-01 DIAGNOSIS — E119 Type 2 diabetes mellitus without complications: Secondary | ICD-10-CM | POA: Diagnosis not present

## 2016-06-06 ENCOUNTER — Ambulatory Visit (INDEPENDENT_AMBULATORY_CARE_PROVIDER_SITE_OTHER): Payer: Medicare Other | Admitting: Cardiology

## 2016-06-06 ENCOUNTER — Encounter: Payer: Self-pay | Admitting: Cardiology

## 2016-06-06 VITALS — BP 110/58 | HR 53 | Ht 68.0 in | Wt 200.0 lb

## 2016-06-06 DIAGNOSIS — Z952 Presence of prosthetic heart valve: Secondary | ICD-10-CM

## 2016-06-06 DIAGNOSIS — I10 Essential (primary) hypertension: Secondary | ICD-10-CM | POA: Diagnosis not present

## 2016-06-06 DIAGNOSIS — I480 Paroxysmal atrial fibrillation: Secondary | ICD-10-CM

## 2016-06-06 DIAGNOSIS — Z954 Presence of other heart-valve replacement: Secondary | ICD-10-CM | POA: Diagnosis not present

## 2016-06-06 DIAGNOSIS — I2510 Atherosclerotic heart disease of native coronary artery without angina pectoris: Secondary | ICD-10-CM | POA: Diagnosis not present

## 2016-06-06 NOTE — Progress Notes (Signed)
Cardiology Office Note  Date: 06/06/2016   ID: [name], DOB 1950-12-14, MRN [id number]  PCP: Ignatius Specking, MD  Primary Cardiologist: Nona Dell, MD   Chief Complaint  Patient presents with  . Coronary Artery Disease  . Status post AVR  . Atrial Fibrillation    History of Present Illness: [name] is a 65 y.o. male last seen in December 2016. He presents for a routine follow-up visit. Reports no angina symptoms or nitroglycerin use. 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  Follow-up cardiac structural and ischemic testing from earlier this year is outlined below. Mechanical AVR showed adequate function. He had a region of mixed scar and mild peri-infarct ischemia in the inferolateral wall with normal LVEF. We have continue medical therapy.  I reviewed his medications. Cardiac regimen has been stable. He has had some adjustments in his diabetes regimen per PCP. Coumadin is followed by Dr. Sherril Croon. He denies any major bleeding problems.  Past Medical History  Diagnosis Date  . Mixed hyperlipidemia   . Coronary atherosclerosis of native coronary artery     2 vessel s/p CABG initial surgery in 2004, status post redo coronary bypass grafting x 3 2013  . Type 2 diabetes mellitus (HCC)   . Aortic stenosis   . Myocardial infarction (HCC) 1998  . Essential hypertension, benign   . Atrial fibrillation (HCC)     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 February 2013  . Sleep apnea     wears CPAP nightly  . Pneumonia 2010  . Type 2 diabetes mellitus (HCC)   . Hypothyroidism   . H/O hiatal hernia   . Osteoarthritis   . Rotator cuff tear     Right shoulder    Past Surgical History  Procedure Laterality Date  . Coronary artery bypass graft  04/02/2003    Dr. Cornelius Moras - SVG to OM, composite SVG/RIMA to PDA  . Cholecystectomy    . Cardioversion  11/18/2011  . Cardiac catheterization  12/21/2011  . Appendectomy      . Tonsillectomy    . Coronary artery bypass graft  01/12/2012    Procedure: REDO CORONARY ARTERY BYPASS GRAFTING (CABG);  Surgeon: Purcell Nails, MD;  Location[facility] OR;  Service: Open Heart Surgery;  Laterality: N/A;  Three grafts; Endoscopically harvested left saphenous vein graft and left internal mammary artery  . Aortic valve replacement  01/12/2012    Procedure: AORTIC VALVE REPLACEMENT (AVR);  Surgeon: Purcell Nails, MD;  Location: [facility] OR;  Service: Open Heart Surgery;  Laterality: N/A;  NO NECKLINE ON THE RIGHT  . Maze  01/12/2012    Procedure: MAZE;  Surgeon: Purcell Nails, MD;  Location: [facility] OR;  Service: Open Heart Surgery;  Laterality: N/A;  NO NECKLINE ON THE RIGHT  . Left and right heart catheterization with coronary angiogram N/A 12/21/2011    Procedure: LEFT AND RIGHT HEART CATHETERIZATION WITH CORONARY ANGIOGRAM;  Surgeon: Iran Ouch, MD;  Location: [facility] CATH LAB;  Service: Cardiovascular;  Laterality: N/A;    Current Outpatient Prescriptions  Medication Sig Dispense Refill  . allopurinol (ZYLOPRIM) 300 MG tablet Take 300 mg by mouth daily.    Marland Kitchen amLODipine (NORVASC) 5 MG tablet Take 1 tablet (5 mg total) by mouth daily. 90 tablet 1  . aspirin 81 MG tablet Take 81 mg by mouth daily.    Marland Kitchen atenolol (TENORMIN) 25 MG tablet Take by mouth daily.    Marland Kitchen  atorvastatin (LIPITOR) 20 MG tablet Take 1 tablet (20 mg total) by mouth daily. 90 tablet 1  . Canagliflozin (INVOKANA) 300 MG TABS Take 300 mg by mouth daily.    . finasteride (PROSCAR) 5 MG tablet Take 5 mg by mouth daily.    . furosemide (LASIX) 40 MG tablet Take 1 tablet (40 mg total) by mouth daily. 90 tablet 1  . glipiZIDE (GLUCOTROL) 5 MG tablet Take 5 mg by mouth daily before breakfast.    . glyBURIDE-metformin (GLUCOVANCE) 5-500 MG per tablet Take 2 tablets by mouth 2 (two) times daily.    . iron polysaccharides (NIFEREX) 150 MG capsule Take 150 mg by mouth daily.    Marland Kitchen. levothyroxine (SYNTHROID, LEVOTHROID) 75 MCG tablet Take  75 mcg by mouth daily.    Marland Kitchen. lisinopril (PRINIVIL,ZESTRIL) 10 MG tablet Take 10 mg by mouth daily.    . nitroGLYCERIN (NITROSTAT) 0.4 MG SL tablet Place 0.4 mg under the tongue every 5 (five) minutes as needed. For chest pain    . Tamsulosin HCl (FLOMAX) 0.4 MG CAPS Take 0.4 mg by mouth 1 day or 1 dose.    . VOLTAREN 1 % GEL Apply 1 application topically 4 (four) times daily as needed. For pain    . warfarin (COUMADIN) 5 MG tablet Take 0.5 tab (2.5 mg) daily or as directed by [facility] 60 tablet 1   No current facility-administered medications for this visit.   Allergies:  Review of patient's allergies indicates no known allergies.   Social History: The patient  reports that he quit smoking about 33 years ago. His smoking use included Cigarettes. He has a 42 pack-year smoking history. He has never used smokeless tobacco. He reports that he does not drink alcohol or use illicit drugs.   ROS:  Please see the history of present illness. Otherwise, complete review of systems is positive for intermittent gout flares.  All other systems are reviewed and negative.   Physical Exam: VS:  BP 110/58 mmHg  Pulse 53  Ht 5\' 8"  (1.727 m)  Wt 200 lb (90.719 kg)  BMI 30.42 kg/m2  SpO2 98%, BMI Body mass index is 30.42 kg/(m^2).  Wt Readings from Last 3 Encounters:  06/06/16 200 lb (90.719 kg)  12/02/15 199 lb (90.266 kg)  12/20/13 193 lb 8 oz (87.771 kg)    Overweight male, appears comfortable at rest..  HEENT: Conjunctiva and lids normal, oropharynx clear.  Neck: Supple, no elevated JVP, no thyromegaly.  Lungs: Clear to auscultation, nonlabored breathing at rest.  Thorax: Well-healed sternal incision. Cardiac: Regular rate and rhythm, no S3, 2/6 systolic murmur with prostatic AV click, no pericardial rub.  Abdomen: Soft, nontender, bowel sounds present, no guarding or rebound.  Extremities: No pitting edema, distal pulses 1-2+. Skin: Warm and dry. Musculoskeletal: No  kyphosis. Neuropsychiatric: Alert and oriented 3, affect appropriate.  ECG: I personally reviewed the tracing from 12/02/2015 which showed sinus rhythm with right bundle branch block and old inferolateral infarct pattern.  Recent Labwork:  March 2017: BUN 37, creatinine 1.5, potassium 4.6, AST 18, ALT 27 TSH 3.9, hemoglobin 15.5, platelets 262, cholesterol 157, triglycerides 204, HDL 32, LDL 84  Other Studies Reviewed Today:  Echocardiogram 03/02/2016: Study Conclusions  - Left ventricle: The cavity size was normal. Wall thickness was  increased in a pattern of mild LVH. Systolic function was  vigorous. The estimated ejection fraction was in the range of 65%  to 70%. Diastolic function is abnormal, indeterminate grade. Wall  motion was  normal; there were no regional wall motion  abnormalities. - Aortic valve: There is a 23 mm [employer]  mechanical prosthetic heart valve in the AV position. Moderately  calcified annulus. Reported normals for valve peak gradient 24  +/- 8 mmHg and mean gradient 13 +/- 4 mmHg. Measured gradients  are on the high end of normal to mildly elevated. Note this is in  the setting of hyperdynamic LV systolic function. There was no  significant regurgitation. Mean gradient (S): 17 mm Hg. Peak  gradient (S): 36 mm Hg. - Left atrium: The atrium was mildly dilated. - Technically adequate study.  Lexiscan Cardiolite 03/14/2016:  There was no ST segment deviation noted during stress.  Findings consistent with prior inferolateral myocardial infarction with mild peri-infarct ischemia.  This is a low risk study. Overall small area of myocardium currently at jeopardy.  The left ventricular ejection fraction is normal (55-65%).  Assessment and Plan:  1. Multivessel CAD status post redo CABG in 2013. Symptomatically stable in terms of angina. Cardiolite study from earlier this year showed an area of mixed scar and ischemia in the  inferolateral wall. We will continue with medical therapy and observation for now.  2. Paroxysmal atrial fibrillation, asymptomatic terms of palpitations. He is on Coumadin.  3. Essential hypertension, blood pressure is well controlled today. No changes made in regimen.  4. History of aortic stenosis status post mechanical AVR at the time of redo CABG in 2013. Valve function stable by recent echocardiogram and LVEF normal range.  Current medicines were reviewed with the patient today.  Disposition: Follow-up with me in 6 months.  Signed, Jonelle SidleSamuel G. Isiah Scheel, MD, [facility] 06/06/2016 9:19 AM    [facility] [address], [place], [place] [address] Phone: [phone]; Fax: [phone]

## 2016-06-06 NOTE — Patient Instructions (Signed)
Medication Instructions:  Continue all current medications.  Labwork: NONE  Testing/Procedures: NONE  Follow-Up: Your physician wants you to follow up in: 6 months.  You will receive a reminder letter in the mail one-two months in advance.  If you don't receive a letter, please call our office to schedule the follow up appointment    If you need a refill on your cardiac medications before your next appointment, please call your pharmacy.  

## 2016-06-27 DIAGNOSIS — I4891 Unspecified atrial fibrillation: Secondary | ICD-10-CM | POA: Diagnosis not present

## 2016-06-27 DIAGNOSIS — M545 Low back pain: Secondary | ICD-10-CM | POA: Diagnosis not present

## 2016-06-27 DIAGNOSIS — I509 Heart failure, unspecified: Secondary | ICD-10-CM | POA: Diagnosis not present

## 2016-06-28 DIAGNOSIS — H52209 Unspecified astigmatism, unspecified eye: Secondary | ICD-10-CM | POA: Diagnosis not present

## 2016-06-28 DIAGNOSIS — H26491 Other secondary cataract, right eye: Secondary | ICD-10-CM | POA: Diagnosis not present

## 2016-08-01 DIAGNOSIS — I4891 Unspecified atrial fibrillation: Secondary | ICD-10-CM | POA: Diagnosis not present

## 2016-08-03 DIAGNOSIS — H26491 Other secondary cataract, right eye: Secondary | ICD-10-CM | POA: Diagnosis not present

## 2016-08-15 DIAGNOSIS — R21 Rash and other nonspecific skin eruption: Secondary | ICD-10-CM | POA: Diagnosis not present

## 2016-08-15 DIAGNOSIS — I10 Essential (primary) hypertension: Secondary | ICD-10-CM | POA: Diagnosis not present

## 2016-08-15 DIAGNOSIS — Z713 Dietary counseling and surveillance: Secondary | ICD-10-CM | POA: Diagnosis not present

## 2016-08-15 DIAGNOSIS — E1165 Type 2 diabetes mellitus with hyperglycemia: Secondary | ICD-10-CM | POA: Diagnosis not present

## 2016-08-15 DIAGNOSIS — Z6828 Body mass index (BMI) 28.0-28.9, adult: Secondary | ICD-10-CM | POA: Diagnosis not present

## 2016-08-19 DIAGNOSIS — I2510 Atherosclerotic heart disease of native coronary artery without angina pectoris: Secondary | ICD-10-CM | POA: Diagnosis not present

## 2016-08-19 DIAGNOSIS — I4891 Unspecified atrial fibrillation: Secondary | ICD-10-CM | POA: Diagnosis not present

## 2016-08-19 DIAGNOSIS — E119 Type 2 diabetes mellitus without complications: Secondary | ICD-10-CM | POA: Diagnosis not present

## 2016-08-19 DIAGNOSIS — I10 Essential (primary) hypertension: Secondary | ICD-10-CM | POA: Diagnosis not present

## 2016-08-29 DIAGNOSIS — I4891 Unspecified atrial fibrillation: Secondary | ICD-10-CM | POA: Diagnosis not present

## 2016-08-29 DIAGNOSIS — I25810 Atherosclerosis of coronary artery bypass graft(s) without angina pectoris: Secondary | ICD-10-CM | POA: Diagnosis not present

## 2016-08-29 DIAGNOSIS — Z6828 Body mass index (BMI) 28.0-28.9, adult: Secondary | ICD-10-CM | POA: Diagnosis not present

## 2016-08-29 DIAGNOSIS — E1165 Type 2 diabetes mellitus with hyperglycemia: Secondary | ICD-10-CM | POA: Diagnosis not present

## 2016-08-29 DIAGNOSIS — Z713 Dietary counseling and surveillance: Secondary | ICD-10-CM | POA: Diagnosis not present

## 2016-08-29 DIAGNOSIS — Z299 Encounter for prophylactic measures, unspecified: Secondary | ICD-10-CM | POA: Diagnosis not present

## 2016-09-08 IMAGING — NM NM MYOCAR MULTI W/SPECT W/WALL MOTION & EF
2 series · 12 of 12 positions shown · non-contrast
Comparison: none

[Series 1: rest · 8.28mm/px · 6 of 64 frames shown]
[frame 6/64]
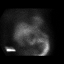
[frame 16/64]
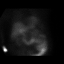
[frame 27/64]
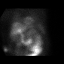
[frame 38/64]
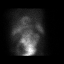
[frame 48/64]
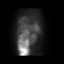
[frame 59/64]
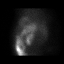

[Series 2: stress gated · 8.28mm/px · 6 of 64 frames shown]
[frame 6/64]
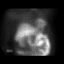
[frame 16/64]
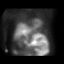
[frame 27/64]
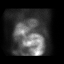
[frame 38/64]
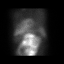
[frame 48/64]
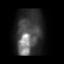
[frame 59/64]
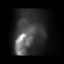

[12 of 12 positions shown; findings below may reference images not displayed]

Canned report from images found in remote index.

Refer to host system for actual result text.

## 2016-09-15 DIAGNOSIS — H26491 Other secondary cataract, right eye: Secondary | ICD-10-CM | POA: Diagnosis not present

## 2016-09-22 DIAGNOSIS — I4891 Unspecified atrial fibrillation: Secondary | ICD-10-CM | POA: Diagnosis not present

## 2016-09-22 DIAGNOSIS — I2510 Atherosclerotic heart disease of native coronary artery without angina pectoris: Secondary | ICD-10-CM | POA: Diagnosis not present

## 2016-09-22 DIAGNOSIS — I10 Essential (primary) hypertension: Secondary | ICD-10-CM | POA: Diagnosis not present

## 2016-09-22 DIAGNOSIS — E119 Type 2 diabetes mellitus without complications: Secondary | ICD-10-CM | POA: Diagnosis not present

## 2016-09-26 DIAGNOSIS — Z6828 Body mass index (BMI) 28.0-28.9, adult: Secondary | ICD-10-CM | POA: Diagnosis not present

## 2016-09-26 DIAGNOSIS — Z299 Encounter for prophylactic measures, unspecified: Secondary | ICD-10-CM | POA: Diagnosis not present

## 2016-09-26 DIAGNOSIS — I4891 Unspecified atrial fibrillation: Secondary | ICD-10-CM | POA: Diagnosis not present

## 2016-09-26 DIAGNOSIS — Z713 Dietary counseling and surveillance: Secondary | ICD-10-CM | POA: Diagnosis not present

## 2016-09-26 DIAGNOSIS — I2510 Atherosclerotic heart disease of native coronary artery without angina pectoris: Secondary | ICD-10-CM | POA: Diagnosis not present

## 2016-10-24 DIAGNOSIS — Z299 Encounter for prophylactic measures, unspecified: Secondary | ICD-10-CM | POA: Diagnosis not present

## 2016-10-24 DIAGNOSIS — I4891 Unspecified atrial fibrillation: Secondary | ICD-10-CM | POA: Diagnosis not present

## 2016-10-24 DIAGNOSIS — I509 Heart failure, unspecified: Secondary | ICD-10-CM | POA: Diagnosis not present

## 2016-11-21 DIAGNOSIS — Z299 Encounter for prophylactic measures, unspecified: Secondary | ICD-10-CM | POA: Diagnosis not present

## 2016-11-21 DIAGNOSIS — E1165 Type 2 diabetes mellitus with hyperglycemia: Secondary | ICD-10-CM | POA: Diagnosis not present

## 2016-11-21 DIAGNOSIS — I351 Nonrheumatic aortic (valve) insufficiency: Secondary | ICD-10-CM | POA: Diagnosis not present

## 2016-11-21 DIAGNOSIS — I4891 Unspecified atrial fibrillation: Secondary | ICD-10-CM | POA: Diagnosis not present

## 2016-11-21 DIAGNOSIS — Z6828 Body mass index (BMI) 28.0-28.9, adult: Secondary | ICD-10-CM | POA: Diagnosis not present

## 2016-11-24 DIAGNOSIS — E119 Type 2 diabetes mellitus without complications: Secondary | ICD-10-CM | POA: Diagnosis not present

## 2016-11-24 DIAGNOSIS — I10 Essential (primary) hypertension: Secondary | ICD-10-CM | POA: Diagnosis not present

## 2016-11-24 DIAGNOSIS — I2510 Atherosclerotic heart disease of native coronary artery without angina pectoris: Secondary | ICD-10-CM | POA: Diagnosis not present

## 2016-11-24 DIAGNOSIS — I4891 Unspecified atrial fibrillation: Secondary | ICD-10-CM | POA: Diagnosis not present

## 2016-11-29 ENCOUNTER — Ambulatory Visit (INDEPENDENT_AMBULATORY_CARE_PROVIDER_SITE_OTHER): Payer: Medicare Other | Admitting: Cardiology

## 2016-11-29 ENCOUNTER — Encounter: Payer: Self-pay | Admitting: Cardiology

## 2016-11-29 VITALS — BP 103/67 | HR 69 | Ht 68.5 in | Wt 190.0 lb

## 2016-11-29 DIAGNOSIS — Z952 Presence of prosthetic heart valve: Secondary | ICD-10-CM | POA: Diagnosis not present

## 2016-11-29 DIAGNOSIS — I2510 Atherosclerotic heart disease of native coronary artery without angina pectoris: Secondary | ICD-10-CM

## 2016-11-29 DIAGNOSIS — I480 Paroxysmal atrial fibrillation: Secondary | ICD-10-CM | POA: Diagnosis not present

## 2016-11-29 DIAGNOSIS — I10 Essential (primary) hypertension: Secondary | ICD-10-CM | POA: Diagnosis not present

## 2016-11-29 NOTE — Patient Instructions (Signed)

## 2016-11-29 NOTE — Progress Notes (Signed)
Cardiology Office Note  Date: 11/29/2016   ID: [name], DOB 1951/03/08, MRN [id number]  PCP: Ignatius Specking, MD  Primary Cardiologist: Nona Dell, MD   Chief Complaint  Patient presents with  . Coronary Artery Disease    History of Present Illness: [name] is a 65 y.o. male last seen in July. He presents for a routine follow-up visit. Overall no major change in stamina. Describes fairly chronic fatigue but is able to perform his ADLs, has to pace himself with outdoor work. He does not report any angina symptoms or nitroglycerin use.  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 His wife is doing the same thing, they alternate weeks.  Cardiac testing from earlier this year is outlined below. I reviewed the results again today. We plan to continue medical therapy and observation.  Current cardiac regimen includes aspirin, Norvasc, Lipitor, Lasix, lisinopril, Lopressor, and as needed nitroglycerin. He also continues on Coumadin with follow-up per Dr. Sherril Croon.  Past Medical History:  Diagnosis Date  . Aortic stenosis   . Atrial fibrillation (HCC)    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 February 2013  . Coronary atherosclerosis of native coronary artery    2 vessel s/p CABG initial surgery in 2004, status post redo coronary bypass grafting x 3 2013  . Essential hypertension, benign   . H/O hiatal hernia   . Hypothyroidism   . Mixed hyperlipidemia   . Myocardial infarction 1998  . Osteoarthritis   . Pneumonia 2010  . Rotator cuff tear    Right shoulder  . Sleep apnea    wears CPAP nightly  . Type 2 diabetes mellitus (HCC)   . Type 2 diabetes mellitus (HCC)     Past Surgical History:  Procedure Laterality Date  . AORTIC VALVE REPLACEMENT  01/12/2012   Procedure: AORTIC VALVE REPLACEMENT (AVR);  Surgeon: Purcell Nails, MD;  Location: [facility] OR;  Service: Open Heart Surgery;  Laterality: N/A;  NO NECKLINE  ON THE RIGHT  . APPENDECTOMY    . CARDIAC CATHETERIZATION  12/21/2011  . CARDIOVERSION  11/18/2011  . CHOLECYSTECTOMY    . CORONARY ARTERY BYPASS GRAFT  04/02/2003   Dr. Cornelius Moras - SVG to OM, composite SVG/RIMA to PDA  . CORONARY ARTERY BYPASS GRAFT  01/12/2012   Procedure: REDO CORONARY ARTERY BYPASS GRAFTING (CABG);  Surgeon: Purcell Nails, MD;  Location: [facility] OR;  Service: Open Heart Surgery;  Laterality: N/A;  Three grafts; Endoscopically harvested left saphenous vein graft and left internal mammary artery  . LEFT AND RIGHT HEART CATHETERIZATION WITH CORONARY ANGIOGRAM N/A 12/21/2011   Procedure: LEFT AND RIGHT HEART CATHETERIZATION WITH CORONARY ANGIOGRAM;  Surgeon: Iran Ouch, MD;  Location: [facility] CATH LAB;  Service: Cardiovascular;  Laterality: N/A;  . MAZE  01/12/2012   Procedure: MAZE;  Surgeon: Purcell Nails, MD;  Location: [facility] OR;  Service: Open Heart Surgery;  Laterality: N/A;  NO NECKLINE ON THE RIGHT  . TONSILLECTOMY      Current Outpatient Prescriptions  Medication Sig Dispense Refill  . allopurinol (ZYLOPRIM) 300 MG tablet Take 300 mg by mouth daily.    Marland Kitchen amLODipine (NORVASC) 5 MG tablet Take 1 tablet (5 mg total) by mouth daily. 90 tablet 1  . aspirin 81 MG tablet Take 81 mg by mouth daily.    Marland Kitchen atorvastatin (LIPITOR) 20 MG tablet Take 1 tablet (20 mg total) by mouth daily. 90 tablet 1  . Canagliflozin (  INVOKANA) 300 MG TABS Take 300 mg by mouth daily.    . finasteride (PROSCAR) 5 MG tablet Take 5 mg by mouth daily.    . furosemide (LASIX) 40 MG tablet Take 1 tablet (40 mg total) by mouth daily. 90 tablet 1  . glipiZIDE (GLUCOTROL) 5 MG tablet Take 5 mg by mouth daily before breakfast.    . glyBURIDE-metformin (GLUCOVANCE) 5-500 MG per tablet Take 2 tablets by mouth 2 (two) times daily.    . iron polysaccharides (NIFEREX) 150 MG capsule Take 150 mg by mouth daily.    Marland Kitchen. levothyroxine (SYNTHROID, LEVOTHROID) 75 MCG tablet Take 75 mcg by mouth daily.    Marland Kitchen. lisinopril  (PRINIVIL,ZESTRIL) 10 MG tablet Take 10 mg by mouth daily.    . metoprolol tartrate (LOPRESSOR) 25 MG tablet Take 25 mg by mouth daily.     . nitroGLYCERIN (NITROSTAT) 0.4 MG SL tablet Place 0.4 mg under the tongue every 5 (five) minutes as needed. For chest pain    . Tamsulosin HCl (FLOMAX) 0.4 MG CAPS Take 0.4 mg by mouth 1 day or 1 dose.    . VOLTAREN 1 % GEL Apply 1 application topically 4 (four) times daily as needed. For pain    . warfarin (COUMADIN) 5 MG tablet Take 0.5 tab (2.5 mg) daily or as directed by [facility] 60 tablet 1   No current facility-administered medications for this visit.    Allergies:  Patient has no known allergies.   Social History: The patient  reports that he quit smoking about 34 years ago. His smoking use included Cigarettes. He has a 42.00 pack-year smoking history. He has never used smokeless tobacco. He reports that he does not drink alcohol or use drugs.   Family History: The patient's family history includes Cancer in his father.   ROS:  Please see the history of present illness. Otherwise, complete review of systems is positive for arthritic stiffness.  All other systems are reviewed and negative.   Physical Exam: VS:  BP 103/67   Pulse 69   Ht 5' 8.5" (1.74 m)   Wt 190 lb (86.2 kg)   SpO2 97%   BMI 28.47 kg/m , BMI Body mass index is 28.47 kg/m.  Wt Readings from Last 3 Encounters:  11/29/16 190 lb (86.2 kg)  06/06/16 200 lb (90.7 kg)  12/02/15 199 lb (90.3 kg)    Overweight male, appears comfortable at rest..  HEENT: Conjunctiva and lids normal, oropharynx clear.  Neck: Supple, no elevated JVP, no thyromegaly.  Lungs: Clear to auscultation, nonlabored breathing at rest.  Thorax: Well-healed sternal incision. Cardiac: Regular rate and rhythm, no S3, 2/6 systolic murmur with prostatic AV click, no pericardial rub.  Abdomen: Soft, nontender, bowel sounds present, no guarding or rebound.  Extremities: No pitting edema, distal  pulses 1-2+. Skin: Warm and dry. Musculoskeletal: No kyphosis. Neuropsychiatric: Alert and oriented 3, affect appropriate.  ECG: I personally reviewed the tracing from 12/02/2015 which showed sinus rhythm with right bundle branch block and old inferolateral infarct pattern.  Recent Labwork:  March 2017: BUN 37, creatinine 1.5, potassium 4.6, AST 18, ALT 27 TSH 3.9, hemoglobin 15.5, platelets 262, cholesterol 157, triglycerides 204, HDL 32, LDL 84  Other Studies Reviewed Today:  Echocardiogram 03/02/2016: Study Conclusions  - Left ventricle: The cavity size was normal. Wall thickness was  increased in a pattern of mild LVH. Systolic function was  vigorous. The estimated ejection fraction was in the range of 65%  to 70%. Diastolic  function is abnormal, indeterminate grade. Wall  motion was normal; there were no regional wall motion  abnormalities. - Aortic valve: There is a 23 mm [employer] Top Hat  mechanical prosthetic heart valve in the AV position. Moderately  calcified annulus. Reported normals for valve peak gradient 24  +/- 8 mmHg and mean gradient 13 +/- 4 mmHg. Measured gradients  are on the high end of normal to mildly elevated. Note this is in  the setting of hyperdynamic LV systolic function. There was no  significant regurgitation. Mean gradient (S): 17 mm Hg. Peak  gradient (S): 36 mm Hg. - Left atrium: The atrium was mildly dilated. - Technically adequate study.  Lexiscan Cardiolite 03/14/2016:  There was no ST segment deviation noted during stress.  Findings consistent with prior inferolateral myocardial infarction with mild peri-infarct ischemia.  This is a low risk study. Overall small area of myocardium currently at jeopardy.  The left ventricular ejection fraction is normal (55-65%).  Assessment and Plan:  1. CAD status post CABG, symptomatically stable on medical therapy. Cardiolite study from April of this year showed a region of  inferolateral scar with mild peri-infarct ischemia. Continue observation in the absence of progressive symptoms.  2. Status post mechanical AVR 2013, echocardiogram from earlier this year as outlined above showing stable function. He continues on Coumadin.  3. Essential hypertension, blood pressure is well controlled today.  4. History of atrial fibrillation status post Cox-Maze procedure with last surgery. He continues on Coumadin. No palpitations.  Current medicines were reviewed with the patient today.   Orders Placed This Encounter  Procedures  . EKG 12-Lead    Disposition: Follow-up in 6 months.  Signed, Jonelle SidleSamuel G. McDowell, MD, [facility] 11/29/2016 10:24 AM    [facility] [address], [place], [place] [address] Phone: [phone]) [phone]; Fax: [phone]

## 2016-12-19 DIAGNOSIS — J069 Acute upper respiratory infection, unspecified: Secondary | ICD-10-CM | POA: Diagnosis not present

## 2016-12-19 DIAGNOSIS — Z6828 Body mass index (BMI) 28.0-28.9, adult: Secondary | ICD-10-CM | POA: Diagnosis not present

## 2016-12-19 DIAGNOSIS — E039 Hypothyroidism, unspecified: Secondary | ICD-10-CM | POA: Diagnosis not present

## 2016-12-19 DIAGNOSIS — E1165 Type 2 diabetes mellitus with hyperglycemia: Secondary | ICD-10-CM | POA: Diagnosis not present

## 2016-12-19 DIAGNOSIS — I4891 Unspecified atrial fibrillation: Secondary | ICD-10-CM | POA: Diagnosis not present

## 2016-12-19 DIAGNOSIS — I509 Heart failure, unspecified: Secondary | ICD-10-CM | POA: Diagnosis not present

## 2016-12-19 DIAGNOSIS — I10 Essential (primary) hypertension: Secondary | ICD-10-CM | POA: Diagnosis not present

## 2016-12-19 DIAGNOSIS — Z713 Dietary counseling and surveillance: Secondary | ICD-10-CM | POA: Diagnosis not present

## 2016-12-19 DIAGNOSIS — Z299 Encounter for prophylactic measures, unspecified: Secondary | ICD-10-CM | POA: Diagnosis not present

## 2017-01-16 DIAGNOSIS — I509 Heart failure, unspecified: Secondary | ICD-10-CM | POA: Diagnosis not present

## 2017-01-16 DIAGNOSIS — N400 Enlarged prostate without lower urinary tract symptoms: Secondary | ICD-10-CM | POA: Diagnosis not present

## 2017-01-16 DIAGNOSIS — Z713 Dietary counseling and surveillance: Secondary | ICD-10-CM | POA: Diagnosis not present

## 2017-01-16 DIAGNOSIS — I4891 Unspecified atrial fibrillation: Secondary | ICD-10-CM | POA: Diagnosis not present

## 2017-01-16 DIAGNOSIS — Z299 Encounter for prophylactic measures, unspecified: Secondary | ICD-10-CM | POA: Diagnosis not present

## 2017-02-01 DIAGNOSIS — I2510 Atherosclerotic heart disease of native coronary artery without angina pectoris: Secondary | ICD-10-CM | POA: Diagnosis not present

## 2017-02-01 DIAGNOSIS — E119 Type 2 diabetes mellitus without complications: Secondary | ICD-10-CM | POA: Diagnosis not present

## 2017-02-01 DIAGNOSIS — I10 Essential (primary) hypertension: Secondary | ICD-10-CM | POA: Diagnosis not present

## 2017-02-01 DIAGNOSIS — I4891 Unspecified atrial fibrillation: Secondary | ICD-10-CM | POA: Diagnosis not present

## 2017-02-13 DIAGNOSIS — E1165 Type 2 diabetes mellitus with hyperglycemia: Secondary | ICD-10-CM | POA: Diagnosis not present

## 2017-02-13 DIAGNOSIS — Z299 Encounter for prophylactic measures, unspecified: Secondary | ICD-10-CM | POA: Diagnosis not present

## 2017-02-13 DIAGNOSIS — Z6829 Body mass index (BMI) 29.0-29.9, adult: Secondary | ICD-10-CM | POA: Diagnosis not present

## 2017-02-13 DIAGNOSIS — E039 Hypothyroidism, unspecified: Secondary | ICD-10-CM | POA: Diagnosis not present

## 2017-02-13 DIAGNOSIS — I4891 Unspecified atrial fibrillation: Secondary | ICD-10-CM | POA: Diagnosis not present

## 2017-02-13 DIAGNOSIS — Z713 Dietary counseling and surveillance: Secondary | ICD-10-CM | POA: Diagnosis not present

## 2017-02-13 DIAGNOSIS — I351 Nonrheumatic aortic (valve) insufficiency: Secondary | ICD-10-CM | POA: Diagnosis not present

## 2017-02-13 DIAGNOSIS — N400 Enlarged prostate without lower urinary tract symptoms: Secondary | ICD-10-CM | POA: Diagnosis not present

## 2017-02-13 DIAGNOSIS — I509 Heart failure, unspecified: Secondary | ICD-10-CM | POA: Diagnosis not present

## 2017-02-13 DIAGNOSIS — I25810 Atherosclerosis of coronary artery bypass graft(s) without angina pectoris: Secondary | ICD-10-CM | POA: Diagnosis not present

## 2017-02-21 DIAGNOSIS — Z0000 Encounter for general adult medical examination without abnormal findings: Secondary | ICD-10-CM | POA: Diagnosis not present

## 2017-02-21 DIAGNOSIS — I509 Heart failure, unspecified: Secondary | ICD-10-CM | POA: Diagnosis not present

## 2017-02-21 DIAGNOSIS — E7800 Pure hypercholesterolemia, unspecified: Secondary | ICD-10-CM | POA: Diagnosis not present

## 2017-02-21 DIAGNOSIS — I351 Nonrheumatic aortic (valve) insufficiency: Secondary | ICD-10-CM | POA: Diagnosis not present

## 2017-02-21 DIAGNOSIS — Z299 Encounter for prophylactic measures, unspecified: Secondary | ICD-10-CM | POA: Diagnosis not present

## 2017-02-21 DIAGNOSIS — Z7189 Other specified counseling: Secondary | ICD-10-CM | POA: Diagnosis not present

## 2017-02-21 DIAGNOSIS — Z713 Dietary counseling and surveillance: Secondary | ICD-10-CM | POA: Diagnosis not present

## 2017-02-21 DIAGNOSIS — Z1211 Encounter for screening for malignant neoplasm of colon: Secondary | ICD-10-CM | POA: Diagnosis not present

## 2017-02-21 DIAGNOSIS — R5383 Other fatigue: Secondary | ICD-10-CM | POA: Diagnosis not present

## 2017-02-21 DIAGNOSIS — I4891 Unspecified atrial fibrillation: Secondary | ICD-10-CM | POA: Diagnosis not present

## 2017-02-21 DIAGNOSIS — E039 Hypothyroidism, unspecified: Secondary | ICD-10-CM | POA: Diagnosis not present

## 2017-02-21 DIAGNOSIS — Z1389 Encounter for screening for other disorder: Secondary | ICD-10-CM | POA: Diagnosis not present

## 2017-02-22 DIAGNOSIS — R5383 Other fatigue: Secondary | ICD-10-CM | POA: Diagnosis not present

## 2017-02-22 DIAGNOSIS — E039 Hypothyroidism, unspecified: Secondary | ICD-10-CM | POA: Diagnosis not present

## 2017-02-22 DIAGNOSIS — Z125 Encounter for screening for malignant neoplasm of prostate: Secondary | ICD-10-CM | POA: Diagnosis not present

## 2017-02-22 DIAGNOSIS — E7800 Pure hypercholesterolemia, unspecified: Secondary | ICD-10-CM | POA: Diagnosis not present

## 2017-02-22 DIAGNOSIS — Z79899 Other long term (current) drug therapy: Secondary | ICD-10-CM | POA: Diagnosis not present

## 2017-03-13 DIAGNOSIS — Z952 Presence of prosthetic heart valve: Secondary | ICD-10-CM | POA: Diagnosis not present

## 2017-03-13 DIAGNOSIS — E1165 Type 2 diabetes mellitus with hyperglycemia: Secondary | ICD-10-CM | POA: Diagnosis not present

## 2017-03-13 DIAGNOSIS — I351 Nonrheumatic aortic (valve) insufficiency: Secondary | ICD-10-CM | POA: Diagnosis not present

## 2017-03-13 DIAGNOSIS — I509 Heart failure, unspecified: Secondary | ICD-10-CM | POA: Diagnosis not present

## 2017-03-13 DIAGNOSIS — Z6828 Body mass index (BMI) 28.0-28.9, adult: Secondary | ICD-10-CM | POA: Diagnosis not present

## 2017-03-13 DIAGNOSIS — I25810 Atherosclerosis of coronary artery bypass graft(s) without angina pectoris: Secondary | ICD-10-CM | POA: Diagnosis not present

## 2017-03-13 DIAGNOSIS — I10 Essential (primary) hypertension: Secondary | ICD-10-CM | POA: Diagnosis not present

## 2017-03-13 DIAGNOSIS — E039 Hypothyroidism, unspecified: Secondary | ICD-10-CM | POA: Diagnosis not present

## 2017-03-13 DIAGNOSIS — Z299 Encounter for prophylactic measures, unspecified: Secondary | ICD-10-CM | POA: Diagnosis not present

## 2017-03-13 DIAGNOSIS — I4891 Unspecified atrial fibrillation: Secondary | ICD-10-CM | POA: Diagnosis not present

## 2017-03-13 DIAGNOSIS — N400 Enlarged prostate without lower urinary tract symptoms: Secondary | ICD-10-CM | POA: Diagnosis not present

## 2017-03-13 DIAGNOSIS — E7800 Pure hypercholesterolemia, unspecified: Secondary | ICD-10-CM | POA: Diagnosis not present

## 2017-03-20 DIAGNOSIS — I4891 Unspecified atrial fibrillation: Secondary | ICD-10-CM | POA: Diagnosis not present

## 2017-03-20 DIAGNOSIS — E119 Type 2 diabetes mellitus without complications: Secondary | ICD-10-CM | POA: Diagnosis not present

## 2017-03-20 DIAGNOSIS — I10 Essential (primary) hypertension: Secondary | ICD-10-CM | POA: Diagnosis not present

## 2017-03-20 DIAGNOSIS — I2510 Atherosclerotic heart disease of native coronary artery without angina pectoris: Secondary | ICD-10-CM | POA: Diagnosis not present

## 2017-04-04 DIAGNOSIS — H52209 Unspecified astigmatism, unspecified eye: Secondary | ICD-10-CM | POA: Diagnosis not present

## 2017-04-04 DIAGNOSIS — Z961 Presence of intraocular lens: Secondary | ICD-10-CM | POA: Diagnosis not present

## 2017-04-04 DIAGNOSIS — E119 Type 2 diabetes mellitus without complications: Secondary | ICD-10-CM | POA: Diagnosis not present

## 2017-04-10 DIAGNOSIS — N400 Enlarged prostate without lower urinary tract symptoms: Secondary | ICD-10-CM | POA: Diagnosis not present

## 2017-04-10 DIAGNOSIS — I10 Essential (primary) hypertension: Secondary | ICD-10-CM | POA: Diagnosis not present

## 2017-04-10 DIAGNOSIS — I4891 Unspecified atrial fibrillation: Secondary | ICD-10-CM | POA: Diagnosis not present

## 2017-04-10 DIAGNOSIS — Z299 Encounter for prophylactic measures, unspecified: Secondary | ICD-10-CM | POA: Diagnosis not present

## 2017-04-10 DIAGNOSIS — E039 Hypothyroidism, unspecified: Secondary | ICD-10-CM | POA: Diagnosis not present

## 2017-04-10 DIAGNOSIS — E1165 Type 2 diabetes mellitus with hyperglycemia: Secondary | ICD-10-CM | POA: Diagnosis not present

## 2017-04-10 DIAGNOSIS — I351 Nonrheumatic aortic (valve) insufficiency: Secondary | ICD-10-CM | POA: Diagnosis not present

## 2017-04-10 DIAGNOSIS — E7800 Pure hypercholesterolemia, unspecified: Secondary | ICD-10-CM | POA: Diagnosis not present

## 2017-04-10 DIAGNOSIS — Z6829 Body mass index (BMI) 29.0-29.9, adult: Secondary | ICD-10-CM | POA: Diagnosis not present

## 2017-04-10 DIAGNOSIS — I509 Heart failure, unspecified: Secondary | ICD-10-CM | POA: Diagnosis not present

## 2017-04-10 DIAGNOSIS — I25810 Atherosclerosis of coronary artery bypass graft(s) without angina pectoris: Secondary | ICD-10-CM | POA: Diagnosis not present

## 2017-04-10 DIAGNOSIS — M109 Gout, unspecified: Secondary | ICD-10-CM | POA: Diagnosis not present

## 2017-04-25 DIAGNOSIS — I10 Essential (primary) hypertension: Secondary | ICD-10-CM | POA: Diagnosis not present

## 2017-04-25 DIAGNOSIS — I2510 Atherosclerotic heart disease of native coronary artery without angina pectoris: Secondary | ICD-10-CM | POA: Diagnosis not present

## 2017-04-25 DIAGNOSIS — E119 Type 2 diabetes mellitus without complications: Secondary | ICD-10-CM | POA: Diagnosis not present

## 2017-04-25 DIAGNOSIS — I4891 Unspecified atrial fibrillation: Secondary | ICD-10-CM | POA: Diagnosis not present

## 2017-05-08 DIAGNOSIS — I340 Nonrheumatic mitral (valve) insufficiency: Secondary | ICD-10-CM | POA: Diagnosis not present

## 2017-05-08 DIAGNOSIS — I351 Nonrheumatic aortic (valve) insufficiency: Secondary | ICD-10-CM | POA: Diagnosis not present

## 2017-05-08 DIAGNOSIS — I509 Heart failure, unspecified: Secondary | ICD-10-CM | POA: Diagnosis not present

## 2017-05-08 DIAGNOSIS — I25810 Atherosclerosis of coronary artery bypass graft(s) without angina pectoris: Secondary | ICD-10-CM | POA: Diagnosis not present

## 2017-05-08 DIAGNOSIS — E7800 Pure hypercholesterolemia, unspecified: Secondary | ICD-10-CM | POA: Diagnosis not present

## 2017-05-08 DIAGNOSIS — I4891 Unspecified atrial fibrillation: Secondary | ICD-10-CM | POA: Diagnosis not present

## 2017-05-08 DIAGNOSIS — I2510 Atherosclerotic heart disease of native coronary artery without angina pectoris: Secondary | ICD-10-CM | POA: Diagnosis not present

## 2017-05-08 DIAGNOSIS — I10 Essential (primary) hypertension: Secondary | ICD-10-CM | POA: Diagnosis not present

## 2017-05-08 DIAGNOSIS — E1165 Type 2 diabetes mellitus with hyperglycemia: Secondary | ICD-10-CM | POA: Diagnosis not present

## 2017-05-08 DIAGNOSIS — Z6828 Body mass index (BMI) 28.0-28.9, adult: Secondary | ICD-10-CM | POA: Diagnosis not present

## 2017-05-08 DIAGNOSIS — N400 Enlarged prostate without lower urinary tract symptoms: Secondary | ICD-10-CM | POA: Diagnosis not present

## 2017-05-08 DIAGNOSIS — Z299 Encounter for prophylactic measures, unspecified: Secondary | ICD-10-CM | POA: Diagnosis not present

## 2017-05-22 DIAGNOSIS — I509 Heart failure, unspecified: Secondary | ICD-10-CM | POA: Diagnosis not present

## 2017-05-22 DIAGNOSIS — E039 Hypothyroidism, unspecified: Secondary | ICD-10-CM | POA: Diagnosis not present

## 2017-05-22 DIAGNOSIS — I25810 Atherosclerosis of coronary artery bypass graft(s) without angina pectoris: Secondary | ICD-10-CM | POA: Diagnosis not present

## 2017-05-22 DIAGNOSIS — E7800 Pure hypercholesterolemia, unspecified: Secondary | ICD-10-CM | POA: Diagnosis not present

## 2017-05-22 DIAGNOSIS — N400 Enlarged prostate without lower urinary tract symptoms: Secondary | ICD-10-CM | POA: Diagnosis not present

## 2017-05-22 DIAGNOSIS — Z299 Encounter for prophylactic measures, unspecified: Secondary | ICD-10-CM | POA: Diagnosis not present

## 2017-05-22 DIAGNOSIS — I4891 Unspecified atrial fibrillation: Secondary | ICD-10-CM | POA: Diagnosis not present

## 2017-05-22 DIAGNOSIS — I2510 Atherosclerotic heart disease of native coronary artery without angina pectoris: Secondary | ICD-10-CM | POA: Diagnosis not present

## 2017-05-22 DIAGNOSIS — I10 Essential (primary) hypertension: Secondary | ICD-10-CM | POA: Diagnosis not present

## 2017-05-22 DIAGNOSIS — Z6828 Body mass index (BMI) 28.0-28.9, adult: Secondary | ICD-10-CM | POA: Diagnosis not present

## 2017-05-22 DIAGNOSIS — E1165 Type 2 diabetes mellitus with hyperglycemia: Secondary | ICD-10-CM | POA: Diagnosis not present

## 2017-06-01 ENCOUNTER — Ambulatory Visit: Payer: Medicare Other | Admitting: Cardiology

## 2017-06-19 DIAGNOSIS — N400 Enlarged prostate without lower urinary tract symptoms: Secondary | ICD-10-CM | POA: Diagnosis not present

## 2017-06-19 DIAGNOSIS — I351 Nonrheumatic aortic (valve) insufficiency: Secondary | ICD-10-CM | POA: Diagnosis not present

## 2017-06-19 DIAGNOSIS — I509 Heart failure, unspecified: Secondary | ICD-10-CM | POA: Diagnosis not present

## 2017-06-19 DIAGNOSIS — E1165 Type 2 diabetes mellitus with hyperglycemia: Secondary | ICD-10-CM | POA: Diagnosis not present

## 2017-06-19 DIAGNOSIS — Z299 Encounter for prophylactic measures, unspecified: Secondary | ICD-10-CM | POA: Diagnosis not present

## 2017-06-19 DIAGNOSIS — E663 Overweight: Secondary | ICD-10-CM | POA: Diagnosis not present

## 2017-06-19 DIAGNOSIS — E7800 Pure hypercholesterolemia, unspecified: Secondary | ICD-10-CM | POA: Diagnosis not present

## 2017-06-19 DIAGNOSIS — E039 Hypothyroidism, unspecified: Secondary | ICD-10-CM | POA: Diagnosis not present

## 2017-06-19 DIAGNOSIS — I10 Essential (primary) hypertension: Secondary | ICD-10-CM | POA: Diagnosis not present

## 2017-06-19 DIAGNOSIS — I4891 Unspecified atrial fibrillation: Secondary | ICD-10-CM | POA: Diagnosis not present

## 2017-06-19 DIAGNOSIS — I2510 Atherosclerotic heart disease of native coronary artery without angina pectoris: Secondary | ICD-10-CM | POA: Diagnosis not present

## 2017-07-25 DIAGNOSIS — Z6827 Body mass index (BMI) 27.0-27.9, adult: Secondary | ICD-10-CM | POA: Diagnosis not present

## 2017-07-25 DIAGNOSIS — E1165 Type 2 diabetes mellitus with hyperglycemia: Secondary | ICD-10-CM | POA: Diagnosis not present

## 2017-07-25 DIAGNOSIS — I10 Essential (primary) hypertension: Secondary | ICD-10-CM | POA: Diagnosis not present

## 2017-07-25 DIAGNOSIS — I4891 Unspecified atrial fibrillation: Secondary | ICD-10-CM | POA: Diagnosis not present

## 2017-07-25 DIAGNOSIS — Z952 Presence of prosthetic heart valve: Secondary | ICD-10-CM | POA: Diagnosis not present

## 2017-07-25 DIAGNOSIS — E7800 Pure hypercholesterolemia, unspecified: Secondary | ICD-10-CM | POA: Diagnosis not present

## 2017-07-25 DIAGNOSIS — Z299 Encounter for prophylactic measures, unspecified: Secondary | ICD-10-CM | POA: Diagnosis not present

## 2017-07-25 DIAGNOSIS — N400 Enlarged prostate without lower urinary tract symptoms: Secondary | ICD-10-CM | POA: Diagnosis not present

## 2017-07-25 DIAGNOSIS — I2510 Atherosclerotic heart disease of native coronary artery without angina pectoris: Secondary | ICD-10-CM | POA: Diagnosis not present

## 2017-07-31 DIAGNOSIS — E119 Type 2 diabetes mellitus without complications: Secondary | ICD-10-CM | POA: Diagnosis not present

## 2017-07-31 DIAGNOSIS — I2510 Atherosclerotic heart disease of native coronary artery without angina pectoris: Secondary | ICD-10-CM | POA: Diagnosis not present

## 2017-07-31 DIAGNOSIS — I10 Essential (primary) hypertension: Secondary | ICD-10-CM | POA: Diagnosis not present

## 2017-07-31 DIAGNOSIS — I4891 Unspecified atrial fibrillation: Secondary | ICD-10-CM | POA: Diagnosis not present

## 2017-08-22 DIAGNOSIS — Z713 Dietary counseling and surveillance: Secondary | ICD-10-CM | POA: Diagnosis not present

## 2017-08-22 DIAGNOSIS — Z6827 Body mass index (BMI) 27.0-27.9, adult: Secondary | ICD-10-CM | POA: Diagnosis not present

## 2017-08-22 DIAGNOSIS — I4891 Unspecified atrial fibrillation: Secondary | ICD-10-CM | POA: Diagnosis not present

## 2017-08-22 DIAGNOSIS — I509 Heart failure, unspecified: Secondary | ICD-10-CM | POA: Diagnosis not present

## 2017-08-22 DIAGNOSIS — Z299 Encounter for prophylactic measures, unspecified: Secondary | ICD-10-CM | POA: Diagnosis not present

## 2017-09-12 DIAGNOSIS — E1165 Type 2 diabetes mellitus with hyperglycemia: Secondary | ICD-10-CM | POA: Diagnosis not present

## 2017-09-12 DIAGNOSIS — I4891 Unspecified atrial fibrillation: Secondary | ICD-10-CM | POA: Diagnosis not present

## 2017-09-12 DIAGNOSIS — Z299 Encounter for prophylactic measures, unspecified: Secondary | ICD-10-CM | POA: Diagnosis not present

## 2017-09-12 DIAGNOSIS — I509 Heart failure, unspecified: Secondary | ICD-10-CM | POA: Diagnosis not present

## 2017-09-12 DIAGNOSIS — Z23 Encounter for immunization: Secondary | ICD-10-CM | POA: Diagnosis not present

## 2017-09-12 DIAGNOSIS — Z6828 Body mass index (BMI) 28.0-28.9, adult: Secondary | ICD-10-CM | POA: Diagnosis not present

## 2017-09-21 DIAGNOSIS — I2510 Atherosclerotic heart disease of native coronary artery without angina pectoris: Secondary | ICD-10-CM | POA: Diagnosis not present

## 2017-09-21 DIAGNOSIS — I10 Essential (primary) hypertension: Secondary | ICD-10-CM | POA: Diagnosis not present

## 2017-09-21 DIAGNOSIS — I4891 Unspecified atrial fibrillation: Secondary | ICD-10-CM | POA: Diagnosis not present

## 2017-09-21 DIAGNOSIS — E119 Type 2 diabetes mellitus without complications: Secondary | ICD-10-CM | POA: Diagnosis not present

## 2017-09-25 DIAGNOSIS — I4891 Unspecified atrial fibrillation: Secondary | ICD-10-CM | POA: Diagnosis not present

## 2017-09-25 DIAGNOSIS — Z6828 Body mass index (BMI) 28.0-28.9, adult: Secondary | ICD-10-CM | POA: Diagnosis not present

## 2017-09-25 DIAGNOSIS — E1165 Type 2 diabetes mellitus with hyperglycemia: Secondary | ICD-10-CM | POA: Diagnosis not present

## 2017-09-25 DIAGNOSIS — I509 Heart failure, unspecified: Secondary | ICD-10-CM | POA: Diagnosis not present

## 2017-09-25 DIAGNOSIS — Z299 Encounter for prophylactic measures, unspecified: Secondary | ICD-10-CM | POA: Diagnosis not present

## 2017-10-10 DIAGNOSIS — Z6827 Body mass index (BMI) 27.0-27.9, adult: Secondary | ICD-10-CM | POA: Diagnosis not present

## 2017-10-10 DIAGNOSIS — Z299 Encounter for prophylactic measures, unspecified: Secondary | ICD-10-CM | POA: Diagnosis not present

## 2017-10-10 DIAGNOSIS — I509 Heart failure, unspecified: Secondary | ICD-10-CM | POA: Diagnosis not present

## 2017-10-10 DIAGNOSIS — E1165 Type 2 diabetes mellitus with hyperglycemia: Secondary | ICD-10-CM | POA: Diagnosis not present

## 2017-10-10 DIAGNOSIS — I4891 Unspecified atrial fibrillation: Secondary | ICD-10-CM | POA: Diagnosis not present

## 2017-11-03 DIAGNOSIS — I4891 Unspecified atrial fibrillation: Secondary | ICD-10-CM | POA: Diagnosis not present

## 2017-11-03 DIAGNOSIS — I10 Essential (primary) hypertension: Secondary | ICD-10-CM | POA: Diagnosis not present

## 2017-11-03 DIAGNOSIS — I2510 Atherosclerotic heart disease of native coronary artery without angina pectoris: Secondary | ICD-10-CM | POA: Diagnosis not present

## 2017-11-03 DIAGNOSIS — E119 Type 2 diabetes mellitus without complications: Secondary | ICD-10-CM | POA: Diagnosis not present

## 2017-11-08 DIAGNOSIS — I4891 Unspecified atrial fibrillation: Secondary | ICD-10-CM | POA: Diagnosis not present

## 2017-11-08 DIAGNOSIS — Z299 Encounter for prophylactic measures, unspecified: Secondary | ICD-10-CM | POA: Diagnosis not present

## 2017-11-08 DIAGNOSIS — Z713 Dietary counseling and surveillance: Secondary | ICD-10-CM | POA: Diagnosis not present

## 2017-11-08 DIAGNOSIS — Z6827 Body mass index (BMI) 27.0-27.9, adult: Secondary | ICD-10-CM | POA: Diagnosis not present

## 2017-11-08 DIAGNOSIS — I10 Essential (primary) hypertension: Secondary | ICD-10-CM | POA: Diagnosis not present

## 2017-12-01 DIAGNOSIS — I10 Essential (primary) hypertension: Secondary | ICD-10-CM | POA: Diagnosis not present

## 2017-12-01 DIAGNOSIS — I2510 Atherosclerotic heart disease of native coronary artery without angina pectoris: Secondary | ICD-10-CM | POA: Diagnosis not present

## 2017-12-01 DIAGNOSIS — E119 Type 2 diabetes mellitus without complications: Secondary | ICD-10-CM | POA: Diagnosis not present

## 2017-12-01 DIAGNOSIS — I4891 Unspecified atrial fibrillation: Secondary | ICD-10-CM | POA: Diagnosis not present

## 2017-12-08 DIAGNOSIS — Z6827 Body mass index (BMI) 27.0-27.9, adult: Secondary | ICD-10-CM | POA: Diagnosis not present

## 2017-12-08 DIAGNOSIS — Z299 Encounter for prophylactic measures, unspecified: Secondary | ICD-10-CM | POA: Diagnosis not present

## 2017-12-08 DIAGNOSIS — I4891 Unspecified atrial fibrillation: Secondary | ICD-10-CM | POA: Diagnosis not present

## 2017-12-08 DIAGNOSIS — Z713 Dietary counseling and surveillance: Secondary | ICD-10-CM | POA: Diagnosis not present

## 2018-01-05 DIAGNOSIS — Z87891 Personal history of nicotine dependence: Secondary | ICD-10-CM | POA: Diagnosis not present

## 2018-01-05 DIAGNOSIS — I509 Heart failure, unspecified: Secondary | ICD-10-CM | POA: Diagnosis not present

## 2018-01-05 DIAGNOSIS — Z6827 Body mass index (BMI) 27.0-27.9, adult: Secondary | ICD-10-CM | POA: Diagnosis not present

## 2018-01-05 DIAGNOSIS — Z299 Encounter for prophylactic measures, unspecified: Secondary | ICD-10-CM | POA: Diagnosis not present

## 2018-01-05 DIAGNOSIS — I4891 Unspecified atrial fibrillation: Secondary | ICD-10-CM | POA: Diagnosis not present

## 2018-01-05 DIAGNOSIS — E1165 Type 2 diabetes mellitus with hyperglycemia: Secondary | ICD-10-CM | POA: Diagnosis not present

## 2018-01-05 DIAGNOSIS — I10 Essential (primary) hypertension: Secondary | ICD-10-CM | POA: Diagnosis not present

## 2018-02-02 DIAGNOSIS — E1165 Type 2 diabetes mellitus with hyperglycemia: Secondary | ICD-10-CM | POA: Diagnosis not present

## 2018-02-02 DIAGNOSIS — I509 Heart failure, unspecified: Secondary | ICD-10-CM | POA: Diagnosis not present

## 2018-02-02 DIAGNOSIS — I4891 Unspecified atrial fibrillation: Secondary | ICD-10-CM | POA: Diagnosis not present

## 2018-02-02 DIAGNOSIS — I25810 Atherosclerosis of coronary artery bypass graft(s) without angina pectoris: Secondary | ICD-10-CM | POA: Diagnosis not present

## 2018-02-02 DIAGNOSIS — Z6827 Body mass index (BMI) 27.0-27.9, adult: Secondary | ICD-10-CM | POA: Diagnosis not present

## 2018-02-02 DIAGNOSIS — Z299 Encounter for prophylactic measures, unspecified: Secondary | ICD-10-CM | POA: Diagnosis not present

## 2018-02-02 DIAGNOSIS — I10 Essential (primary) hypertension: Secondary | ICD-10-CM | POA: Diagnosis not present

## 2018-02-14 DIAGNOSIS — I10 Essential (primary) hypertension: Secondary | ICD-10-CM | POA: Diagnosis not present

## 2018-02-14 DIAGNOSIS — I2510 Atherosclerotic heart disease of native coronary artery without angina pectoris: Secondary | ICD-10-CM | POA: Diagnosis not present

## 2018-02-14 DIAGNOSIS — E119 Type 2 diabetes mellitus without complications: Secondary | ICD-10-CM | POA: Diagnosis not present

## 2018-02-14 DIAGNOSIS — I4891 Unspecified atrial fibrillation: Secondary | ICD-10-CM | POA: Diagnosis not present

## 2018-02-28 DIAGNOSIS — I4891 Unspecified atrial fibrillation: Secondary | ICD-10-CM | POA: Diagnosis not present

## 2018-02-28 DIAGNOSIS — I10 Essential (primary) hypertension: Secondary | ICD-10-CM | POA: Diagnosis not present

## 2018-02-28 DIAGNOSIS — Z0000 Encounter for general adult medical examination without abnormal findings: Secondary | ICD-10-CM | POA: Diagnosis not present

## 2018-02-28 DIAGNOSIS — Z6827 Body mass index (BMI) 27.0-27.9, adult: Secondary | ICD-10-CM | POA: Diagnosis not present

## 2018-02-28 DIAGNOSIS — Z125 Encounter for screening for malignant neoplasm of prostate: Secondary | ICD-10-CM | POA: Diagnosis not present

## 2018-02-28 DIAGNOSIS — I509 Heart failure, unspecified: Secondary | ICD-10-CM | POA: Diagnosis not present

## 2018-02-28 DIAGNOSIS — R5383 Other fatigue: Secondary | ICD-10-CM | POA: Diagnosis not present

## 2018-02-28 DIAGNOSIS — Z1339 Encounter for screening examination for other mental health and behavioral disorders: Secondary | ICD-10-CM | POA: Diagnosis not present

## 2018-02-28 DIAGNOSIS — Z299 Encounter for prophylactic measures, unspecified: Secondary | ICD-10-CM | POA: Diagnosis not present

## 2018-02-28 DIAGNOSIS — Z79899 Other long term (current) drug therapy: Secondary | ICD-10-CM | POA: Diagnosis not present

## 2018-02-28 DIAGNOSIS — Z1331 Encounter for screening for depression: Secondary | ICD-10-CM | POA: Diagnosis not present

## 2018-02-28 DIAGNOSIS — Z1211 Encounter for screening for malignant neoplasm of colon: Secondary | ICD-10-CM | POA: Diagnosis not present

## 2018-02-28 DIAGNOSIS — Z7189 Other specified counseling: Secondary | ICD-10-CM | POA: Diagnosis not present

## 2018-03-05 DIAGNOSIS — Z961 Presence of intraocular lens: Secondary | ICD-10-CM | POA: Diagnosis not present

## 2018-03-05 DIAGNOSIS — H52209 Unspecified astigmatism, unspecified eye: Secondary | ICD-10-CM | POA: Diagnosis not present

## 2018-03-05 DIAGNOSIS — E119 Type 2 diabetes mellitus without complications: Secondary | ICD-10-CM | POA: Diagnosis not present

## 2018-03-07 DIAGNOSIS — E119 Type 2 diabetes mellitus without complications: Secondary | ICD-10-CM | POA: Diagnosis not present

## 2018-03-07 DIAGNOSIS — I4891 Unspecified atrial fibrillation: Secondary | ICD-10-CM | POA: Diagnosis not present

## 2018-03-07 DIAGNOSIS — I10 Essential (primary) hypertension: Secondary | ICD-10-CM | POA: Diagnosis not present

## 2018-03-07 DIAGNOSIS — I2510 Atherosclerotic heart disease of native coronary artery without angina pectoris: Secondary | ICD-10-CM | POA: Diagnosis not present

## 2018-03-16 ENCOUNTER — Ambulatory Visit: Payer: Medicare Other | Admitting: Cardiology

## 2018-03-16 DIAGNOSIS — I509 Heart failure, unspecified: Secondary | ICD-10-CM | POA: Diagnosis not present

## 2018-03-16 DIAGNOSIS — D72829 Elevated white blood cell count, unspecified: Secondary | ICD-10-CM | POA: Diagnosis not present

## 2018-03-16 DIAGNOSIS — E1165 Type 2 diabetes mellitus with hyperglycemia: Secondary | ICD-10-CM | POA: Diagnosis not present

## 2018-03-16 DIAGNOSIS — I4891 Unspecified atrial fibrillation: Secondary | ICD-10-CM | POA: Diagnosis not present

## 2018-03-16 DIAGNOSIS — Z299 Encounter for prophylactic measures, unspecified: Secondary | ICD-10-CM | POA: Diagnosis not present

## 2018-03-16 DIAGNOSIS — R5383 Other fatigue: Secondary | ICD-10-CM | POA: Diagnosis not present

## 2018-03-16 DIAGNOSIS — Z6826 Body mass index (BMI) 26.0-26.9, adult: Secondary | ICD-10-CM | POA: Diagnosis not present

## 2018-03-21 NOTE — Progress Notes (Signed)
Cardiology Office Note  Date: 03/23/2018   ID: [name], DOB 02/21/1951, MRN [id number]  PCP: Ignatius SpeckingVyas, Dhruv B, MD  Primary Cardiologist: Nona DellSamuel Eero Dini, MD   Chief Complaint  Patient presents with  . Coronary Artery Disease  . Atrial Fibrillation    History of Present Illness: Derrick SaasDoyle W Idler is a 67 y.o. male last seen in the office in December 2017.  He presents for a follow-up visit.  At this time he does not report any angina symptoms and describes NYHA class II dyspnea.  No palpitations or syncope.  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  He continues on Coumadin with follow-up per Dr. Sherril CroonVyas.  Denies any bleeding problems.  I reviewed his medications.  Current cardiac regimen includes low-dose aspirin, Norvasc, Lipitor, Lasix, lisinopril, Lopressor, and as needed nitroglycerin.  I personally reviewed his ECG today which shows sinus rhythm with right bundle branch block and possible RVH as well as old inferolateral infarct pattern.  He underwent follow-up ischemic and structural cardiac testing within the last 2 years.  Past Medical History:  Diagnosis Date  . Aortic stenosis   . Atrial fibrillation (HCC)    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 February 2013  . Coronary atherosclerosis of native coronary artery    2 vessel s/p CABG initial surgery in 2004, status post redo coronary bypass grafting x 3 2013  . Essential hypertension, benign   . H/O hiatal hernia   . Hypothyroidism   . Mixed hyperlipidemia   . Myocardial infarction (HCC) 1998  . Osteoarthritis   . Pneumonia 2010  . Rotator cuff tear    Right shoulder  . Sleep apnea    wears CPAP nightly  . Type 2 diabetes mellitus (HCC)     Past Surgical History:  Procedure Laterality Date  . AORTIC VALVE REPLACEMENT  01/12/2012   Procedure: AORTIC VALVE REPLACEMENT (AVR);  Surgeon: Purcell Nailslarence H Owen, MD;  [facility];  Service: Open Heart Surgery;  Laterality: N/A;  NO  NECKLINE ON THE RIGHT  . APPENDECTOMY    . CARDIAC CATHETERIZATION  12/21/2011  . CARDIOVERSION  11/18/2011  . CHOLECYSTECTOMY    . CORONARY ARTERY BYPASS GRAFT  04/02/2003   Dr. Cornelius Moraswen - SVG to OM, composite SVG/RIMA to PDA  . CORONARY ARTERY BYPASS GRAFT  01/12/2012   Procedure: REDO CORONARY ARTERY BYPASS GRAFTING (CABG);  Surgeon: Purcell Nailslarence H Owen, MD;  [facility];  Service: Open Heart Surgery;  Laterality: N/A;  Three grafts; Endoscopically harvested left saphenous vein graft and left internal mammary artery  . LEFT AND RIGHT HEART CATHETERIZATION WITH CORONARY ANGIOGRAM N/A 12/21/2011   Procedure: LEFT AND RIGHT HEART CATHETERIZATION WITH CORONARY ANGIOGRAM;  Surgeon: Iran OuchMuhammad A Arida, MD;  Location: [facility] C[facility] LAB;  Service: Cardiovascular;  Laterality: N/A;  . MAZE  01/12/2012   Procedure: MAZE;  Surgeon: Purcell Nailslarence H Owen, MD;  [facility];  Service: Open Heart Surgery;  Laterality: N/A;  NO NECKLINE ON THE RIGHT  . TONSILLECTOMY      Current Outpatient Medications  Medication Sig Dispense Refill  . allopurinol (ZYLOPRIM) 300 MG tablet Take 300 mg by mouth daily.    Marland Kitchen. amLODipine (NORVASC) 5 MG tablet Take 1 tablet (5 mg total) by mouth daily. 90 tablet 1  . aspirin 81 MG tablet Take 81 mg by mouth daily.    Marland Kitchen. atorvastatin (LIPITOR) 20 MG tablet Take 1 tablet (20 mg total) by mouth daily.  90 tablet 1  . Canagliflozin (INVOKANA) 300 MG TABS Take 300 mg by mouth daily.    . furosemide (LASIX) 40 MG tablet Take 1 tablet (40 mg total) by mouth daily. 90 tablet 1  . glipiZIDE (GLUCOTROL) 5 MG tablet Take 5 mg by mouth daily before breakfast.    . glyBURIDE-metformin (GLUCOVANCE) 5-500 MG per tablet Take 2 tablets by mouth 2 (two) times daily.    . iron polysaccharides (NIFEREX) 150 MG capsule Take 150 mg by mouth daily.    Marland Kitchen levothyroxine (SYNTHROID, LEVOTHROID) 75 MCG tablet Take 75 mcg by mouth daily.    Marland Kitchen lisinopril (PRINIVIL,ZESTRIL) 10 MG tablet Take 10 mg by mouth daily.    .  metoprolol tartrate (LOPRESSOR) 25 MG tablet Take 25 mg by mouth daily.     . nitroGLYCERIN (NITROSTAT) 0.4 MG SL tablet Place 0.4 mg under the tongue every 5 (five) minutes as needed. For chest pain    . Tamsulosin HCl (FLOMAX) 0.4 MG CAPS Take 0.4 mg by mouth 1 day or 1 dose.    . warfarin (COUMADIN) 5 MG tablet Take 0.5 tab (2.5 mg) daily or as directed by Coumadin Clinic 60 tablet 1   No current facility-administered medications for this visit.    Allergies:  Patient has no known allergies.   Social History: The patient  reports that he quit smoking about 35 years ago. His smoking use included cigarettes. He has a 42.00 pack-year smoking history. He has never used smokeless tobacco. He reports that he does not drink alcohol or use drugs.   ROS:  Please see the history of present illness. Otherwise, complete review of systems is positive for none.  All other systems are reviewed and negative.   Physical Exam: VS:  BP 107/67   Pulse 66   Ht 5' 8.5" (1.74 m)   Wt 182 lb 6.4 oz (82.7 kg)   SpO2 97%   BMI 27.33 kg/m , BMI Body mass index is 27.33 kg/m.  Wt Readings from Last 3 Encounters:  03/23/18 182 lb 6.4 oz (82.7 kg)  11/29/16 190 lb (86.2 kg)  06/06/16 200 lb (90.7 kg)    General: Patient appears comfortable at rest. HEENT: Conjunctiva and lids normal, oropharynx clear. Neck: Supple, no elevated JVP or carotid bruits, no thyromegaly. Lungs: Clear to auscultation, nonlabored breathing at rest. Thorax: Well-healed sternal incision. Cardiac: Regular rate and rhythm, no S3, 2/6 systolic murmur, no pericardial rub. Abdomen: Soft, nontender, bowel sounds present. Extremities: No pitting edema, distal pulses 2+. Skin: Warm and dry. Musculoskeletal: No kyphosis. Neuropsychiatric: Alert and oriented x3, affect grossly appropriate.  ECG: I personally reviewed the tracing from 11/29/2016 which showed sinus rhythm with right bundle branch block, old inferolateral infarct pattern and  LVH.  Other Studies Reviewed Today:  Echocardiogram 03/02/2016: Study Conclusions  - Left ventricle: The cavity size was normal. Wall thickness was  increased in a pattern of mild LVH. Systolic function was  vigorous. The estimated ejection fraction was in the range of 65%  to 70%. Diastolic function is abnormal, indeterminate grade. Wall  motion was normal; there were no regional wall motion  abnormalities. - Aortic valve: There is a 23 mm Sorin [employer] Top Hat  mechanical prosthetic heart valve in the AV position. Moderately  calcified annulus. Reported normals for valve peak gradient 24  +/- 8 mmHg and mean gradient 13 +/- 4 mmHg. Measured gradients  are on the high end of normal to mildly elevated. Note this is in  the setting of hyperdynamic LV systolic function. There was no  significant regurgitation. Mean gradient (S): 17 mm Hg. Peak  gradient (S): 36 mm Hg. - Left atrium: The atrium was mildly dilated. - Technically adequate study.  Lexiscan Cardiolite 03/14/2016:  There was no ST segment deviation noted during stress.  Findings consistent with prior inferolateral myocardial infarction with mild peri-infarct ischemia.  This is a low risk study. Overall small area of myocardium currently at jeopardy.  The left ventricular ejection fraction is normal (55-65%).  Assessment and Plan:  1.  Multivessel CAD status post CABG with redo operation in 2013.  He does not report any angina on medical therapy and underwent a low risk Cardiolite within the last 2 years.  ECG reviewed and stable.  Continue with observation.  2.  Aortic stenosis status post mechanical AVR in 2013 at the time of redo CABG.  Cardiac murmur is stable and echocardiogram from 2017 showed grossly normal valve function.  He continues on Coumadin.  3.  History of atrial fibrillation with previous Cox-Maze procedure.  He is in sinus rhythm by ECG today. No palpitations.  4.  Mixed  hyperlipidemia, on Lipitor.  Requesting most recent lab work from Dr. Sherril Croon.  Current medicines were reviewed with the patient today.   Orders Placed This Encounter  Procedures  . EKG 12-Lead    Disposition: Follow-up in 1 year.  Signed, Jonelle Sidle, MD, [facility] 03/23/2018 10:48 AM    [facility] at [facility] [employer] [address] [place], [place], [place] [address] Phone: [phone]; Fax: [phone]

## 2018-03-23 ENCOUNTER — Ambulatory Visit (INDEPENDENT_AMBULATORY_CARE_PROVIDER_SITE_OTHER): Payer: Medicare Other | Admitting: Cardiology

## 2018-03-23 ENCOUNTER — Encounter: Payer: Self-pay | Admitting: *Deleted

## 2018-03-23 ENCOUNTER — Encounter: Payer: Self-pay | Admitting: Cardiology

## 2018-03-23 VITALS — BP 107/67 | HR 66 | Ht 68.5 in | Wt 182.4 lb

## 2018-03-23 DIAGNOSIS — E782 Mixed hyperlipidemia: Secondary | ICD-10-CM

## 2018-03-23 DIAGNOSIS — I480 Paroxysmal atrial fibrillation: Secondary | ICD-10-CM | POA: Diagnosis not present

## 2018-03-23 DIAGNOSIS — Z952 Presence of prosthetic heart valve: Secondary | ICD-10-CM

## 2018-03-23 DIAGNOSIS — I2510 Atherosclerotic heart disease of native coronary artery without angina pectoris: Secondary | ICD-10-CM | POA: Diagnosis not present

## 2018-03-23 NOTE — Patient Instructions (Signed)

## 2018-03-28 DIAGNOSIS — E1165 Type 2 diabetes mellitus with hyperglycemia: Secondary | ICD-10-CM | POA: Diagnosis not present

## 2018-03-28 DIAGNOSIS — Z6827 Body mass index (BMI) 27.0-27.9, adult: Secondary | ICD-10-CM | POA: Diagnosis not present

## 2018-03-28 DIAGNOSIS — Z713 Dietary counseling and surveillance: Secondary | ICD-10-CM | POA: Diagnosis not present

## 2018-03-28 DIAGNOSIS — I4891 Unspecified atrial fibrillation: Secondary | ICD-10-CM | POA: Diagnosis not present

## 2018-03-28 DIAGNOSIS — Z299 Encounter for prophylactic measures, unspecified: Secondary | ICD-10-CM | POA: Diagnosis not present

## 2018-04-13 DIAGNOSIS — E1165 Type 2 diabetes mellitus with hyperglycemia: Secondary | ICD-10-CM | POA: Diagnosis not present

## 2018-04-13 DIAGNOSIS — I4891 Unspecified atrial fibrillation: Secondary | ICD-10-CM | POA: Diagnosis not present

## 2018-04-13 DIAGNOSIS — Z299 Encounter for prophylactic measures, unspecified: Secondary | ICD-10-CM | POA: Diagnosis not present

## 2018-04-13 DIAGNOSIS — I509 Heart failure, unspecified: Secondary | ICD-10-CM | POA: Diagnosis not present

## 2018-04-13 DIAGNOSIS — Z6827 Body mass index (BMI) 27.0-27.9, adult: Secondary | ICD-10-CM | POA: Diagnosis not present

## 2018-04-25 DIAGNOSIS — I4891 Unspecified atrial fibrillation: Secondary | ICD-10-CM | POA: Diagnosis not present

## 2018-04-25 DIAGNOSIS — Z299 Encounter for prophylactic measures, unspecified: Secondary | ICD-10-CM | POA: Diagnosis not present

## 2018-04-25 DIAGNOSIS — Z6827 Body mass index (BMI) 27.0-27.9, adult: Secondary | ICD-10-CM | POA: Diagnosis not present

## 2018-04-25 DIAGNOSIS — Z713 Dietary counseling and surveillance: Secondary | ICD-10-CM | POA: Diagnosis not present

## 2018-05-02 DIAGNOSIS — E119 Type 2 diabetes mellitus without complications: Secondary | ICD-10-CM | POA: Diagnosis not present

## 2018-05-02 DIAGNOSIS — I4891 Unspecified atrial fibrillation: Secondary | ICD-10-CM | POA: Diagnosis not present

## 2018-05-02 DIAGNOSIS — I10 Essential (primary) hypertension: Secondary | ICD-10-CM | POA: Diagnosis not present

## 2018-05-02 DIAGNOSIS — I2510 Atherosclerotic heart disease of native coronary artery without angina pectoris: Secondary | ICD-10-CM | POA: Diagnosis not present

## 2018-05-23 DIAGNOSIS — I10 Essential (primary) hypertension: Secondary | ICD-10-CM | POA: Diagnosis not present

## 2018-05-23 DIAGNOSIS — Z6827 Body mass index (BMI) 27.0-27.9, adult: Secondary | ICD-10-CM | POA: Diagnosis not present

## 2018-05-23 DIAGNOSIS — I509 Heart failure, unspecified: Secondary | ICD-10-CM | POA: Diagnosis not present

## 2018-05-23 DIAGNOSIS — E1165 Type 2 diabetes mellitus with hyperglycemia: Secondary | ICD-10-CM | POA: Diagnosis not present

## 2018-05-23 DIAGNOSIS — I4891 Unspecified atrial fibrillation: Secondary | ICD-10-CM | POA: Diagnosis not present

## 2018-05-23 DIAGNOSIS — Z299 Encounter for prophylactic measures, unspecified: Secondary | ICD-10-CM | POA: Diagnosis not present

## 2018-05-31 DIAGNOSIS — I2510 Atherosclerotic heart disease of native coronary artery without angina pectoris: Secondary | ICD-10-CM | POA: Diagnosis not present

## 2018-05-31 DIAGNOSIS — I10 Essential (primary) hypertension: Secondary | ICD-10-CM | POA: Diagnosis not present

## 2018-05-31 DIAGNOSIS — E119 Type 2 diabetes mellitus without complications: Secondary | ICD-10-CM | POA: Diagnosis not present

## 2018-05-31 DIAGNOSIS — I4891 Unspecified atrial fibrillation: Secondary | ICD-10-CM | POA: Diagnosis not present

## 2018-06-20 DIAGNOSIS — I10 Essential (primary) hypertension: Secondary | ICD-10-CM | POA: Diagnosis not present

## 2018-06-20 DIAGNOSIS — Z299 Encounter for prophylactic measures, unspecified: Secondary | ICD-10-CM | POA: Diagnosis not present

## 2018-06-20 DIAGNOSIS — I4891 Unspecified atrial fibrillation: Secondary | ICD-10-CM | POA: Diagnosis not present

## 2018-06-20 DIAGNOSIS — N400 Enlarged prostate without lower urinary tract symptoms: Secondary | ICD-10-CM | POA: Diagnosis not present

## 2018-06-20 DIAGNOSIS — E1140 Type 2 diabetes mellitus with diabetic neuropathy, unspecified: Secondary | ICD-10-CM | POA: Diagnosis not present

## 2018-06-20 DIAGNOSIS — Z6827 Body mass index (BMI) 27.0-27.9, adult: Secondary | ICD-10-CM | POA: Diagnosis not present

## 2018-06-20 DIAGNOSIS — E1165 Type 2 diabetes mellitus with hyperglycemia: Secondary | ICD-10-CM | POA: Diagnosis not present

## 2018-07-23 DIAGNOSIS — E039 Hypothyroidism, unspecified: Secondary | ICD-10-CM | POA: Diagnosis not present

## 2018-07-23 DIAGNOSIS — Z6827 Body mass index (BMI) 27.0-27.9, adult: Secondary | ICD-10-CM | POA: Diagnosis not present

## 2018-07-23 DIAGNOSIS — I10 Essential (primary) hypertension: Secondary | ICD-10-CM | POA: Diagnosis not present

## 2018-07-23 DIAGNOSIS — I509 Heart failure, unspecified: Secondary | ICD-10-CM | POA: Diagnosis not present

## 2018-07-23 DIAGNOSIS — I4891 Unspecified atrial fibrillation: Secondary | ICD-10-CM | POA: Diagnosis not present

## 2018-07-23 DIAGNOSIS — E1165 Type 2 diabetes mellitus with hyperglycemia: Secondary | ICD-10-CM | POA: Diagnosis not present

## 2018-07-23 DIAGNOSIS — Z299 Encounter for prophylactic measures, unspecified: Secondary | ICD-10-CM | POA: Diagnosis not present

## 2018-08-14 DIAGNOSIS — I4891 Unspecified atrial fibrillation: Secondary | ICD-10-CM | POA: Diagnosis not present

## 2018-08-14 DIAGNOSIS — E119 Type 2 diabetes mellitus without complications: Secondary | ICD-10-CM | POA: Diagnosis not present

## 2018-08-14 DIAGNOSIS — I10 Essential (primary) hypertension: Secondary | ICD-10-CM | POA: Diagnosis not present

## 2018-08-14 DIAGNOSIS — I2510 Atherosclerotic heart disease of native coronary artery without angina pectoris: Secondary | ICD-10-CM | POA: Diagnosis not present

## 2018-08-20 DIAGNOSIS — E1165 Type 2 diabetes mellitus with hyperglycemia: Secondary | ICD-10-CM | POA: Diagnosis not present

## 2018-08-20 DIAGNOSIS — I509 Heart failure, unspecified: Secondary | ICD-10-CM | POA: Diagnosis not present

## 2018-08-20 DIAGNOSIS — I10 Essential (primary) hypertension: Secondary | ICD-10-CM | POA: Diagnosis not present

## 2018-08-20 DIAGNOSIS — E1142 Type 2 diabetes mellitus with diabetic polyneuropathy: Secondary | ICD-10-CM | POA: Diagnosis not present

## 2018-08-20 DIAGNOSIS — I4891 Unspecified atrial fibrillation: Secondary | ICD-10-CM | POA: Diagnosis not present

## 2018-08-20 DIAGNOSIS — Z6827 Body mass index (BMI) 27.0-27.9, adult: Secondary | ICD-10-CM | POA: Diagnosis not present

## 2018-08-20 DIAGNOSIS — Z299 Encounter for prophylactic measures, unspecified: Secondary | ICD-10-CM | POA: Diagnosis not present

## 2018-09-20 DIAGNOSIS — I4891 Unspecified atrial fibrillation: Secondary | ICD-10-CM | POA: Diagnosis not present

## 2018-09-20 DIAGNOSIS — Z713 Dietary counseling and surveillance: Secondary | ICD-10-CM | POA: Diagnosis not present

## 2018-09-20 DIAGNOSIS — Z6828 Body mass index (BMI) 28.0-28.9, adult: Secondary | ICD-10-CM | POA: Diagnosis not present

## 2018-09-26 DIAGNOSIS — I2510 Atherosclerotic heart disease of native coronary artery without angina pectoris: Secondary | ICD-10-CM | POA: Diagnosis not present

## 2018-09-26 DIAGNOSIS — I10 Essential (primary) hypertension: Secondary | ICD-10-CM | POA: Diagnosis not present

## 2018-09-26 DIAGNOSIS — E119 Type 2 diabetes mellitus without complications: Secondary | ICD-10-CM | POA: Diagnosis not present

## 2018-09-26 DIAGNOSIS — I4891 Unspecified atrial fibrillation: Secondary | ICD-10-CM | POA: Diagnosis not present

## 2018-10-18 DIAGNOSIS — I4891 Unspecified atrial fibrillation: Secondary | ICD-10-CM | POA: Diagnosis not present

## 2018-10-18 DIAGNOSIS — I509 Heart failure, unspecified: Secondary | ICD-10-CM | POA: Diagnosis not present

## 2018-10-18 DIAGNOSIS — Z299 Encounter for prophylactic measures, unspecified: Secondary | ICD-10-CM | POA: Diagnosis not present

## 2018-10-18 DIAGNOSIS — N529 Male erectile dysfunction, unspecified: Secondary | ICD-10-CM | POA: Diagnosis not present

## 2018-10-18 DIAGNOSIS — Z6827 Body mass index (BMI) 27.0-27.9, adult: Secondary | ICD-10-CM | POA: Diagnosis not present

## 2018-10-23 DIAGNOSIS — I2510 Atherosclerotic heart disease of native coronary artery without angina pectoris: Secondary | ICD-10-CM | POA: Diagnosis not present

## 2018-10-23 DIAGNOSIS — E119 Type 2 diabetes mellitus without complications: Secondary | ICD-10-CM | POA: Diagnosis not present

## 2018-10-23 DIAGNOSIS — I4891 Unspecified atrial fibrillation: Secondary | ICD-10-CM | POA: Diagnosis not present

## 2018-10-23 DIAGNOSIS — I10 Essential (primary) hypertension: Secondary | ICD-10-CM | POA: Diagnosis not present

## 2018-10-29 DIAGNOSIS — I10 Essential (primary) hypertension: Secondary | ICD-10-CM | POA: Diagnosis not present

## 2018-10-29 DIAGNOSIS — Z299 Encounter for prophylactic measures, unspecified: Secondary | ICD-10-CM | POA: Diagnosis not present

## 2018-10-29 DIAGNOSIS — E1165 Type 2 diabetes mellitus with hyperglycemia: Secondary | ICD-10-CM | POA: Diagnosis not present

## 2018-10-29 DIAGNOSIS — I509 Heart failure, unspecified: Secondary | ICD-10-CM | POA: Diagnosis not present

## 2018-10-29 DIAGNOSIS — I4891 Unspecified atrial fibrillation: Secondary | ICD-10-CM | POA: Diagnosis not present

## 2018-10-29 DIAGNOSIS — Z6827 Body mass index (BMI) 27.0-27.9, adult: Secondary | ICD-10-CM | POA: Diagnosis not present

## 2018-10-29 DIAGNOSIS — E1142 Type 2 diabetes mellitus with diabetic polyneuropathy: Secondary | ICD-10-CM | POA: Diagnosis not present

## 2018-11-13 DIAGNOSIS — Z299 Encounter for prophylactic measures, unspecified: Secondary | ICD-10-CM | POA: Diagnosis not present

## 2018-11-13 DIAGNOSIS — E349 Endocrine disorder, unspecified: Secondary | ICD-10-CM | POA: Diagnosis not present

## 2018-11-13 DIAGNOSIS — E291 Testicular hypofunction: Secondary | ICD-10-CM | POA: Diagnosis not present

## 2018-11-13 DIAGNOSIS — I10 Essential (primary) hypertension: Secondary | ICD-10-CM | POA: Diagnosis not present

## 2018-11-13 DIAGNOSIS — Z6827 Body mass index (BMI) 27.0-27.9, adult: Secondary | ICD-10-CM | POA: Diagnosis not present

## 2018-11-13 DIAGNOSIS — I4891 Unspecified atrial fibrillation: Secondary | ICD-10-CM | POA: Diagnosis not present

## 2018-11-13 DIAGNOSIS — J069 Acute upper respiratory infection, unspecified: Secondary | ICD-10-CM | POA: Diagnosis not present

## 2018-12-10 DIAGNOSIS — Z299 Encounter for prophylactic measures, unspecified: Secondary | ICD-10-CM | POA: Diagnosis not present

## 2018-12-10 DIAGNOSIS — Z6827 Body mass index (BMI) 27.0-27.9, adult: Secondary | ICD-10-CM | POA: Diagnosis not present

## 2018-12-10 DIAGNOSIS — I4891 Unspecified atrial fibrillation: Secondary | ICD-10-CM | POA: Diagnosis not present

## 2018-12-10 DIAGNOSIS — E349 Endocrine disorder, unspecified: Secondary | ICD-10-CM | POA: Diagnosis not present

## 2018-12-10 DIAGNOSIS — Z23 Encounter for immunization: Secondary | ICD-10-CM | POA: Diagnosis not present

## 2018-12-10 DIAGNOSIS — I10 Essential (primary) hypertension: Secondary | ICD-10-CM | POA: Diagnosis not present

## 2018-12-10 DIAGNOSIS — I509 Heart failure, unspecified: Secondary | ICD-10-CM | POA: Diagnosis not present

## 2019-02-04 DIAGNOSIS — Z299 Encounter for prophylactic measures, unspecified: Secondary | ICD-10-CM | POA: Diagnosis not present

## 2019-02-04 DIAGNOSIS — I4891 Unspecified atrial fibrillation: Secondary | ICD-10-CM | POA: Diagnosis not present

## 2019-02-04 DIAGNOSIS — I509 Heart failure, unspecified: Secondary | ICD-10-CM | POA: Diagnosis not present

## 2019-02-04 DIAGNOSIS — E1165 Type 2 diabetes mellitus with hyperglycemia: Secondary | ICD-10-CM | POA: Diagnosis not present

## 2019-02-04 DIAGNOSIS — E349 Endocrine disorder, unspecified: Secondary | ICD-10-CM | POA: Diagnosis not present

## 2019-03-27 ENCOUNTER — Encounter: Payer: Self-pay | Admitting: Cardiovascular Disease

## 2019-03-28 ENCOUNTER — Telehealth: Payer: Self-pay | Admitting: *Deleted

## 2019-03-28 ENCOUNTER — Encounter: Payer: Self-pay | Admitting: *Deleted

## 2019-03-28 NOTE — Telephone Encounter (Signed)
Yes, that is not an unreasonable request to be seen in person in light of his recent health changes and hospitalization.  Unfortunately, I am not going to be in the office next week.  I would suggest getting the records from [facility] and put him on the [facility] DOD schedule next week.  We have this system set up to handle such issues.

## 2019-03-28 NOTE — Telephone Encounter (Signed)
Patient was currently scheduled to see Dr. Purvis Sheffield on 04/01/2019, but is a Dr. Diona Browner patient.  Per BS scheduling instructions, was going to move to [facility] schedule on 04/03/19.    Patient stated that he recently had been in [facility] 03/21/19 through 03/23/2019.  Dx with acute renal failure & was told to f/u with his cardiologist soon.  Thought he may also be having issue with the Lasix & his kidneys.  No c/o SOB, chest pain, or dizziness currently.  Did see is PMD after hosp visit & is due to see him again this coming up Monday.  Stated that he really felt like he needed to be seen in person as opposed to the video visit.  Notes will be requested from pmd & [facility].  Forward to provider for advice.

## 2019-03-28 NOTE — Telephone Encounter (Signed)
Per Dr. Purvis Sheffield - he is agreeable to seeing patient on Monday, 04/01/2019 at 9:30 in the [place] office.  Patient has been notified & is aware to be here 10-15 minutes prior to appointment.  Reminded to bring all medications and insurance cards.  Notes from [facility] received & placed on Dr. Purvis Sheffield desk.

## 2019-04-01 ENCOUNTER — Ambulatory Visit (INDEPENDENT_AMBULATORY_CARE_PROVIDER_SITE_OTHER): Payer: BLUE CROSS/BLUE SHIELD | Admitting: Cardiovascular Disease

## 2019-04-01 ENCOUNTER — Encounter: Payer: Self-pay | Admitting: Cardiovascular Disease

## 2019-04-01 VITALS — BP 138/74 | HR 72 | Temp 98.3°F | Wt 185.0 lb

## 2019-04-01 DIAGNOSIS — I10 Essential (primary) hypertension: Secondary | ICD-10-CM | POA: Diagnosis not present

## 2019-04-01 DIAGNOSIS — I25708 Atherosclerosis of coronary artery bypass graft(s), unspecified, with other forms of angina pectoris: Secondary | ICD-10-CM

## 2019-04-01 DIAGNOSIS — N183 Chronic kidney disease, stage 3 unspecified: Secondary | ICD-10-CM

## 2019-04-01 DIAGNOSIS — R0602 Shortness of breath: Secondary | ICD-10-CM | POA: Diagnosis not present

## 2019-04-01 DIAGNOSIS — Z9289 Personal history of other medical treatment: Secondary | ICD-10-CM

## 2019-04-01 DIAGNOSIS — I480 Paroxysmal atrial fibrillation: Secondary | ICD-10-CM

## 2019-04-01 DIAGNOSIS — E785 Hyperlipidemia, unspecified: Secondary | ICD-10-CM | POA: Diagnosis not present

## 2019-04-01 DIAGNOSIS — Z952 Presence of prosthetic heart valve: Secondary | ICD-10-CM

## 2019-04-01 DIAGNOSIS — I5032 Chronic diastolic (congestive) heart failure: Secondary | ICD-10-CM

## 2019-04-01 NOTE — Patient Instructions (Signed)
Medication Instructions: Your physician recommends that you continue on your current medications as directed. Please refer to the Current Medication list given to you today.   Labwork: None today  Procedures/Testing: Your physician has requested that you have an echocardiogram. Echocardiography is a painless test that uses sound waves to create images of your heart. It provides your doctor with information about the size and shape of your heart and how well your heart's chambers and valves are working. This procedure takes approximately one hour. There are no restrictions for this procedure.    Follow-Up: 2 months with Dr.McDowell  Any Additional Special Instructions Will Be Listed Below (If Applicable).     If you need a refill on your cardiac medications before your next appointment, please call your pharmacy.      Thank you for choosing [facility] !

## 2019-04-01 NOTE — Progress Notes (Signed)
Primary cardiologist: Dr. Diona BrownerMcDowell  SUBJECTIVE:  I am seeing the patient in the office today at the request of Dr. Diona BrownerMcDowell.  I reviewed his last office note dated 03/23/2018.  Significant cardiac history includes multivessel coronary artery disease status post CABG with redo operation in 2013, aortic stenosis status post mechanical aortic valve replacement in 2013 at the time of redo CABG.  He has a history of atrial fibrillation with previous Cox-Maze procedure.  [facility] was [facility] and presented there on 03/22/2019.  I personally reviewed all relevant documentation, labs, and studies.  He was found to be in acute renal failure with a creatinine of 2.8 and potassium of 6.5.  However labs when checked in the ED showed a creatinine of 2.27 and potassium of 5.4, sodium 130, platelets 204, hemoglobin 15.6, white blood cells 11.4, BUN 77.  He was given some IV hydration.  There was a question of pneumonia and he was given IV Rocephin.  It appears he had "a 3-day history of intermittent dizziness and had a chronic cough for the past 2 to 3 months".  I reviewed the chest x-ray report which showed "pulmonary vascular congestion and left mid and lower lung zone opacity which may represent asymmetric edema or pneumonia ".  I personally reviewed an ECG which demonstrated sinus rhythm with right bundle branch block, first-degree AV block, and question of old inferior infarct.  I also reviewed office notes dated 03/26/2019.  At that visit it was noted he had "bibasilar crackles "and was started on Lasix with plans for follow-up chest x-ray in 4 to 6 weeks.  He was then apparently called back by the office and told only to take Lasix once.  Upon speaking further with the patient, he told me that he was initially told to take 60 mg of Lasix on 4/20, 40 mg on 4/21, and then 20 mg daily.  His PCP ordered some blood work and then the PCPs nurse called him back on 4/21 and told him not  to take Lasix for 2 days and then to resume taking 20 mg daily.  He took 20 mg of Lasix yesterday.  He had a persistent cough yesterday and took several cough drops last night to help him sleep.  He denies orthopnea and paroxysmal nocturnal dyspnea.  He has been dyspneic with exertion.  He denies exertional chest pain.   Review of Systems: As per "subjective", otherwise negative.  No Known Allergies  Current Outpatient Medications  Medication Sig Dispense Refill  . allopurinol (ZYLOPRIM) 300 MG tablet Take 300 mg by mouth daily.    Marland Kitchen. amLODipine (NORVASC) 5 MG tablet Take 1 tablet (5 mg total) by mouth daily. 90 tablet 1  . aspirin 81 MG tablet Take 81 mg by mouth daily.    Marland Kitchen. atorvastatin (LIPITOR) 20 MG tablet Take 1 tablet (20 mg total) by mouth daily. 90 tablet 1  . Canagliflozin (INVOKANA) 300 MG TABS Take 300 mg by mouth daily.    . furosemide (LASIX) 40 MG tablet Take 1 tablet (40 mg total) by mouth daily. 90 tablet 1  . glipiZIDE (GLUCOTROL) 5 MG tablet Take 5 mg by mouth daily before breakfast.    . glyBURIDE-metformin (GLUCOVANCE) 5-500 MG per tablet Take 2 tablets by mouth 2 (two) times daily.    . iron polysaccharides (NIFEREX) 150 MG capsule Take 150 mg by mouth daily.    Marland Kitchen. levothyroxine (SYNTHROID, LEVOTHROID) 75 MCG tablet Take 75 mcg by  mouth daily.    Marland Kitchen lisinopril (PRINIVIL,ZESTRIL) 10 MG tablet Take 10 mg by mouth daily.    . metoprolol tartrate (LOPRESSOR) 25 MG tablet Take 25 mg by mouth daily.     . nitroGLYCERIN (NITROSTAT) 0.4 MG SL tablet Place 0.4 mg under the tongue every 5 (five) minutes as needed. For chest pain    . Tamsulosin HCl (FLOMAX) 0.4 MG CAPS Take 0.4 mg by mouth 1 day or 1 dose.    . warfarin (COUMADIN) 5 MG tablet Take 0.5 tab (2.5 mg) daily or as directed by [facility] 60 tablet 1   No current facility-administered medications for this visit.     Past Medical History:  Diagnosis Date  . Aortic stenosis   . Atrial fibrillation (HCC)     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 February 2013  . Coronary atherosclerosis of native coronary artery    2 vessel s/p CABG initial surgery in 2004, status post redo coronary bypass grafting x 3 2013  . Essential hypertension, benign   . H/O hiatal hernia   . Hypothyroidism   . Mixed hyperlipidemia   . Myocardial infarction (HCC) 1998  . Osteoarthritis   . Pneumonia 2010  . Rotator cuff tear    Right shoulder  . Sleep apnea    wears CPAP nightly  . Type 2 diabetes mellitus (HCC)     Past Surgical History:  Procedure Laterality Date  . AORTIC VALVE REPLACEMENT  01/12/2012   Procedure: AORTIC VALVE REPLACEMENT (AVR);  Surgeon: Purcell Nails, MD;  Location: [facility] OR;  Service: Open Heart Surgery;  Laterality: N/A;  NO NECKLINE ON THE RIGHT  . APPENDECTOMY    . CARDIAC CATHETERIZATION  12/21/2011  . CARDIOVERSION  11/18/2011  . CHOLECYSTECTOMY    . CORONARY ARTERY BYPASS GRAFT  04/02/2003   Dr. Cornelius Moras - SVG to OM, composite SVG/RIMA to PDA  . CORONARY ARTERY BYPASS GRAFT  01/12/2012   Procedure: REDO CORONARY ARTERY BYPASS GRAFTING (CABG);  Surgeon: Purcell Nails, MD;  Location: [facility] OR;  Service: Open Heart Surgery;  Laterality: N/A;  Three grafts; Endoscopically harvested left saphenous vein graft and left internal mammary artery  . LEFT AND RIGHT HEART CATHETERIZATION WITH CORONARY ANGIOGRAM N/A 12/21/2011   Procedure: LEFT AND RIGHT HEART CATHETERIZATION WITH CORONARY ANGIOGRAM;  Surgeon: Iran Ouch, MD;  Location: [facility] CATH LAB;  Service: Cardiovascular;  Laterality: N/A;  . MAZE  01/12/2012   Procedure: MAZE;  Surgeon: Purcell Nails, MD;  Location: [facility] OR;  Service: Open Heart Surgery;  Laterality: N/A;  NO NECKLINE ON THE RIGHT  . TONSILLECTOMY      Social History   Socioeconomic History  . Marital status: Married    Spouse name: Not on file  . Number of children: Not on file  . Years of education: Not on file  . Highest education level: Not on file   Occupational History  . Occupation: Hydrographic surveyor    Comment: Full time  Social Needs  . Financial resource strain: Not on file  . Food insecurity:    Worry: Not on file    Inability: Not on file  . Transportation needs:    Medical: Not on file    Non-medical: Not on file  Tobacco Use  . Smoking status: Former Smoker    Packs/day: 3.00    Years: 14.00    Pack years: 42.00    Types: Cigarettes    Last attempt to quit: 12/05/1982  Years since quitting: 36.3  . Smokeless tobacco: Never Used  Substance and Sexual Activity  . Alcohol use: No  . Drug use: No  . Sexual activity: Not on file  Lifestyle  . Physical activity:    Days per week: Not on file    Minutes per session: Not on file  . Stress: Not on file  Relationships  . Social connections:    Talks on phone: Not on file    Gets together: Not on file    Attends religious service: Not on file    Active member of club or organization: Not on file    Attends meetings of clubs or organizations: Not on file    Relationship status: Not on file  . Intimate partner violence:    Fear of current or ex partner: Not on file    Emotionally abused: Not on file    Physically abused: Not on file    Forced sexual activity: Not on file  Other Topics Concern  . Not on file  Social History Narrative  . Not on file     Vitals:   04/01/19 0922  BP: 138/74  Pulse: 72  Temp: 98.3 F (36.8 C)  SpO2: 98%  Weight: 185 lb (83.9 kg)    Wt Readings from Last 3 Encounters:  04/01/19 185 lb (83.9 kg)  03/23/18 182 lb 6.4 oz (82.7 kg)  11/29/16 190 lb (86.2 kg)     PHYSICAL EXAM General: NAD HEENT: Normal. Neck: No JVD, no thyromegaly. Lungs: Faint bibasilar rales with normal respiratory effort. CV: Regular rate and rhythm, normal S1/prosthetic S2, no S3/S4, soft systolic murmur over right upper sternal border. No pretibial or periankle edema.  No carotid bruit.   Abdomen: Soft, nontender, no distention.   Neurologic: Alert and oriented.  Psych: Normal affect. Skin: Normal. Musculoskeletal: No gross deformities.    ECG: Reviewed above under Subjective   Labs: Lab Results  Component Value Date/Time   K 4.5 01/23/2012 06:29 AM   BUN 21 01/23/2012 06:29 AM   CREATININE 1.05 01/23/2012 06:29 AM   ALT 41 01/09/2012 01:24 PM   HGB 9.7 (L) 01/23/2012 06:29 AM     Lipids: No results found for: LDLCALC, LDLDIRECT, CHOL, TRIG, HDL    Cardiovascular studies:  Echocardiogram 03/02/2016: Study Conclusions  - Left ventricle: The cavity size was normal. Wall thickness was  increased in a pattern of mild LVH. Systolic function was  vigorous. The estimated ejection fraction was in the range of 65%  to 70%. Diastolic function is abnormal, indeterminate grade. Wall  motion was normal; there were no regional wall motion  abnormalities. - Aortic valve: There is a 23 mm [employer]  mechanical prosthetic heart valve in the AV position. Moderately  calcified annulus. Reported normals for valve peak gradient 24  +/- 8 mmHg and mean gradient 13 +/- 4 mmHg. Measured gradients  are on the high end of normal to mildly elevated. Note this is in  the setting of hyperdynamic LV systolic function. There was no  significant regurgitation. Mean gradient (S): 17 mm Hg. Peak  gradient (S): 36 mm Hg. - Left atrium: The atrium was mildly dilated. - Technically adequate study.  Lexiscan Cardiolite 03/14/2016:  There was no ST segment deviation noted during stress.  Findings consistent with prior inferolateral myocardial infarction with mild peri-infarct ischemia.  This is a low risk study. Overall small area of myocardium currently at jeopardy.  The left ventricular ejection fraction is normal (  55-65%).   ASSESSMENT AND PLAN: 1.  Shortness of breath/chronic diastolic heart failure: He had been taking Lasix 40 mg daily but only to 20 mg yesterday.  He is scheduled to see his  PCP later today who did blood work after hospital discharge.  I do not have a copy of this and will have to request it.  He likely needs to resume 40 mg daily.  As he has not had a structural evaluation in some time, I will obtain an echocardiogram to assess for interval changes in cardiac function and regional wall motion, and to assess transvalvular gradients across the prosthetic aortic valve to see if they have increased to any significant degree.  2.  Multivessel coronary artery disease status post CABG with redo operation in 2013: Symptomatically stable from this standpoint and denies anything reminiscent of prior anginal symptoms.  He takes aspirin, atorvastatin 20 mg, and metoprolol tartrate 25 mg daily.  Low risk nuclear stress test in April 2017.  3.  Aortic stenosis status post AVR: He takes warfarin which is managed by his PCP.  Grossly normal valve function by echocardiogram in March 2017.  See discussion in #1.  I will obtain an echocardiogram to assess for interval changes in cardiac function and regional wall motion, and to assess transvalvular gradients across the prosthetic aortic valve to see if they have increased to any significant degree.  4.  History of atrial fibrillation with prior Cox-Maze procedure: Denies palpitations.  Most recent ECG demonstrates sinus rhythm.  5.  Mixed hyperlipidemia: Currently on Lipitor 20 mg.  I will request a copy of most recent lipids from PCP.  Given his history of coronary disease, he should be on high intensity statin therapy if tolerated.  6.  Chronic kidney disease stage III/IV: This is being followed by his PCP.  Interestingly, he is on lisinopril 10 mg daily.  I will obtain a copy of most recent labs from PCP.  7. HTN: Normal. No changes.   Disposition: Follow up 2 months with Dr. Diona Browner  Time spent: 40 minutes, of which greater than 50% was spent reviewing symptoms, relevant blood tests and studies, and discussing management plan with  the patient.   Prentice Docker, M.D., F.A.C.C.

## 2019-04-08 ENCOUNTER — Other Ambulatory Visit: Payer: Self-pay

## 2019-04-08 ENCOUNTER — Ambulatory Visit (HOSPITAL_COMMUNITY)
Admission: RE | Admit: 2019-04-08 | Discharge: 2019-04-08 | Disposition: A | Discharge status: Home / Self Care | Payer: BLUE CROSS/BLUE SHIELD | Source: Ambulatory Visit | Attending: Cardiovascular Disease | Admitting: Cardiovascular Disease

## 2019-04-08 DIAGNOSIS — R0602 Shortness of breath: Secondary | ICD-10-CM | POA: Diagnosis not present

## 2019-04-08 NOTE — Progress Notes (Signed)
*  PRELIMINARY RESULTS* Echocardiogram 2D Echocardiogram has been performed.  Stacey Drain 04/08/2019, 12:04 PM

## 2019-06-14 ENCOUNTER — Ambulatory Visit: Payer: Medicare Other | Admitting: Cardiology

## 2019-08-06 ENCOUNTER — Encounter: Payer: Self-pay | Admitting: Cardiology

## 2019-08-08 ENCOUNTER — Encounter: Payer: Self-pay | Admitting: Cardiology

## 2019-08-19 ENCOUNTER — Ambulatory Visit (INDEPENDENT_AMBULATORY_CARE_PROVIDER_SITE_OTHER): Payer: BC Managed Care – PPO | Admitting: Cardiology

## 2019-08-19 ENCOUNTER — Telehealth: Payer: Self-pay | Admitting: Cardiology

## 2019-08-19 ENCOUNTER — Other Ambulatory Visit: Payer: Self-pay

## 2019-08-19 ENCOUNTER — Encounter: Payer: Self-pay | Admitting: Cardiology

## 2019-08-19 VITALS — BP 115/65 | HR 74 | Ht 68.5 in | Wt 174.4 lb

## 2019-08-19 DIAGNOSIS — N183 Chronic kidney disease, stage 3 unspecified: Secondary | ICD-10-CM

## 2019-08-19 DIAGNOSIS — I480 Paroxysmal atrial fibrillation: Secondary | ICD-10-CM

## 2019-08-19 DIAGNOSIS — I10 Essential (primary) hypertension: Secondary | ICD-10-CM

## 2019-08-19 DIAGNOSIS — I25708 Atherosclerosis of coronary artery bypass graft(s), unspecified, with other forms of angina pectoris: Secondary | ICD-10-CM | POA: Diagnosis not present

## 2019-08-19 NOTE — Telephone Encounter (Signed)
Patient was in [facility] due to thinking he had COVID - he wants to know if he needs to come in today or make virtual

## 2019-08-19 NOTE — Progress Notes (Signed)
.    Cardiology Office Note  Date: 08/19/2019   ID: [name], DOB 1951/04/05, MRN [id number]  PCP:  Ignatius Specking, MD  Cardiologist:  Nona Dell, MD Electrophysiologist:  None   Chief Complaint  Patient presents with  . Cardiac follow-up    History of Present Illness: [name] is a 68 y.o. male seen by Dr. Purvis Sheffield back in April - I reviewed the note.  He presents for a follow-up visit.  I do not have complete information at this time.  He tells me that he has been hospitalized at [facility] on three separate occasions since April, including about two weeks ago.  He reports having trouble with recurrent "dehydration," it sounds like he has had acute renal failure and also symptomatic bradycardia, also reported that he was diagnosed with "pneumonia."  During his most recent evaluation he was tested for COVID-19 twice, both negative.  He is currently on antibiotics per Dr. Sherril Croon.  I note that he has been taken off metoprolol, presumably due to bradycardia, but otherwise as best I can tell remains on Lasix and lisinopril.  His last creatinine was 1.51 in April, we are requesting interval follow-up records.  He states that he feels okay at this time.  Intermittent cough.  No orthopnea or PND.  No leg swelling.  His weight is down about 10 pounds compared to April.  Follow-up echocardiogram from May revealed LVEF 60 to 65% with mild RV dysfunction, stable aortic prosthetic gradients.  I discussed the results with him.  He continues on Coumadin with follow-up by Dr. Sherril Croon.  He does not report any bleeding problems.  Past Medical History:  Diagnosis Date  . Aortic stenosis   . Atrial fibrillation (HCC)    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 February 2013  . Coronary atherosclerosis of native coronary artery    2 vessel s/p CABG initial surgery in 2004, status post redo coronary bypass grafting x 3 2013  . Essential hypertension   . H/O hiatal hernia    . Hypothyroidism   . Mixed hyperlipidemia   . Myocardial infarction (HCC) 1998  . Osteoarthritis   . Pneumonia 2010  . Rotator cuff tear    Right shoulder  . Sleep apnea    wears CPAP nightly  . Type 2 diabetes mellitus (HCC)     Past Surgical History:  Procedure Laterality Date  . AORTIC VALVE REPLACEMENT  01/12/2012   Procedure: AORTIC VALVE REPLACEMENT (AVR);  Surgeon: Purcell Nails, MD;  Location: [facility] OR;  Service: Open Heart Surgery;  Laterality: N/A;  NO NECKLINE ON THE RIGHT  . APPENDECTOMY    . CARDIAC CATHETERIZATION  12/21/2011  . CARDIOVERSION  11/18/2011  . CHOLECYSTECTOMY    . CORONARY ARTERY BYPASS GRAFT  04/02/2003   Dr. Cornelius Moras - SVG to OM, composite SVG/RIMA to PDA  . CORONARY ARTERY BYPASS GRAFT  01/12/2012   Procedure: REDO CORONARY ARTERY BYPASS GRAFTING (CABG);  Surgeon: Purcell Nails, MD;  Location: [facility] OR;  Service: Open Heart Surgery;  Laterality: N/A;  Three grafts; Endoscopically harvested left saphenous vein graft and left internal mammary artery  . LEFT AND RIGHT HEART CATHETERIZATION WITH CORONARY ANGIOGRAM N/A 12/21/2011   Procedure: LEFT AND RIGHT HEART CATHETERIZATION WITH CORONARY ANGIOGRAM;  Surgeon: Iran Ouch, MD;  Location: [facility] CATH LAB;  Service: Cardiovascular;  Laterality: N/A;  . MAZE  01/12/2012   Procedure: MAZE;  Surgeon: Purcell Nails, MD;  Location: [facility] OR;  Service: Open Heart Surgery;  Laterality: N/A;  NO NECKLINE ON THE RIGHT  . TONSILLECTOMY      Current Outpatient Medications  Medication Sig Dispense Refill  . allopurinol (ZYLOPRIM) 300 MG tablet Take 300 mg by mouth daily.    Marland Kitchen. amLODipine (NORVASC) 5 MG tablet Take 1 tablet (5 mg total) by mouth daily. 90 tablet 1  . aspirin 81 MG tablet Take 81 mg by mouth daily.    Marland Kitchen. atorvastatin (LIPITOR) 20 MG tablet Take 1 tablet (20 mg total) by mouth daily. 90 tablet 1  . furosemide (LASIX) 40 MG tablet Take 1 tablet (40 mg total) by mouth daily. 90 tablet 1  . glipiZIDE (GLUCOTROL) 5 MG  tablet Take 5 mg by mouth daily before breakfast.    . iron polysaccharides (NIFEREX) 150 MG capsule Take 150 mg by mouth daily.    Marland Kitchen. levothyroxine (SYNTHROID, LEVOTHROID) 75 MCG tablet Take 75 mcg by mouth daily.    Marland Kitchen. lisinopril (PRINIVIL,ZESTRIL) 10 MG tablet Take 10 mg by mouth daily.    . nitroGLYCERIN (NITROSTAT) 0.4 MG SL tablet Place 0.4 mg under the tongue every 5 (five) minutes as needed. For chest pain    . Tamsulosin HCl (FLOMAX) 0.4 MG CAPS Take 0.4 mg by mouth daily.     Marland Kitchen. warfarin (COUMADIN) 5 MG tablet Take 0.5 tab (2.5 mg) daily or as directed by [facility] (Patient taking differently: Take 0.5 tab (2.5 mg) daily or as directed by pmd) 60 tablet 1   No current facility-administered medications for this visit.    Allergies:  Patient has no known allergies.   Social History: The patient  reports that he quit smoking about 36 years ago. His smoking use included cigarettes. He has a 42.00 pack-year smoking history. He has never used smokeless tobacco. He reports that he does not drink alcohol or use drugs.   ROS:  Please see the history of present illness. Otherwise, complete review of systems is positive for memory problems.  All other systems are reviewed and negative.   Physical Exam: VS:  BP 115/65   Pulse 74   Ht 5' 8.5" (1.74 m)   Wt 174 lb 6.4 oz (79.1 kg)   SpO2 97%   BMI 26.13 kg/m , BMI Body mass index is 26.13 kg/m.  Wt Readings from Last 3 Encounters:  08/19/19 174 lb 6.4 oz (79.1 kg)  04/01/19 185 lb (83.9 kg)  03/23/18 182 lb 6.4 oz (82.7 kg)    General: Patient appears comfortable at rest. HEENT: Conjunctiva and lids normal, wearing a mask. Neck: Supple, no elevated JVP or carotid bruits, no thyromegaly. Lungs: Coarse breath sounds without wheezing, nonlabored breathing at rest. Cardiac: Irregularly irregular, no S3, crisp prosthetic click and S2 with soft systolic murmur, no pericardial rub. Abdomen: Soft, nontender, bowel sounds present.  Extremities: No pitting edema, distal pulses 2+. Skin: Warm and dry. Musculoskeletal: No kyphosis. Neuropsychiatric: Alert and oriented x3, affect grossly appropriate.  ECG:  An ECG dated 03/21/2019 was personally reviewed today and demonstrated:  Sinus rhythm with prolonged PR interval and right bundle branch block.  Recent Labwork:  April 2020: BUN 29, creatinine 1.51, potassium 5.7, TSH 2.23, cholesterol 143, triglycerides 203, HDL 30, LDL 72, hemoglobin 15.7, platelets 217  Other Studies Reviewed Today:  Echocardiogram 04/08/2019:  1. The left ventricle has normal systolic function with an ejection fraction of 60-65%. The cavity size was normal. There is mildly increased left ventricular wall thickness. Left ventricular diastolic Doppler parameters  are indeterminate.  2. The right ventricle has mildly reduced systolic function. The cavity was mildly enlarged. There is no increase in right ventricular wall thickness.  3. The aortic valve was not well visualized.  4. AV prosthesis is well seated Peak and mean gradients through the valve are 41 and 19 mm Hg respectively. SInce previous echo, gradients a little lower.  5. The aortic root is normal in size and structure.  6. The interatrial septum was not assessed.  Assessment and Plan:  1.  Multivessel CAD status post CABG with redo operation in 2013.  LVEF 60 to 65% by follow-up echocardiogram in May.  He does not report any active angina at this time and remains on aspirin and statin therapy.  2.  Aortic stenosis status post mechanical AVR in 2013.  Echocardiogram in May showed stable prosthetic gradients.  He remains on Coumadin with follow-up by Dr. Woody Seller.  3.  Persistent atrial fibrillation with previous Cox-Maze procedure.  He reports no palpitations and heart rate is normal today.  Requesting interval records from [facility]  4.  Reported episodes of acute on chronic renal failure, details are not clear.  Records have been  requested.  It sounds like he was seen by nephrology and cardiology while at [facility]  He is no longer on beta-blocker or metformin, but presumably continues on both Lasix and ACE inhibitor.  5.  Mixed hyperlipidemia on Lipitor.  Medication Adjustments/Labs and Tests Ordered: Current medicines are reviewed at length with the patient today.  Concerns regarding medicines are outlined above.   Tests Ordered: No orders of the defined types were placed in this encounter.   Medication Changes: No orders of the defined types were placed in this encounter.   Disposition: Plan to review records and then determine disposition.  Signed, Satira Sark, MD, [facility] 08/19/2019 4:52 PM    [facility] [place], [place],  [address] Phone: [phone]; Fax: [phone]

## 2019-08-19 NOTE — Patient Instructions (Signed)
Medication Instructions:  Continue all current medications.  Labwork: none  Testing/Procedures: none  Follow-Up: Pending   Any Other Special Instructions Will Be Listed Below (If Applicable).   If you need a refill on your cardiac medications before your next appointment, please call your pharmacy.  

## 2019-08-19 NOTE — Telephone Encounter (Signed)
Patient states that his covid test was negative & prefers to keep the "in office" visit as is for today.

## 2019-08-22 ENCOUNTER — Telehealth: Payer: Self-pay | Admitting: *Deleted

## 2019-08-22 ENCOUNTER — Encounter: Payer: Self-pay | Admitting: *Deleted

## 2019-08-22 MED ORDER — TOUJEO SOLOSTAR 300 UNIT/ML ~~LOC~~ SOPN: 10.0000 [IU] | PEN_INJECTOR | Freq: Every day | SUBCUTANEOUS | Status: AC

## 2019-08-22 MED ORDER — ALBUTEROL SULFATE HFA 108 (90 BASE) MCG/ACT IN AERS: 1.0000 | INHALATION_SPRAY | RESPIRATORY_TRACT | Status: AC | PRN

## 2019-08-22 MED ORDER — LINAGLIPTIN 5 MG PO TABS: 5.0000 mg | ORAL_TABLET | Freq: Every day | ORAL | Status: AC

## 2019-08-22 MED ORDER — GLIPIZIDE 10 MG PO TABS: 10.0000 mg | ORAL_TABLET | Freq: Two times a day (BID) | ORAL | Status: AC

## 2019-08-22 NOTE — Telephone Encounter (Signed)
Thank you.  I also need to review his records from [facility] when available.

## 2019-08-22 NOTE — Telephone Encounter (Signed)
Noted, notes have been requested already.

## 2019-08-22 NOTE — Telephone Encounter (Signed)
Call placed to patient to review medications - patient read each bottle off that he had at home.  Medication list updated.

## 2019-08-23 ENCOUNTER — Telehealth: Payer: Self-pay | Admitting: Cardiology

## 2019-08-23 NOTE — Telephone Encounter (Signed)
[name] called stating that he had a "Virtual" visit today with Dr. Woody Seller. He states that Dr. Woody Seller told him to call the office asking Dr. [facility] to contact him in regards to performing a procedure on patient.

## 2019-08-26 NOTE — Telephone Encounter (Signed)
Patient says that Dr. Woody Seller office is requesting a call about a procedure, per patient " a light run down in me". Faxed request to [facility] office for any questions they have relating to a procedure for this patient.

## 2019-08-28 ENCOUNTER — Encounter (HOSPITAL_COMMUNITY): Payer: Self-pay

## 2019-08-28 ENCOUNTER — Inpatient Hospital Stay (HOSPITAL_COMMUNITY)
Admission: EM | Admit: 2019-08-28 | Discharge: 2019-09-05 | DRG: 871 | Disposition: E | Payer: BC Managed Care – PPO | Attending: Internal Medicine | Admitting: Internal Medicine

## 2019-08-28 ENCOUNTER — Other Ambulatory Visit: Payer: Self-pay

## 2019-08-28 ENCOUNTER — Emergency Department (HOSPITAL_COMMUNITY): Payer: BC Managed Care – PPO

## 2019-08-28 DIAGNOSIS — I330 Acute and subacute infective endocarditis: Secondary | ICD-10-CM | POA: Diagnosis present

## 2019-08-28 DIAGNOSIS — R0603 Acute respiratory distress: Secondary | ICD-10-CM | POA: Diagnosis present

## 2019-08-28 DIAGNOSIS — N183 Chronic kidney disease, stage 3 (moderate): Secondary | ICD-10-CM | POA: Diagnosis present

## 2019-08-28 DIAGNOSIS — I350 Nonrheumatic aortic (valve) stenosis: Secondary | ICD-10-CM | POA: Diagnosis present

## 2019-08-28 DIAGNOSIS — I509 Heart failure, unspecified: Secondary | ICD-10-CM

## 2019-08-28 DIAGNOSIS — A4181 Sepsis due to Enterococcus: Principal | ICD-10-CM | POA: Diagnosis present

## 2019-08-28 DIAGNOSIS — I493 Ventricular premature depolarization: Secondary | ICD-10-CM

## 2019-08-28 DIAGNOSIS — I1310 Hypertensive heart and chronic kidney disease without heart failure, with stage 1 through stage 4 chronic kidney disease, or unspecified chronic kidney disease: Secondary | ICD-10-CM | POA: Diagnosis present

## 2019-08-28 DIAGNOSIS — J9690 Respiratory failure, unspecified, unspecified whether with hypoxia or hypercapnia: Secondary | ICD-10-CM

## 2019-08-28 DIAGNOSIS — K047 Periapical abscess without sinus: Secondary | ICD-10-CM | POA: Diagnosis present

## 2019-08-28 DIAGNOSIS — E782 Mixed hyperlipidemia: Secondary | ICD-10-CM | POA: Diagnosis present

## 2019-08-28 DIAGNOSIS — Z7901 Long term (current) use of anticoagulants: Secondary | ICD-10-CM

## 2019-08-28 DIAGNOSIS — Z952 Presence of prosthetic heart valve: Secondary | ICD-10-CM

## 2019-08-28 DIAGNOSIS — E039 Hypothyroidism, unspecified: Secondary | ICD-10-CM | POA: Diagnosis present

## 2019-08-28 DIAGNOSIS — Z79890 Hormone replacement therapy (postmenopausal): Secondary | ICD-10-CM

## 2019-08-28 DIAGNOSIS — R7881 Bacteremia: Secondary | ICD-10-CM

## 2019-08-28 DIAGNOSIS — I452 Bifascicular block: Secondary | ICD-10-CM | POA: Diagnosis present

## 2019-08-28 DIAGNOSIS — Z951 Presence of aortocoronary bypass graft: Secondary | ICD-10-CM

## 2019-08-28 DIAGNOSIS — I76 Septic arterial embolism: Secondary | ICD-10-CM | POA: Diagnosis present

## 2019-08-28 DIAGNOSIS — J189 Pneumonia, unspecified organism: Secondary | ICD-10-CM

## 2019-08-28 DIAGNOSIS — G4733 Obstructive sleep apnea (adult) (pediatric): Secondary | ICD-10-CM | POA: Diagnosis present

## 2019-08-28 DIAGNOSIS — I2690 Septic pulmonary embolism without acute cor pulmonale: Secondary | ICD-10-CM | POA: Diagnosis present

## 2019-08-28 DIAGNOSIS — T380X5A Adverse effect of glucocorticoids and synthetic analogues, initial encounter: Secondary | ICD-10-CM | POA: Diagnosis not present

## 2019-08-28 DIAGNOSIS — Z4659 Encounter for fitting and adjustment of other gastrointestinal appliance and device: Secondary | ICD-10-CM

## 2019-08-28 DIAGNOSIS — J9601 Acute respiratory failure with hypoxia: Secondary | ICD-10-CM

## 2019-08-28 DIAGNOSIS — Z515 Encounter for palliative care: Secondary | ICD-10-CM | POA: Diagnosis present

## 2019-08-28 DIAGNOSIS — Z87891 Personal history of nicotine dependence: Secondary | ICD-10-CM

## 2019-08-28 DIAGNOSIS — B952 Enterococcus as the cause of diseases classified elsewhere: Secondary | ICD-10-CM | POA: Diagnosis not present

## 2019-08-28 DIAGNOSIS — Z794 Long term (current) use of insulin: Secondary | ICD-10-CM

## 2019-08-28 DIAGNOSIS — Z66 Do not resuscitate: Secondary | ICD-10-CM | POA: Diagnosis present

## 2019-08-28 DIAGNOSIS — I371 Nonrheumatic pulmonary valve insufficiency: Secondary | ICD-10-CM | POA: Diagnosis not present

## 2019-08-28 DIAGNOSIS — I5033 Acute on chronic diastolic (congestive) heart failure: Secondary | ICD-10-CM | POA: Diagnosis present

## 2019-08-28 DIAGNOSIS — E1165 Type 2 diabetes mellitus with hyperglycemia: Secondary | ICD-10-CM | POA: Diagnosis present

## 2019-08-28 DIAGNOSIS — Z79899 Other long term (current) drug therapy: Secondary | ICD-10-CM

## 2019-08-28 DIAGNOSIS — Z20828 Contact with and (suspected) exposure to other viral communicable diseases: Secondary | ICD-10-CM | POA: Diagnosis present

## 2019-08-28 DIAGNOSIS — I2510 Atherosclerotic heart disease of native coronary artery without angina pectoris: Secondary | ICD-10-CM | POA: Diagnosis present

## 2019-08-28 DIAGNOSIS — Z7982 Long term (current) use of aspirin: Secondary | ICD-10-CM

## 2019-08-28 DIAGNOSIS — I482 Chronic atrial fibrillation, unspecified: Secondary | ICD-10-CM | POA: Diagnosis present

## 2019-08-28 DIAGNOSIS — I079 Rheumatic tricuspid valve disease, unspecified: Secondary | ICD-10-CM | POA: Diagnosis present

## 2019-08-28 DIAGNOSIS — I351 Nonrheumatic aortic (valve) insufficiency: Secondary | ICD-10-CM | POA: Diagnosis not present

## 2019-08-28 DIAGNOSIS — R791 Abnormal coagulation profile: Secondary | ICD-10-CM

## 2019-08-28 DIAGNOSIS — R6521 Severe sepsis with septic shock: Secondary | ICD-10-CM | POA: Diagnosis present

## 2019-08-28 DIAGNOSIS — R570 Cardiogenic shock: Secondary | ICD-10-CM | POA: Diagnosis not present

## 2019-08-28 DIAGNOSIS — I361 Nonrheumatic tricuspid (valve) insufficiency: Secondary | ICD-10-CM | POA: Diagnosis not present

## 2019-08-28 DIAGNOSIS — R0902 Hypoxemia: Secondary | ICD-10-CM | POA: Diagnosis not present

## 2019-08-28 DIAGNOSIS — E119 Type 2 diabetes mellitus without complications: Secondary | ICD-10-CM | POA: Diagnosis not present

## 2019-08-28 DIAGNOSIS — Z452 Encounter for adjustment and management of vascular access device: Secondary | ICD-10-CM

## 2019-08-28 DIAGNOSIS — E871 Hypo-osmolality and hyponatremia: Secondary | ICD-10-CM | POA: Diagnosis not present

## 2019-08-28 DIAGNOSIS — T826XXA Infection and inflammatory reaction due to cardiac valve prosthesis, initial encounter: Secondary | ICD-10-CM | POA: Diagnosis not present

## 2019-08-28 DIAGNOSIS — T45515A Adverse effect of anticoagulants, initial encounter: Secondary | ICD-10-CM | POA: Diagnosis present

## 2019-08-28 DIAGNOSIS — N179 Acute kidney failure, unspecified: Secondary | ICD-10-CM | POA: Diagnosis present

## 2019-08-28 DIAGNOSIS — A419 Sepsis, unspecified organism: Secondary | ICD-10-CM | POA: Diagnosis not present

## 2019-08-28 DIAGNOSIS — I252 Old myocardial infarction: Secondary | ICD-10-CM

## 2019-08-28 DIAGNOSIS — E875 Hyperkalemia: Secondary | ICD-10-CM | POA: Diagnosis not present

## 2019-08-28 DIAGNOSIS — E1122 Type 2 diabetes mellitus with diabetic chronic kidney disease: Secondary | ICD-10-CM | POA: Diagnosis present

## 2019-08-28 DIAGNOSIS — I4891 Unspecified atrial fibrillation: Secondary | ICD-10-CM | POA: Diagnosis not present

## 2019-08-28 DIAGNOSIS — I340 Nonrheumatic mitral (valve) insufficiency: Secondary | ICD-10-CM | POA: Diagnosis not present

## 2019-08-28 DIAGNOSIS — R011 Cardiac murmur, unspecified: Secondary | ICD-10-CM | POA: Diagnosis not present

## 2019-08-28 DIAGNOSIS — G4730 Sleep apnea, unspecified: Secondary | ICD-10-CM | POA: Diagnosis not present

## 2019-08-28 DIAGNOSIS — D631 Anemia in chronic kidney disease: Secondary | ICD-10-CM | POA: Diagnosis present

## 2019-08-28 DIAGNOSIS — K7290 Hepatic failure, unspecified without coma: Secondary | ICD-10-CM | POA: Diagnosis present

## 2019-08-28 DIAGNOSIS — Z9119 Patient's noncompliance with other medical treatment and regimen: Secondary | ICD-10-CM

## 2019-08-28 LAB — BASIC METABOLIC PANEL
Anion gap: 13 (ref 5–15)
BUN: 34 mg/dL — ABNORMAL HIGH (ref 8–23)
CO2: 24 mmol/L (ref 22–32)
Calcium: 8.6 mg/dL — ABNORMAL LOW (ref 8.9–10.3)
Chloride: 96 mmol/L — ABNORMAL LOW (ref 98–111)
Creatinine, Ser: 1.97 mg/dL — ABNORMAL HIGH (ref 0.61–1.24)
GFR calc Af Amer: 39 mL/min — ABNORMAL LOW (ref >60)
GFR calc non Af Amer: 34 mL/min — ABNORMAL LOW (ref >60)
Glucose, Bld: 229 mg/dL — ABNORMAL HIGH (ref 70–99)
Potassium: 4 mmol/L (ref 3.5–5.1)
Sodium: 133 mmol/L — ABNORMAL LOW (ref 135–145)

## 2019-08-28 LAB — PROTIME-INR
INR: 5 (ref 0.8–1.2)
Prothrombin Time: 45.5 seconds — ABNORMAL HIGH (ref 11.4–15.2)

## 2019-08-28 LAB — MAGNESIUM: Magnesium: 2.5 mg/dL — ABNORMAL HIGH (ref 1.7–2.4)

## 2019-08-28 LAB — CBC
HCT: 32.3 % — ABNORMAL LOW (ref 39.0–52.0)
Hemoglobin: 10.6 g/dL — ABNORMAL LOW (ref 13.0–17.0)
MCH: 30.7 pg (ref 26.0–34.0)
MCHC: 32.8 g/dL (ref 30.0–36.0)
MCV: 93.6 fL (ref 80.0–100.0)
Platelets: 307 10*3/uL (ref 150–400)
RBC: 3.45 MIL/uL — ABNORMAL LOW (ref 4.22–5.81)
RDW: 13.4 % (ref 11.5–15.5)
WBC: 16.3 10*3/uL — ABNORMAL HIGH (ref 4.0–10.5)
nRBC: 0 % (ref 0.0–0.2)

## 2019-08-28 LAB — TROPONIN I (HIGH SENSITIVITY)
Troponin I (High Sensitivity): 103 ng/L (ref <18)
Troponin I (High Sensitivity): 91 ng/L — ABNORMAL HIGH (ref <18)

## 2019-08-28 LAB — LACTIC ACID, PLASMA: Lactic Acid, Venous: 1.3 mmol/L (ref 0.5–1.9)

## 2019-08-28 LAB — BRAIN NATRIURETIC PEPTIDE: B Natriuretic Peptide: 389.1 pg/mL — ABNORMAL HIGH (ref 0.0–100.0)

## 2019-08-28 MED ORDER — FUROSEMIDE 10 MG/ML IJ SOLN
20.0000 mg | Freq: Once | INTRAMUSCULAR | Status: AC
  Administered 2019-08-29: 03:00:00 20 mg via INTRAVENOUS
  Filled 2019-08-28: qty 2

## 2019-08-28 MED ORDER — MAGNESIUM SULFATE IN D5W 1-5 GM/100ML-% IV SOLN
1.0000 g | Freq: Once | INTRAVENOUS | Status: AC
  Administered 2019-08-29: 1 g via INTRAVENOUS
  Filled 2019-08-28: qty 100

## 2019-08-28 MED ORDER — SODIUM CHLORIDE 0.9 % IV SOLN
2.0000 g | INTRAVENOUS | Status: DC
  Administered 2019-08-28: 2 g via INTRAVENOUS
  Filled 2019-08-28 (×2): qty 20

## 2019-08-28 MED ORDER — MAGNESIUM SULFATE 50 % IJ SOLN: 1.0000 g | Freq: Once | INTRAMUSCULAR | Status: DC

## 2019-08-28 NOTE — H&P (Signed)
History and Physical    [name]:[id number] DOB: 15-Mar-1951 DOA: September 07, 2019  PCP: Glenda Chroman, MD  Patient coming from: Home and lives with his wife and mother-in-law  I have personally briefly reviewed patient's old medical records in [place]  Chief Complaint: Increasing shortness of breath  HPI: [name] is a 68 y.o. male with medical history significant of CAD status post CABG, aortic stenosis status post AVR on Coumadin, atrial fibrillation status post maze procedure, OSA noncompliant with CPAP who presented with worsening shortness of breath.  Patient reports that he recently had several hospitalization at [facility] since April. States that he had acute kidney injury as well as pneumonia.  Patient feels that he never quite recovered from these admissions and reports continuous worsening shortness of breath.  He also notes elevated temperature and sometimes has temperature up to 100.4 usually at night.  He also noticed that for 2 to 3 days a week he wakes up with all his clothes and pillow case wet.  Also endorsed new dry cough.  Denies current tobacco use and has quit over 30 years ago. Notes 10 lbs weight loss but thinks likely due to decrease appetite.  He denies any chest pain or palpitations.  Denies any nausea or vomiting but endorsed some lower abdominal dull pain that he has had on and off.  Denies it being associated with food.  States he has had some constipation but had his last bowel movement today.  Denies any dysuria, increased frequency or urgency.  Denies any sick contact at home or at work.  Patient was recently placed on augmentin prophylaxis in preparation for a dental procedure.  He denies any pain around his dental cavities.  ED Course:  He was afebrile and normotensive initially on room air.  During my evaluation, patient had a coughing episode and had desaturation down to 86% with visible respiratory distress and had to be placed on 3 L via  nasal cannula.  CBC showed leukocytosis of 16.3 with hemoglobin 10.6 with a baseline around 9.5. INR of 5. BMP with sodium 133 and mildly elevated creatinine of 1.97 from a baseline of 1.  Troponin of 103 and 91.  BNP of 389. COVID testing pending.  Chest x-ray showed vascular congestion with diffuse bilateral interstitial markings likely representing mild pulmonary edema.  EKG showed atrial fibrillation with frequent PVCs bundle branch block with ST depression in inferior leads seen similar in past EKGs. ED PA consulted cardiology who recommended echocardiogram in the morning and has low suspicion for endocarditis  Review of Systems:  Constitutional: + Weight Change, No Fever ENT/Mouth: No sore throat, No Rhinorrhea Eyes: No Eye Pain, No Vision Changes Cardiovascular: No Chest Pain, + SOB Respiratory: + Cough, No Sputum, No Wheezing, Dyspnea  Gastrointestinal: No Nausea, No Vomiting, No Diarrhea, No Constipation, No Pain Genitourinary: no Urinary Incontinence, No Urgency, No Flank Pain Musculoskeletal: No Arthralgias, No Myalgias Skin: No Skin Lesions, No Pruritus, Neuro: no Weakness, No Numbness,  No Loss of Consciousness, No Syncope Psych: No Anxiety/Panic, No Depression, decrease appetite Heme/Lymph: No Bruising, No Bleeding  Past Medical History:  Diagnosis Date   Aortic stenosis    Atrial fibrillation ([place])    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 February 2013   Coronary atherosclerosis of native coronary artery    2 vessel s/p CABG initial surgery in 2004, status post redo coronary bypass grafting x 3 2013   Essential hypertension    H/O  hiatal hernia    Hypothyroidism    Mixed hyperlipidemia    Myocardial infarction (HCC) 1998   Osteoarthritis    Pneumonia 2010   Rotator cuff tear    Right shoulder   Sleep apnea    wears CPAP nightly   Type 2 diabetes mellitus [facility])     Past Surgical History:  Procedure Laterality Date   AORTIC VALVE  REPLACEMENT  01/12/2012   Procedure: AORTIC VALVE REPLACEMENT (AVR);  Surgeon: Purcell Nails, MD;  Location: [facility] OR;  Service: Open Heart Surgery;  Laterality: N/A;  NO NECKLINE ON THE RIGHT   APPENDECTOMY     CARDIAC CATHETERIZATION  12/21/2011   CARDIOVERSION  11/18/2011   CHOLECYSTECTOMY     CORONARY ARTERY BYPASS GRAFT  04/02/2003   Dr. Cornelius Moras - SVG to OM, composite SVG/RIMA to PDA   CORONARY ARTERY BYPASS GRAFT  01/12/2012   Procedure: REDO CORONARY ARTERY BYPASS GRAFTING (CABG);  Surgeon: Purcell Nails, MD;  Location: [facility] OR;  Service: Open Heart Surgery;  Laterality: N/A;  Three grafts; Endoscopically harvested left saphenous vein graft and left internal mammary artery   LEFT AND RIGHT HEART CATHETERIZATION WITH CORONARY ANGIOGRAM N/A 12/21/2011   Procedure: LEFT AND RIGHT HEART CATHETERIZATION WITH CORONARY ANGIOGRAM;  Surgeon: Iran Ouch, MD;  Location: [facility] CATH LAB;  Service: Cardiovascular;  Laterality: N/A;   MAZE  01/12/2012   Procedure: MAZE;  Surgeon: Purcell Nails, MD;  Location: [facility] OR;  Service: Open Heart Surgery;  Laterality: N/A;  NO NECKLINE ON THE RIGHT   TONSILLECTOMY       reports that he quit smoking about 36 years ago. His smoking use included cigarettes. He has a 42.00 pack-year smoking history. He has never used smokeless tobacco. He reports that he does not drink alcohol or use drugs.  No Known Allergies  Family History  Problem Relation Age of Onset   Cancer Father     Family history reviewed and not pertinent   Prior to Admission medications   Medication Sig Start Date End Date Taking? Authorizing Provider  albuterol (PROAIR HFA) 108 (90 Base) MCG/ACT inhaler Inhale 1-2 puffs into the lungs as needed for wheezing or shortness of breath. 08/22/19   Jonelle Sidle, MD  allopurinol (ZYLOPRIM) 300 MG tablet Take 300 mg by mouth daily.    [provider]  amLODipine (NORVASC) 5 MG tablet Take 1 tablet (5 mg total) by mouth daily. 10/22/12    Jonelle Sidle, MD  amoxicillin-clavulanate (AUGMENTIN) 500-125 MG tablet Take 1 tablet by mouth 2 (two) times daily. 08/23/19   [provider]  aspirin 81 MG tablet Take 81 mg by mouth daily.    [provider]  atorvastatin (LIPITOR) 20 MG tablet Take 1 tablet (20 mg total) by mouth daily. 10/22/12   Jonelle Sidle, MD  furosemide (LASIX) 40 MG tablet Take 1 tablet (40 mg total) by mouth daily. 10/22/12   Jonelle Sidle, MD  glipiZIDE (GLUCOTROL) 10 MG tablet Take 1 tablet (10 mg total) by mouth 2 (two) times daily before a meal. 08/22/19   Jonelle Sidle, MD  Insulin Glargine, 1 Unit Dial, (TOUJEO SOLOSTAR) 300 UNIT/ML SOPN Inject 10 Units into the skin daily. 08/22/19   Jonelle Sidle, MD  iron polysaccharides (NIFEREX) 150 MG capsule Take 150 mg by mouth daily.    [provider]  levothyroxine (SYNTHROID, LEVOTHROID) 75 MCG tablet Take 75 mcg by mouth daily.    [provider]  linagliptin (TRADJENTA) 5 MG TABS tablet Take 1 tablet (5 mg total) by mouth daily. 08/22/19   Jonelle Sidle, MD  nitroGLYCERIN (NITROSTAT) 0.4 MG SL tablet Place 0.4 mg under the tongue every 5 (five) minutes as needed. For chest pain    [provider]  Tamsulosin HCl (FLOMAX) 0.4 MG CAPS Take 0.4 mg by mouth daily.     [provider]  warfarin (COUMADIN) 5 MG tablet Take 0.5 tab (2.5 mg) daily or as directed by [facility] Patient taking differently: Take 0.5 tab (2.5 mg) daily or as directed by pmd 01/16/12   Wilmon Pali, PA-C    Physical Exam: Vitals:   08/08/2019 2200 08/16/2019 2202 09/04/2019 2215 09/04/2019 2230  BP: (!) 140/58  (!) 138/48 (!) 140/50  Pulse:      Resp: (!) 22  (!) 32 (!) 28  Temp:      TempSrc:      SpO2:  (!) 87%      Constitutional: thin elderly male who appears anxious and was initially in respiratory distress on room air prior to being placed on oxygen supplementation Vitals:   08/29/2019 2200 08/08/2019  2202 08/29/2019 2215 08/19/2019 2230  BP: (!) 140/58  (!) 138/48 (!) 140/50  Pulse:      Resp: (!) 22  (!) 32 (!) 28  Temp:      TempSrc:      SpO2:  (!) 87%     Eyes: PERRL, lids and conjunctivae normal ENMT: Mucous membranes are moist. Posterior pharynx clear of any exudate or lesions.Normal dentition.  Neck: normal, supple, no masses Respiratory: clear to auscultation bilaterally, no wheezing, no crackles.  Respiratory distress on room air with oxygen saturation of 86% with improvement up to 96% on 3 L via nasal cannula.  Visibly short of breath when speaking.   Cardiovascular: Regular rate and rhythm, 3/6 systolic murmur. No extremity edema.  Abdomen: no tenderness, no masses palpated. No hepatosplenomegaly. Bowel sounds positive.  Musculoskeletal: no clubbing / cyanosis. No joint deformity upper and lower extremities. Good ROM, no contractures. Normal muscle tone.  Skin: no rashes, lesions, ulcers. No induration Neurologic: CN 2-12 grossly intact. Sensation intact, DTR normal. Strength 5/5 in all 4.  Psychiatric: Normal judgment and insight. Alert and oriented x 3. Extremely anxious and required redirecting to help calm his mood.    Labs on Admission: I have personally reviewed following labs and imaging studies  CBC: Recent Labs  Lab 08/14/2019 1654  WBC 16.3*  HGB 10.6*  HCT 32.3*  MCV 93.6  PLT 307   Basic Metabolic Panel: Recent Labs  Lab 08/14/2019 1654  NA 133*  K 4.0  CL 96*  CO2 24  GLUCOSE 229*  BUN 34*  CREATININE 1.97*  CALCIUM 8.6*   GFR: Estimated Creatinine Clearance: 34.8 mL/min (A) (by C-G formula based on SCr of 1.97 mg/dL (H)). Liver Function Tests: No results for input(s): AST, ALT, ALKPHOS, BILITOT, PROT, ALBUMIN in the last 168 hours. No results for input(s): LIPASE, AMYLASE in the last 168 hours. No results for input(s): AMMONIA in the last 168 hours. Coagulation Profile: Recent Labs  Lab 09/01/2019 1901  INR 5.0*   Cardiac Enzymes: No  results for input(s): CKTOTAL, CKMB, CKMBINDEX, TROPONINI in the last 168 hours. BNP (last 3 results) No results for input(s): PROBNP in the last 8760 hours. HbA1C: No results for input(s): HGBA1C in the last 72 hours. CBG: No results for input(s): GLUCAP in the last 168  hours. Lipid Profile: No results for input(s): CHOL, HDL, LDLCALC, TRIG, CHOLHDL, LDLDIRECT in the last 72 hours. Thyroid Function Tests: No results for input(s): TSH, T4TOTAL, FREET4, T3FREE, THYROIDAB in the last 72 hours. Anemia Panel: No results for input(s): VITAMINB12, FOLATE, FERRITIN, TIBC, IRON, RETICCTPCT in the last 72 hours. Urine analysis:    Component Value Date/Time   COLORURINE YELLOW 01/15/2012 0946   APPEARANCEUR CLEAR 01/15/2012 0946   LABSPEC 1.024 01/15/2012 0946   PHURINE 5.0 01/15/2012 0946   GLUCOSEU NEGATIVE 01/15/2012 0946   HGBUR NEGATIVE 01/15/2012 0946   BILIRUBINUR SMALL (A) 01/15/2012 0946   KETONESUR NEGATIVE 01/15/2012 0946   PROTEINUR NEGATIVE 01/15/2012 0946   UROBILINOGEN 0.2 01/15/2012 0946   NITRITE NEGATIVE 01/15/2012 0946   LEUKOCYTESUR NEGATIVE 01/15/2012 0946    Radiological Exams on Admission: Dg Chest 2 View  Result Date: 08-15-2019 CLINICAL DATA:  Sob. Pt explained that he's been out of work since 9/3 because he started experiencing emesis constantly. Pt has been feeling progressively more sob, stating he gets out of breath even walking from room to room at home. EXAM: CHEST - 2 VIEW COMPARISON:  Chest radiograph 08/23/2019, 08/08/2019 FINDINGS: Stable cardiomediastinal contours status post median sternotomy and CABG. Central vascular could check congestion. There are bilateral increased interstitial markings similar to prior likely representing mild pulmonary edema. Small scattered linear opacities likely reflect atelectasis. No new focal infiltrate. No pneumothorax or pleural effusion. No acute finding in the visualized skeleton. IMPRESSION: Vascular congestion and  diffuse bilateral interstitial markings similar to prior likely representing mild pulmonary edema. Scattered atelectasis. No new focal infiltrate. Electronically Signed   By: Emmaline KluverNancy  Ballantyne M.D.   On: 009-09-2019 17:43    EKG: Independently reviewed.   Assessment/Plan  Acute hypoxic respiratory failure possibly secondary to CHF exacerbation -Patient appears euvolemic on exam but was significantly dyspneic on exam requiring 3 L via nasal cannula -Chest x-ray revealed pulmonary edema - will give 1 dose of IV 20 mg Lasix. Appreciate cardiology recs on further diuresis. -Daily intake and output, daily weights -Cardiology was consulted by EDP and recommends echocardiogram in the morning  Unknown source of fever -Patient reports fever and being diaphoretic at nighttime at home.  However he is afebrile here but has leukocytosis.  We will continue to monitor fever curve. - will start Rocephin for possible dental abscess - blood culture pending  AKI - likely due to hypoperfusion - continue to monitor with BMP. Would expect increase with diuresis.   Frequent PVCs  - will give one time dose of IV magnesium  - check Mg level  Elevated INR - Coumadin of 5. Will hold. - Pharmacy to monitor.  Aortic stenosis status post AVR - hold Coumadin due to elevated INR   Type 2 diabetes - start low dose SSI    Med rec was not reviewed by pharmacy prior to end of shift.   DVT prophylaxis: hold due to INR of 5 Code Status: Full  Family Communication: Plan discussed with patient at bedside  Disposition Plan: Home with at least 2 midnight stay secondary to acute CHF exac requiring oxygen Consults called: Cardiology Admission status: inpatient  Tonja Jezewski T Azeez Dunker DO [facility]   If 7PM-7AM, please contact night-coverage [email or web address] Password [email or web address]  08-15-2019, 10:43 PM

## 2019-08-28 NOTE — ED Notes (Signed)
Admitting provider at bedside.

## 2019-08-28 NOTE — ED Notes (Signed)
Dr. Tyrone Nine notified that pt coming back to room and Trop 103.

## 2019-08-28 NOTE — ED Triage Notes (Signed)
Patient having continued SOB with tachypnea since 9/1. Patient denies CP, but reports some intermittent pain to left lower side. No history of resp. Issues. Sent from [place] to be evaluated for possible heart failure or cardiac problem. Alert and oriented

## 2019-08-28 NOTE — ED Provider Notes (Signed)
[facility] EMERGENCY DEPARTMENT Provider Note   CSN: [id number] Arrival date & time: 09-24-2019  1616     History   Chief Complaint Chief Complaint  Patient presents with   SOB/ Tachypnea    HPI BURYL SCHNEIDERS is a 68 y.o. male with past medical history significant for aortic stenosis s/p mechanical AVR in 2013 on Coumadin, CHF, type 2 diabetes, hypothyroidism presents to emergency department today with chief complaint of shortness of breath and tachypnea.  This been going on x1 month however has progressively worsened over the last week.  Patient states his shortness of breath is worse on exertion and at night.  He also reports fevers up to 101 at home, T max last night.  Patient reports he was treated for pneumonia earlier this month, he is unsure exactly when.  He is also reporting recent hospitalization for dehydration and worsening kidney function.  Patient is currently taking Augmentin as a prophylactic for dental procedure that was scheduled to happen tomorrow.  He has 3 broken teeth with concern for abscesses.  Patient is also voicing concern for possible endocarditis.  He states his doctor was concerned about that given his fevers and was recommended he come to the emergency department today for further evaluation.   Chart review shows echo on 04/08/2019 with LV EF of 60-65%, right ventricle mildly reduced systolic function. His cardiologist is Dr. Diona Browner.  Past Medical History:  Diagnosis Date   Aortic stenosis    Atrial fibrillation (HCC)    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 February 2013   Coronary atherosclerosis of native coronary artery    2 vessel s/p CABG initial surgery in 2004, status post redo coronary bypass grafting x 3 2013   Essential hypertension    H/O hiatal hernia    Hypothyroidism    Mixed hyperlipidemia    Myocardial infarction (HCC) 1998   Osteoarthritis    Pneumonia 2010   Rotator cuff tear    Right shoulder   Sleep apnea    wears CPAP nightly   Type 2 diabetes mellitus (HCC)     Patient Active Problem List   Diagnosis Date Noted   Congestive heart failure (CHF) (HCC) 2019/09/24   S/P aortic valve replacement 01/12/2012   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   S/P Maze operation for atrial fibrillation 01/12/2012   Coronary atherosclerosis of native coronary artery 01/09/2012   Aortic stenosis 12/30/2011   Long term (current) use of anticoagulants 10/21/2011   HYPERLIPIDEMIA 02/11/2011   OBSTRUCTIVE SLEEP APNEA 02/11/2011   HYPERTENSION, BENIGN 02/11/2011   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 Cornelius Moras - SVG to OM, composite SVG/RIMA to PDA   CORONARY ARTERY BYPASS GRAFT  01/12/2012   Procedure: REDO CORONARY ARTERY BYPASS GRAFTING (CABG);  Surgeon: Purcell Nails, MD;  Location: [facility] OR;  Service: Open Heart Surgery;  Laterality: N/A;  Three grafts; Endoscopically harvested left saphenous vein graft and left internal mammary artery   LEFT AND RIGHT HEART CATHETERIZATION WITH CORONARY ANGIOGRAM N/A 12/21/2011  Procedure: LEFT AND RIGHT HEART CATHETERIZATION WITH CORONARY ANGIOGRAM;  Surgeon: Iran OuchMuhammad A Arida, MD;  Location: [facility] CATH LAB;  Service: Cardiovascular;  Laterality: N/A;   MAZE  01/12/2012   Procedure: MAZE;  Surgeon: Purcell Nailslarence H Owen, MD;  [facility];  Service: Open Heart Surgery;  Laterality: N/A;  NO NECKLINE ON THE RIGHT   TONSILLECTOMY          Home Medications    Prior to Admission medications   Medication Sig Start Date End Date Taking? Authorizing  Provider  albuterol (PROAIR HFA) 108 (90 Base) MCG/ACT inhaler Inhale 1-2 puffs into the lungs as needed for wheezing or shortness of breath. 08/22/19   Jonelle SidleMcDowell, Samuel G, MD  allopurinol (ZYLOPRIM) 300 MG tablet Take 300 mg by mouth daily.    [provider]  amLODipine (NORVASC) 5 MG tablet Take 1 tablet (5 mg total) by mouth daily. 10/22/12   Jonelle SidleMcDowell, Samuel G, MD  amoxicillin-clavulanate (AUGMENTIN) 500-125 MG tablet Take 1 tablet by mouth 2 (two) times daily. 08/23/19   [provider]  aspirin 81 MG tablet Take 81 mg by mouth daily.    [provider]  atorvastatin (LIPITOR) 20 MG tablet Take 1 tablet (20 mg total) by mouth daily. 10/22/12   Jonelle SidleMcDowell, Samuel G, MD  furosemide (LASIX) 40 MG tablet Take 1 tablet (40 mg total) by mouth daily. 10/22/12   Jonelle SidleMcDowell, Samuel G, MD  glipiZIDE (GLUCOTROL) 10 MG tablet Take 1 tablet (10 mg total) by mouth 2 (two) times daily before a meal. 08/22/19   Jonelle SidleMcDowell, Samuel G, MD  Insulin Glargine, 1 Unit Dial, (TOUJEO SOLOSTAR) 300 UNIT/ML SOPN Inject 10 Units into the skin daily. 08/22/19   Jonelle SidleMcDowell, Samuel G, MD  iron polysaccharides (NIFEREX) 150 MG capsule Take 150 mg by mouth daily.    [provider]  levothyroxine (SYNTHROID, LEVOTHROID) 75 MCG tablet Take 75 mcg by mouth daily.    [provider]  linagliptin (TRADJENTA) 5 MG TABS tablet Take 1 tablet (5 mg total) by mouth daily. 08/22/19   Jonelle SidleMcDowell, Samuel G, MD  nitroGLYCERIN (NITROSTAT) 0.4 MG SL tablet Place 0.4 mg under the tongue every 5 (five) minutes as needed. For chest pain    [provider]  Tamsulosin HCl (FLOMAX) 0.4 MG CAPS Take 0.4 mg by mouth daily.     [provider]  warfarin (COUMADIN) 5 MG tablet Take 0.5 tab (2.5 mg) daily or as directed by [facility] Patient taking differently: Take 0.5 tab (2.5 mg) daily or as directed by pmd 01/16/12   Wilmon Paliollins, Gina L, PA-C    Family History Family History  Problem Relation  Age of Onset   Cancer Father     Social History Social History   Tobacco Use   Smoking status: Former Smoker    Packs/day: 3.00    Years: 14.00    Pack years: 42.00    Types: Cigarettes    Quit date: 12/05/1982    Years since quitting: 36.7   Smokeless tobacco: Never Used  Substance Use Topics   Alcohol use: No   Drug use: No     Allergies   Patient has no known allergies.   Review of Systems Review of Systems  Constitutional: Positive for fever. Negative for chills.  HENT: Negative for congestion, rhinorrhea, sinus pressure and sore throat.   Eyes: Negative for pain and redness.  Respiratory: Positive for cough and shortness of breath. Negative for wheezing.   Cardiovascular: Negative for chest  pain, palpitations and leg swelling.  Gastrointestinal: Negative for abdominal pain, constipation, diarrhea, nausea and vomiting.  Genitourinary: Negative for dysuria.  Musculoskeletal: Negative for arthralgias, back pain, myalgias and neck pain.  Skin: Negative for rash and wound.  Neurological: Negative for dizziness, syncope, weakness, numbness and headaches.  Psychiatric/Behavioral: Negative for confusion.     Physical Exam Updated Vital Signs BP (!) 140/48 (BP Location: Right Arm)    Pulse 64    Temp 98.7 F (37.1 C) (Oral)    Resp (!) 25    SpO2 94%   Physical Exam Vitals signs and nursing note reviewed.  Constitutional:      General: He is not in acute distress.    Appearance: He is not ill-appearing.  HENT:     Head: Normocephalic and atraumatic.     Right Ear: Tympanic membrane and external ear normal.     Left Ear: Tympanic membrane and external ear normal.     Nose: Nose normal.     Mouth/Throat:     Mouth: Mucous membranes are dry.     Pharynx: Oropharynx is clear.  Eyes:     General: No scleral icterus.       Right eye: No discharge.        Left eye: No discharge.     Extraocular Movements: Extraocular movements intact.     Conjunctiva/sclera:  Conjunctivae normal.     Pupils: Pupils are equal, round, and reactive to light.  Neck:     Musculoskeletal: Normal range of motion.     Vascular: No JVD.  Cardiovascular:     Rate and Rhythm: Normal rate and regular rhythm.     Pulses: Normal pulses.          Radial pulses are 2+ on the right side and 2+ on the left side.       Dorsalis pedis pulses are 2+ on the right side and 2+ on the left side.     Comments: Murmur heard. No click noted Pulmonary:     Comments: Lung sounds globally diminished. No wheezing, rales, or rhonchi.  He is tachypneic.  SPO2 on room air is 94%.  He is speaking in short sentences, no accessory muscle use Abdominal:     Tenderness: There is no right CVA tenderness or left CVA tenderness.     Comments: Abdomen is soft, non-distended, and non-tender in all quadrants. No rigidity, no guarding. No peritoneal signs.  Musculoskeletal: Normal range of motion.     Right lower leg: No edema.     Left lower leg: No edema.  Skin:    General: Skin is warm and dry.     Capillary Refill: Capillary refill takes less than 2 seconds.  Neurological:     Mental Status: He is oriented to person, place, and time.     GCS: GCS eye subscore is 4. GCS verbal subscore is 5. GCS motor subscore is 6.     Comments: Fluent speech, no facial droop.  Psychiatric:        Behavior: Behavior normal.      ED Treatments / Results  Labs (all labs ordered are listed, but only abnormal results are displayed) Labs Reviewed  BASIC METABOLIC PANEL - Abnormal; Notable for the following components:      Result Value   Sodium 133 (*)    Chloride 96 (*)    Glucose, Bld 229 (*)    BUN 34 (*)    Creatinine, Ser 1.97 (*)  Calcium 8.6 (*)    GFR calc non Af Amer 34 (*)    GFR calc Af Amer 39 (*)    All other components within normal limits  CBC - Abnormal; Notable for the following components:   WBC 16.3 (*)    RBC 3.45 (*)    Hemoglobin 10.6 (*)    HCT 32.3 (*)    All other  components within normal limits  BRAIN NATRIURETIC PEPTIDE - Abnormal; Notable for the following components:   B Natriuretic Peptide 389.1 (*)    All other components within normal limits  PROTIME-INR - Abnormal; Notable for the following components:   Prothrombin Time 45.5 (*)    INR 5.0 (*)    All other components within normal limits  MAGNESIUM - Abnormal; Notable for the following components:   Magnesium 2.5 (*)    All other components within normal limits  TROPONIN I (HIGH SENSITIVITY) - Abnormal; Notable for the following components:   Troponin I (High Sensitivity) 103 (*)    All other components within normal limits  TROPONIN I (HIGH SENSITIVITY) - Abnormal; Notable for the following components:   Troponin I (High Sensitivity) 91 (*)    All other components within normal limits  CULTURE, BLOOD (ROUTINE X 2)  CULTURE, BLOOD (SINGLE)  CULTURE, BLOOD (ROUTINE X 2)  SARS CORONAVIRUS 2 (TAT 6-24 HRS)  LACTIC ACID, PLASMA  LACTIC ACID, PLASMA  HIV ANTIBODY (ROUTINE TESTING W REFLEX)  CBC  BASIC METABOLIC PANEL    EKG EKG Interpretation  Date/Time:  Wednesday 08-Sep-2019 16:32:16 EDT Ventricular Rate:  67 PR Interval:    QRS Duration: 122 QT Interval:  516 QTC Calculation: 545 R Axis:   100 Text Interpretation:  Undetermined rhythm Right bundle branch block Inferior infarct , age undetermined T wave abnormality, consider lateral ischemia Abnormal ECG Baseline wander TECHNICALLY DIFFICULT Otherwise no significant change Confirmed by Melene Plan [phone]) on Sep 08, 2019 6:07:49 PM   EKG Interpretation  Date/Time:  Wednesday Sep 08, 2019 18:27:05 EDT Ventricular Rate:  65 PR Interval:    QRS Duration: 129 QT Interval:  428 QTC Calculation: 445 R Axis:   100 Text Interpretation:  Atrial fibrillation Paired ventricular premature complexes RBBB and LPFB now with st depression in inferior leads likely due to positive deflection for BBB Otherwise no significant change  Confirmed by Melene Plan [phone]) on 09-08-19 6:30:30 PM        Radiology  CHEST - 2 VIEW COMPARISON: Chest radiograph 08/23/2019, 08/08/2019 FINDINGS: Stable cardiomediastinal contours status post median sternotomy and CABG. Central vascular could check congestion. There are bilateral increased interstitial markings similar to prior likely representing mild pulmonary edema. Small scattered linear opacities likely reflect atelectasis. No new focal infiltrate. No pneumothorax or pleural effusion. No acute finding in the visualized skeleton. IMPRESSION: Vascular congestion and diffuse bilateral interstitial markings similar to prior likely representing mild pulmonary edema. Scattered atelectasis. No new focal infiltrate.   Electronically Signed By: Emmaline Kluver M.D. On: 09/08/2019 17:43 Procedures .Critical Care Performed by: Sherene Sires, PA-C Authorized by: Sherene Sires, PA-C   Critical care provider statement:    Critical care time (minutes):  38   Critical care was necessary to treat or prevent imminent or life-threatening deterioration of the following conditions:  Respiratory failure   Critical care was time spent personally by me on the following activities:  Discussions with consultants, evaluation of patient's response to treatment, examination of patient, obtaining history from patient or surrogate, ordering and review  of laboratory studies, ordering and review of radiographic studies, re-evaluation of patient's condition, review of old charts, development of treatment plan with patient or surrogate and ordering and performing treatments and interventions   (including critical care time)  Medications Ordered in ED Medications  furosemide (LASIX) injection 20 mg (has no administration in time range)  magnesium sulfate IVPB 1 g 100 mL (has no administration in time range)  cefTRIAXone (ROCEPHIN) 2 g in sodium chloride 0.9 % 100 mL IVPB (has no  administration in time range)     Initial Impression / Assessment and Plan / ED Course  I have reviewed the triage vital signs and the nursing notes.  Pertinent labs & imaging results that were available during my care of the patient were reviewed by me and considered in my medical decision making (see chart for details).  Patient seen and examined. Patient nontoxic appearing.  He is tachypneic and speaking in short sentences.  SPO2 is 94% on room air during exam.  No rash or signs of endocarditis on exam.  Abdomen is soft, non tender.  No swelling of lower extremities. Pt does not appear to be volume overloaded.  Labs today are significant for leukocytosis of 16.3, BNP of 389.1.  Troponin 103, second troponin 91. INR is 5.  Chest xray shows pulmonary edema without new infiltrate. Blood cultures, lactic acid are pending. Case discussed with cardiologist Dr. De Nurse who recommends medical admission for tachypnea with echo tomorrow and cardiology consult to review results. He has low suspicion for endocarditis and does not recommend starting antibiotics at this time. On reassessment pt is hypoxic, 3L nasal cannula applied with improvement. This case was discussed with Dr. Tyrone Nine who has seen the patient and agrees with plan to admit.  Spoke with Dr. Flossie Buffy with hospitalist service who agrees to assume care of patient and bring into the hospital for further evaluation and management.    Portions of this note were generated with [employer]. Dictation errors may occur despite best attempts at proofreading.    Final Clinical Impressions(s) / ED Diagnoses   Final diagnoses:  Acute respiratory distress    ED Discharge Orders    None       Cherre Robins, PA-C 2019/09/05 2347    Deno Etienne, DO 08/29/19 1502

## 2019-08-29 ENCOUNTER — Encounter (HOSPITAL_COMMUNITY): Payer: Self-pay | Admitting: General Practice

## 2019-08-29 ENCOUNTER — Inpatient Hospital Stay (HOSPITAL_COMMUNITY): Payer: BC Managed Care – PPO

## 2019-08-29 DIAGNOSIS — Z952 Presence of prosthetic heart valve: Secondary | ICD-10-CM

## 2019-08-29 DIAGNOSIS — I340 Nonrheumatic mitral (valve) insufficiency: Secondary | ICD-10-CM

## 2019-08-29 DIAGNOSIS — I509 Heart failure, unspecified: Secondary | ICD-10-CM

## 2019-08-29 DIAGNOSIS — J9601 Acute respiratory failure with hypoxia: Secondary | ICD-10-CM

## 2019-08-29 DIAGNOSIS — R6521 Severe sepsis with septic shock: Secondary | ICD-10-CM

## 2019-08-29 DIAGNOSIS — I5033 Acute on chronic diastolic (congestive) heart failure: Secondary | ICD-10-CM

## 2019-08-29 DIAGNOSIS — R0603 Acute respiratory distress: Secondary | ICD-10-CM

## 2019-08-29 LAB — CBC
HCT: 31.3 % — ABNORMAL LOW (ref 39.0–52.0)
Hemoglobin: 10.7 g/dL — ABNORMAL LOW (ref 13.0–17.0)
MCH: 31.8 pg (ref 26.0–34.0)
MCHC: 34.2 g/dL (ref 30.0–36.0)
MCV: 92.9 fL (ref 80.0–100.0)
Platelets: 385 10*3/uL (ref 150–400)
RBC: 3.37 MIL/uL — ABNORMAL LOW (ref 4.22–5.81)
RDW: 13.4 % (ref 11.5–15.5)
WBC: 20.6 10*3/uL — ABNORMAL HIGH (ref 4.0–10.5)
nRBC: 0 % (ref 0.0–0.2)

## 2019-08-29 LAB — CBC WITH DIFFERENTIAL/PLATELET
Abs Immature Granulocytes: 0.13 10*3/uL — ABNORMAL HIGH (ref 0.00–0.07)
Basophils Absolute: 0.1 10*3/uL (ref 0.0–0.1)
Basophils Relative: 0 %
Eosinophils Absolute: 0.1 10*3/uL (ref 0.0–0.5)
Eosinophils Relative: 1 %
HCT: 32.3 % — ABNORMAL LOW (ref 39.0–52.0)
Hemoglobin: 10.4 g/dL — ABNORMAL LOW (ref 13.0–17.0)
Immature Granulocytes: 1 %
Lymphocytes Relative: 8 %
Lymphs Abs: 1.7 10*3/uL (ref 0.7–4.0)
MCH: 30.3 pg (ref 26.0–34.0)
MCHC: 32.2 g/dL (ref 30.0–36.0)
MCV: 94.2 fL (ref 80.0–100.0)
Monocytes Absolute: 1.3 10*3/uL — ABNORMAL HIGH (ref 0.1–1.0)
Monocytes Relative: 7 %
Neutro Abs: 16.8 10*3/uL — ABNORMAL HIGH (ref 1.7–7.7)
Neutrophils Relative %: 83 %
Platelets: 414 10*3/uL — ABNORMAL HIGH (ref 150–400)
RBC: 3.43 MIL/uL — ABNORMAL LOW (ref 4.22–5.81)
RDW: 13.4 % (ref 11.5–15.5)
WBC: 20.1 10*3/uL — ABNORMAL HIGH (ref 4.0–10.5)
nRBC: 0 % (ref 0.0–0.2)

## 2019-08-29 LAB — BASIC METABOLIC PANEL
Anion gap: 12 (ref 5–15)
BUN: 36 mg/dL — ABNORMAL HIGH (ref 8–23)
CO2: 23 mmol/L (ref 22–32)
Calcium: 8.4 mg/dL — ABNORMAL LOW (ref 8.9–10.3)
Chloride: 97 mmol/L — ABNORMAL LOW (ref 98–111)
Creatinine, Ser: 1.82 mg/dL — ABNORMAL HIGH (ref 0.61–1.24)
GFR calc Af Amer: 43 mL/min — ABNORMAL LOW (ref >60)
GFR calc non Af Amer: 37 mL/min — ABNORMAL LOW (ref >60)
Glucose, Bld: 209 mg/dL — ABNORMAL HIGH (ref 70–99)
Potassium: 4.7 mmol/L (ref 3.5–5.1)
Sodium: 132 mmol/L — ABNORMAL LOW (ref 135–145)

## 2019-08-29 LAB — PROTIME-INR
INR: 5 (ref 0.8–1.2)
Prothrombin Time: 45.8 seconds — ABNORMAL HIGH (ref 11.4–15.2)

## 2019-08-29 LAB — BLOOD CULTURE ID PANEL (REFLEXED)

## 2019-08-29 LAB — PROCALCITONIN: Procalcitonin: 0.31 ng/mL

## 2019-08-29 LAB — HEPATIC FUNCTION PANEL
ALT: 37 U/L (ref 0–44)
AST: 39 U/L (ref 15–41)
Albumin: 2.8 g/dL — ABNORMAL LOW (ref 3.5–5.0)
Alkaline Phosphatase: 110 U/L (ref 38–126)
Bilirubin, Direct: 0.4 mg/dL — ABNORMAL HIGH (ref 0.0–0.2)
Indirect Bilirubin: 1.1 mg/dL — ABNORMAL HIGH (ref 0.3–0.9)
Total Bilirubin: 1.5 mg/dL — ABNORMAL HIGH (ref 0.3–1.2)
Total Protein: 6.8 g/dL (ref 6.5–8.1)

## 2019-08-29 LAB — BLOOD GAS, ARTERIAL
Acid-Base Excess: 0.3 mmol/L (ref 0.0–2.0)
Bicarbonate: 23 mmol/L (ref 20.0–28.0)
Drawn by: 129711
O2 Content: 4 L/min
O2 Saturation: 83.6 %
Patient temperature: 98.6
pCO2 arterial: 28.4 mmHg — ABNORMAL LOW (ref 32.0–48.0)
pH, Arterial: 7.518 — ABNORMAL HIGH (ref 7.350–7.450)
pO2, Arterial: 46.5 mmHg — ABNORMAL LOW (ref 83.0–108.0)

## 2019-08-29 LAB — C-REACTIVE PROTEIN: CRP: 12.5 mg/dL — ABNORMAL HIGH (ref <1.0)

## 2019-08-29 LAB — SARS CORONAVIRUS 2 (TAT 6-24 HRS): SARS Coronavirus 2: NEGATIVE

## 2019-08-29 LAB — LACTIC ACID, PLASMA: Lactic Acid, Venous: 1.4 mmol/L (ref 0.5–1.9)

## 2019-08-29 LAB — GLUCOSE, CAPILLARY
Glucose-Capillary: 194 mg/dL — ABNORMAL HIGH (ref 70–99)
Glucose-Capillary: 259 mg/dL — ABNORMAL HIGH (ref 70–99)
Glucose-Capillary: 426 mg/dL — ABNORMAL HIGH (ref 70–99)

## 2019-08-29 LAB — CBG MONITORING, ED
Glucose-Capillary: 168 mg/dL — ABNORMAL HIGH (ref 70–99)
Glucose-Capillary: 182 mg/dL — ABNORMAL HIGH (ref 70–99)

## 2019-08-29 LAB — GLUCOSE, RANDOM: Glucose, Bld: 404 mg/dL — ABNORMAL HIGH (ref 70–99)

## 2019-08-29 LAB — HEMOGLOBIN A1C
Hgb A1c MFr Bld: 9.8 % — ABNORMAL HIGH (ref 4.8–5.6)
Mean Plasma Glucose: 234.56 mg/dL

## 2019-08-29 LAB — D-DIMER, QUANTITATIVE: D-Dimer, Quant: 4.27 ug/mL-FEU — ABNORMAL HIGH (ref 0.00–0.50)

## 2019-08-29 LAB — BRAIN NATRIURETIC PEPTIDE: B Natriuretic Peptide: 546 pg/mL — ABNORMAL HIGH (ref 0.0–100.0)

## 2019-08-29 LAB — LACTATE DEHYDROGENASE: LDH: 298 U/L — ABNORMAL HIGH (ref 98–192)

## 2019-08-29 LAB — HIV ANTIBODY (ROUTINE TESTING W REFLEX): HIV Screen 4th Generation wRfx: NONREACTIVE

## 2019-08-29 LAB — ECHOCARDIOGRAM COMPLETE

## 2019-08-29 MED ORDER — ASPIRIN 81 MG PO CHEW
81.0000 mg | CHEWABLE_TABLET | Freq: Every day | ORAL | Status: DC
  Administered 2019-08-29 – 2019-09-01 (×4): 81 mg via ORAL
  Filled 2019-08-29 (×4): qty 1

## 2019-08-29 MED ORDER — GUAIFENESIN-DM 100-10 MG/5ML PO SYRP
5.0000 mL | ORAL_SOLUTION | ORAL | Status: DC | PRN
  Administered 2019-08-30: 22:00:00 5 mL via ORAL
  Filled 2019-08-29: qty 5

## 2019-08-29 MED ORDER — ENSURE ENLIVE PO LIQD
237.0000 mL | Freq: Two times a day (BID) | ORAL | Status: DC
  Administered 2019-08-30 (×2): 237 mL via ORAL

## 2019-08-29 MED ORDER — FUROSEMIDE 10 MG/ML IJ SOLN
40.0000 mg | Freq: Once | INTRAMUSCULAR | Status: AC
  Administered 2019-08-29: 40 mg via INTRAVENOUS
  Filled 2019-08-29: qty 4

## 2019-08-29 MED ORDER — PERFLUTREN LIPID MICROSPHERE
1.0000 mL | INTRAVENOUS | Status: AC | PRN
  Administered 2019-08-29: 5 mL via INTRAVENOUS
  Filled 2019-08-29: qty 10

## 2019-08-29 MED ORDER — AMLODIPINE BESYLATE 5 MG PO TABS
5.0000 mg | ORAL_TABLET | Freq: Every day | ORAL | Status: DC
  Administered 2019-08-29 – 2019-08-31 (×3): 5 mg via ORAL
  Filled 2019-08-29 (×3): qty 1

## 2019-08-29 MED ORDER — METHYLPREDNISOLONE SODIUM SUCC 125 MG IJ SOLR
125.0000 mg | Freq: Once | INTRAMUSCULAR | Status: AC
  Administered 2019-08-29: 125 mg via INTRAVENOUS
  Filled 2019-08-29: qty 2

## 2019-08-29 MED ORDER — LEVOTHYROXINE SODIUM 75 MCG PO TABS
75.0000 ug | ORAL_TABLET | Freq: Every day | ORAL | Status: DC
  Administered 2019-08-31 – 2019-09-02 (×3): 75 ug via ORAL
  Filled 2019-08-29 (×4): qty 1

## 2019-08-29 MED ORDER — SODIUM CHLORIDE 0.9 % IV SOLN
2.0000 g | Freq: Two times a day (BID) | INTRAVENOUS | Status: DC
  Administered 2019-08-29: 2 g via INTRAVENOUS
  Filled 2019-08-29 (×2): qty 2

## 2019-08-29 MED ORDER — SODIUM CHLORIDE 0.9 % IV SOLN
2.0000 g | Freq: Four times a day (QID) | INTRAVENOUS | Status: DC
  Administered 2019-08-29 – 2019-08-30 (×2): 2 g via INTRAVENOUS
  Filled 2019-08-29 (×3): qty 2000
  Filled 2019-08-29: qty 2

## 2019-08-29 MED ORDER — SODIUM CHLORIDE 0.9 % IV SOLN
1.0000 g | Freq: Two times a day (BID) | INTRAVENOUS | Status: DC
  Filled 2019-08-29 (×2): qty 1

## 2019-08-29 MED ORDER — INSULIN GLARGINE 100 UNIT/ML ~~LOC~~ SOLN
10.0000 [IU] | Freq: Every day | SUBCUTANEOUS | Status: DC
  Administered 2019-08-29: 10 [IU] via SUBCUTANEOUS
  Filled 2019-08-29 (×2): qty 0.1

## 2019-08-29 MED ORDER — TAMSULOSIN HCL 0.4 MG PO CAPS
0.4000 mg | ORAL_CAPSULE | Freq: Every day | ORAL | Status: DC
  Administered 2019-08-29 – 2019-09-01 (×4): 0.4 mg via ORAL
  Filled 2019-08-29 (×4): qty 1

## 2019-08-29 MED ORDER — FUROSEMIDE 10 MG/ML IJ SOLN
20.0000 mg | Freq: Two times a day (BID) | INTRAMUSCULAR | Status: DC
  Administered 2019-08-29 – 2019-08-30 (×3): 20 mg via INTRAVENOUS
  Filled 2019-08-29 (×3): qty 2

## 2019-08-29 MED ORDER — IPRATROPIUM-ALBUTEROL 0.5-2.5 (3) MG/3ML IN SOLN
3.0000 mL | Freq: Four times a day (QID) | RESPIRATORY_TRACT | Status: DC
  Administered 2019-08-30: 3 mL via RESPIRATORY_TRACT
  Filled 2019-08-29: qty 3

## 2019-08-29 MED ORDER — INSULIN ASPART 100 UNIT/ML ~~LOC~~ SOLN
0.0000 [IU] | Freq: Three times a day (TID) | SUBCUTANEOUS | Status: DC
  Administered 2019-08-29 (×2): 2 [IU] via SUBCUTANEOUS
  Administered 2019-08-29: 5 [IU] via SUBCUTANEOUS
  Administered 2019-08-30: 9 [IU] via SUBCUTANEOUS

## 2019-08-29 MED ORDER — POLYSACCHARIDE IRON COMPLEX 150 MG PO CAPS
150.0000 mg | ORAL_CAPSULE | Freq: Every day | ORAL | Status: DC
  Administered 2019-08-29 – 2019-09-01 (×4): 150 mg via ORAL
  Filled 2019-08-29 (×5): qty 1

## 2019-08-29 MED ORDER — NITROGLYCERIN 0.4 MG SL SUBL: 0.4000 mg | SUBLINGUAL_TABLET | SUBLINGUAL | Status: DC | PRN

## 2019-08-29 MED ORDER — ATORVASTATIN CALCIUM 10 MG PO TABS
20.0000 mg | ORAL_TABLET | Freq: Every day | ORAL | Status: DC
  Administered 2019-08-29 – 2019-09-01 (×4): 20 mg via ORAL
  Filled 2019-08-29 (×4): qty 2

## 2019-08-29 MED ORDER — IPRATROPIUM-ALBUTEROL 0.5-2.5 (3) MG/3ML IN SOLN
3.0000 mL | RESPIRATORY_TRACT | Status: DC
  Administered 2019-08-29 (×2): 3 mL via RESPIRATORY_TRACT
  Filled 2019-08-29 (×2): qty 3

## 2019-08-29 MED ORDER — SODIUM CHLORIDE 0.9 % IV SOLN
INTRAVENOUS | Status: DC
  Administered 2019-08-30: 05:00:00 via INTRAVENOUS

## 2019-08-29 MED ORDER — ALLOPURINOL 300 MG PO TABS
300.0000 mg | ORAL_TABLET | Freq: Every day | ORAL | Status: DC
  Administered 2019-08-29 – 2019-08-30 (×2): 300 mg via ORAL
  Filled 2019-08-29 (×2): qty 1

## 2019-08-29 MED ORDER — METHYLPREDNISOLONE SODIUM SUCC 40 MG IJ SOLR
40.0000 mg | Freq: Four times a day (QID) | INTRAMUSCULAR | Status: DC
  Administered 2019-08-29 – 2019-08-30 (×3): 40 mg via INTRAVENOUS
  Filled 2019-08-29 (×3): qty 1

## 2019-08-29 MED ORDER — INSULIN ASPART 100 UNIT/ML ~~LOC~~ SOLN
0.0000 [IU] | Freq: Every day | SUBCUTANEOUS | Status: DC
  Administered 2019-08-29: 5 [IU] via SUBCUTANEOUS

## 2019-08-29 NOTE — Consult Note (Addendum)
Cardiology Consultation:   Patient ID: ANDY ALLENDE MRN: [id number]; DOB: 1950-12-19  Admit date: 08/09/2019 Date of Consult: 08/29/2019  Primary Care Provider: Ignatius Specking, MD Primary Cardiologist: Nona Dell, MD  Primary Electrophysiologist:  None    Patient Profile:   [name] is a 68 y.o. male with a hx of CAD s/p CABG, afib s/p maze procedure, Chronic diastolic HF, aortic stenosis s/p mechanical AVR in 2013 on coumadin, DM2, hypothyroidism who is being seen today for the evaluation of afib, CHF, and possible endocarditis at the request of Dr. Cyndia Bent.  History of Present Illness:   Mr. Onnen sees Dr. Diona Browner in [place] for the above cardiac problems. He follows with Dr. Sherril Croon for coumadin managment. He has a history of CAD s/p CABG with redo in 2013, aortic stenosis s/p mechanical aortic valve replacement in 2013 at the time of redo CABG. He has a history of afib with previous Cox-Maze procedure in 2013. EF at that time was 55-60%. He had a normal myoview in 2015 with an echo showing EF 65-70% with G1DD. He had another Myoview in 2017 that was low risk and showed a region inferolateral scar with mild peri-infarct ischemia. EF was unchanged since prior and EKG was stable so medical management was continued. His last office visit with Dr. Diona Browner was 08/19/19 for regular follow up. He reported 3 hospitalizations at [facility] since April for acute kidney failure and PNA. Metoprolol was discontinued for bradycardia during one of the admissions. He remained on Lasix at that time. During one admission in July echo was done which showed EF > 55%, mild prosthetic aortic valve stenosis, and mild LVH.  Patient presented to the ED 08/31/2019 for shortness of breath and tachypnea that has been ongoing for 1 month but worse over the last week. He says sob is worse on exertion and at night. He reports fevers at home up to 101. Since his last discharge from [facility] he felt like he never fully  recovered. In the ED patient was afebrile and normotensive, oxygenating well on room air. Labs revealed WBC 16.3, Hgb 10.6. Sodium 133, potassium. Creatinine 1.97 (baseline 1). Hs troponin 103 > 91. BNP 389. COVID negative. CXR showed vascular congestion with diffuse bilateral interstitial markings likely representing mild pulmonary edema. EKG showed afib with old RBBB and nonspecific ST changes. Patient became hypoxic and was placed on 3 L O2.  Patient reported he is on Augmentin for a scheduled dental procedure. He has 3 broken teeth with concern for abscesses. Patient was admitted from further work-up. Echo was ordered.   Patient quit smoking over 30 years ago. Denies alcohol and drug use. Notes a 10 lb weight loss in the last week and a half attributed to decreased appetite. 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 Denies recent chest pain.   Heart Pathway Score:     Past Medical History:  Diagnosis Date  . Aortic stenosis   . Atrial fibrillation (HCC)    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 February 2013  . CHF (congestive heart failure) (HCC)   . Coronary atherosclerosis of native coronary artery    2 vessel s/p CABG initial surgery in 2004, status post redo coronary bypass grafting x 3 2013  . Essential hypertension   . H/O hiatal hernia   . Hypothyroidism   . Mixed hyperlipidemia   . Myocardial infarction (HCC) 1998  . Osteoarthritis   . Pneumonia 2010  .  Rotator cuff tear    Right shoulder  . Sleep apnea    wears CPAP nightly  . Type 2 diabetes mellitus (HCC)     Past Surgical History:  Procedure Laterality Date  . AORTIC VALVE REPLACEMENT  01/12/2012   Procedure: AORTIC VALVE REPLACEMENT (AVR);  Surgeon: Purcell Nails, MD;  Location: [facility];  Service: Open Heart Surgery;  Laterality: N/A;  NO NECKLINE ON THE RIGHT  . APPENDECTOMY    . CARDIAC CATHETERIZATION  12/21/2011  . CARDIOVERSION  11/18/2011  .  CHOLECYSTECTOMY    . CORONARY ARTERY BYPASS GRAFT  04/02/2003   Dr. Cornelius Moras - SVG to OM, composite SVG/RIMA to PDA  . CORONARY ARTERY BYPASS GRAFT  01/12/2012   Procedure: REDO CORONARY ARTERY BYPASS GRAFTING (CABG);  Surgeon: Purcell Nails, MD;  Location: [facility] OR;  Service: Open Heart Surgery;  Laterality: N/A;  Three grafts; Endoscopically harvested left saphenous vein graft and left internal mammary artery  . LEFT AND RIGHT HEART CATHETERIZATION WITH CORONARY ANGIOGRAM N/A 12/21/2011   Procedure: LEFT AND RIGHT HEART CATHETERIZATION WITH CORONARY ANGIOGRAM;  Surgeon: Iran Ouch, MD;  Location: [facility] CATH LAB;  Service: Cardiovascular;  Laterality: N/A;  . MAZE  01/12/2012   Procedure: MAZE;  Surgeon: Purcell Nails, MD;  Location: [facility] OR;  Service: Open Heart Surgery;  Laterality: N/A;  NO NECKLINE ON THE RIGHT  . TONSILLECTOMY       Home Medications:  Prior to Admission medications   Medication Sig Start Date End Date Taking? Authorizing Provider  albuterol (PROAIR HFA) 108 (90 Base) MCG/ACT inhaler Inhale 1-2 puffs into the lungs as needed for wheezing or shortness of breath. 08/22/19  Yes Jonelle Sidle, MD  allopurinol (ZYLOPRIM) 300 MG tablet Take 300 mg by mouth daily.   Yes [provider]  amLODipine (NORVASC) 5 MG tablet Take 1 tablet (5 mg total) by mouth daily. 10/22/12  Yes Jonelle Sidle, MD  amoxicillin-clavulanate (AUGMENTIN) 500-125 MG tablet Take 1 tablet by mouth 2 (two) times daily. 08/23/19  Yes [provider]  aspirin 81 MG tablet Take 81 mg by mouth daily.   Yes [provider]  atorvastatin (LIPITOR) 20 MG tablet Take 1 tablet (20 mg total) by mouth daily. 10/22/12  Yes Jonelle Sidle, MD  furosemide (LASIX) 40 MG tablet Take 1 tablet (40 mg total) by mouth daily. 10/22/12  Yes Jonelle Sidle, MD  glipiZIDE (GLUCOTROL) 10 MG tablet Take 1 tablet (10 mg total) by mouth 2 (two) times daily before a meal. 08/22/19  Yes Jonelle Sidle, MD  Insulin Glargine, 1 Unit Dial, (TOUJEO SOLOSTAR) 300 UNIT/ML SOPN Inject 10 Units into the skin daily. Patient taking differently: Inject 10 Units into the skin at bedtime.  08/22/19  Yes Jonelle Sidle, MD  iron polysaccharides (NIFEREX) 150 MG capsule Take 150 mg by mouth daily.   Yes [provider]  levothyroxine (SYNTHROID, LEVOTHROID) 75 MCG tablet Take 75 mcg by mouth daily.   Yes [provider]  linagliptin (TRADJENTA) 5 MG TABS tablet Take 1 tablet (5 mg total) by mouth daily. 08/22/19  Yes Jonelle Sidle, MD  nitroGLYCERIN (NITROSTAT) 0.4 MG SL tablet Place 0.4 mg under the tongue every 5 (five) minutes as needed. For chest pain   Yes [provider]  Tamsulosin HCl (FLOMAX) 0.4 MG CAPS Take 0.4 mg by mouth daily.    Yes [provider]  warfarin (COUMADIN) 5 MG tablet Take 0.5  tab (2.5 mg) daily or as directed by [facility] Patient taking differently: Take 2.5-5 mg by mouth See admin instructions. Take 5mg  on TUE and FRI, and 2.5mg  on all other days. 01/16/12  Yes Wilmon Paliollins, Gina L, PA-C    Inpatient Medications: Scheduled Meds: . feeding supplement (ENSURE ENLIVE)  237 mL Oral BID BM  . furosemide  20 mg Intravenous Q12H  . insulin aspart  0-5 Units Subcutaneous QHS  . insulin aspart  0-9 Units Subcutaneous TID WC   Continuous Infusions: . cefTRIAXone (ROCEPHIN)  IV Stopped (08/29/19 0032)   PRN Meds:   Allergies:   No Known Allergies  Social History:   Social History   Socioeconomic History  . Marital status: Married    Spouse name: Not on file  . Number of children: Not on file  . Years of education: Not on file  . Highest education level: Not on file  Occupational History  . Occupation: Hydrographic surveyorCommercial truck driver    Comment: Full time  Social Needs  . Financial resource strain: Not on file  . Food insecurity    Worry: Not on file    Inability: Not on file  . Transportation needs    Medical: Not on file     Non-medical: Not on file  Tobacco Use  . Smoking status: Former Smoker    Packs/day: 3.00    Years: 14.00    Pack years: 42.00    Types: Cigarettes    Quit date: 12/05/1982    Years since quitting: 36.7  . Smokeless tobacco: Never Used  Substance and Sexual Activity  . Alcohol use: No  . Drug use: No  . Sexual activity: Not on file  Lifestyle  . Physical activity    Days per week: Not on file    Minutes per session: Not on file  . Stress: Not on file  Relationships  . Social Musicianconnections    Talks on phone: Not on file    Gets together: Not on file    Attends religious service: Not on file    Active member of club or organization: Not on file    Attends meetings of clubs or organizations: Not on file    Relationship status: Not on file  . Intimate partner violence    Fear of current or ex partner: Not on file    Emotionally abused: Not on file    Physically abused: Not on file    Forced sexual activity: Not on file  Other Topics Concern  . Not on file  Social History Narrative  . Not on file    Family History:   Family History  Problem Relation Age of Onset  . Cancer Father      ROS:  Please see the history of present illness.  All other ROS reviewed and negative.     Physical Exam/Data:   Vitals:   08/29/19 1055 08/29/19 1100 08/29/19 1130 08/29/19 1232  BP: (!) 135/53 109/76 (!) 128/51 (!) 137/58  Pulse: (!) 55 (!) 52 63 60  Resp: (!) 28 (!) 24 (!) 31 (!) 22  Temp:    (!) 97.5 F (36.4 C)  TempSrc:    Oral  SpO2: 95% 97%  95%  Weight:    77.6 kg  Height:    5\' 8"  (1.727 m)   No intake or output data in the 24 hours ending 08/29/19 1500 Last 3 Weights 08/29/2019 08/19/2019 04/01/2019  Weight (lbs) 171 lb 1.6 oz 174 lb 6.4 oz  185 lb  Weight (kg) 77.61 kg 79.107 kg 83.915 kg     Body mass index is 26.02 kg/m.  General:  Well nourished, well developed WM, breathing is labored HEENT: normal Lymph: no adenopathy Neck: no JVD Endocrine:  No thryomegaly  Vascular: No carotid bruits; FA pulses 2+ bilaterally without bruits  Cardiac:  normal S1, S2; RRR; systolic murmur  Lungs: Crackles bilaterally, no wheezing, rhonchi or rales  Abd: soft, nontender, no hepatomegaly  Ext: no edema Musculoskeletal:  No deformities, BUE and BLE strength normal and equal Skin: warm and dry  Neuro:  CNs 2-12 intact, no focal abnormalities noted Psych:  Normal affect   EKG:  The EKG was personally reviewed and demonstrates:  Afib, 67 bpm, old RBBB, TWI V3-Vr, nonspecific ST changes Telemetry:  Telemetry was personally reviewed and demonstrates:  Appears to have previously been in afib. Now he is in NSR, rates in the 60-70s with occasional PVCs  Relevant CV Studies:  Echo 08/29/19  1. The tricuspid valve is normal in structure. Tricuspid valve regurgitation is trivial. There is a 1.7 cm mobile mass attached to the tricuspid valve and prolapsing back and forth across the valve, concern for endocarditis. Recommend TEE to assess  tricuspid valve.  2. Left ventricular ejection fraction, by visual estimation, is 50 to 55%. The left ventricle has mildly decreased function. Normal left ventricular size. There is no left ventricular hypertrophy. Basal inferior and inferolateral akinesis.  3. Left ventricular diastolic Doppler parameters are indeterminate pattern of LV diastolic filling.  4. Definity contrast agent was given IV to delineate the left ventricular endocardial borders.  5. Mild mitral annular calcification.  6. The mitral valve is normal in structure. Mild to moderate mitral valve regurgitation. No evidence of mitral stenosis.  7. Global right ventricle has mildly reduced systolic function.The right ventricular size is normal. No increase in right ventricular wall thickness.  8. Left atrial size was moderately dilated.  9. Mechanical aortic valve replacement. Mild peri-valvular regurgitation. Mean gradient elevated at 24 mmHg suggesting at least mild  patient-prosthesis mismatch. 10. Right atrial size was normal. 11. The inferior vena cava is dilated in size with >50% respiratory variability, suggesting right atrial pressure of 8 mmHg. 12. TR signal is inadequate for assessing pulmonary artery systolic pressure.    Laboratory Data:  High Sensitivity Troponin:   Recent Labs  Lab 08/26/2019 1654 08/07/2019 1844  TROPONINIHS 103* 91*     Chemistry Recent Labs  Lab 08/27/2019 1654 08/29/19 0300  NA 133* 132*  K 4.0 4.7  CL 96* 97*  CO2 24 23  GLUCOSE 229* 209*  BUN 34* 36*  CREATININE 1.97* 1.82*  CALCIUM 8.6* 8.4*  GFRNONAA 34* 37*  GFRAA 39* 43*  ANIONGAP 13 12    No results for input(s): PROT, ALBUMIN, AST, ALT, ALKPHOS, BILITOT in the last 168 hours. Hematology Recent Labs  Lab 08/11/2019 1654 08/29/19 0300 08/29/19 0811  WBC 16.3* 20.6* 20.1*  RBC 3.45* 3.37* 3.43*  HGB 10.6* 10.7* 10.4*  HCT 32.3* 31.3* 32.3*  MCV 93.6 92.9 94.2  MCH 30.7 31.8 30.3  MCHC 32.8 34.2 32.2  RDW 13.4 13.4 13.4  PLT 307 385 414*   BNP Recent Labs  Lab 08/12/2019 1846 08/29/19 0811  BNP 389.1* 546.0*    DDimer No results for input(s): DDIMER in the last 168 hours.   Radiology/Studies:  Dg Chest 2 View  Result Date: 08/09/2019 CLINICAL DATA:  Sob. Pt explained that he's been out of work since  9/3 because he started experiencing emesis constantly. Pt has been feeling progressively more sob, stating he gets out of breath even walking from room to room at home. EXAM: CHEST - 2 VIEW COMPARISON:  Chest radiograph 08/23/2019, 08/08/2019 FINDINGS: Stable cardiomediastinal contours status post median sternotomy and CABG. Central vascular could check congestion. There are bilateral increased interstitial markings similar to prior likely representing mild pulmonary edema. Small scattered linear opacities likely reflect atelectasis. No new focal infiltrate. No pneumothorax or pleural effusion. No acute finding in the visualized skeleton.  IMPRESSION: Vascular congestion and diffuse bilateral interstitial markings similar to prior likely representing mild pulmonary edema. Scattered atelectasis. No new focal infiltrate. Electronically Signed   By: Emmaline Kluver M.D.   On: 08/31/2019 17:43    Assessment and Plan:   Leukocytosis/Endocarditis ? Patient was sent from [place] for cardiac evaluation for sob and tachypnea. No anginal CP. Reported fevers and night sweats. HS trop 103 > 91. EKG with afib rate controlled and no acute changes. Patient with possible dental abscess on Augmentin for dental procedure - Patient so far afebrile, lactic acid normal, Wbc 20.6 - Echo showed 1.7 cm mobile mass attached to tricuspid valve concern for endocarditis. Recommended TEE to assess - Endocarditis possible source of fever - Blood cultures so far negative, but patient was on abx for dental procedure - Will tentatively schedule patient for TEE tomorrow if INR 5 improves and breathing improves. Patient will need anesthesia at the procedure.   - Patient on abx per IM  Acute hypoxic respiratory failure - Was requiring 3L O2>> 5L >>now on 7L O2 - Received duoneb and IV solumedrol with no improvement - CXR showing pulmonary edema >> was given IV lasix 20 mg but he says he can't tell a difference in his breathing - Patient is now on 7L O2 and has labored breathing - Pulmonary consulted - CT chest was ordered - Will five IV lasix 40 mg to see of breathing improves  Afib  - H/o of Maze procedure on coumadin long term a/c - On admission EKG shows afib with rates in the 60s - Coumadin on hold for INR of 5 >> pharmacy notified - Magnesium 2.5 - Might be back in NSR ?>>EKG ordered  Aortic Stenosis s/p mechanical SVR in 2013  - Long term a/c with coumadin >> on hold for elevated INR  Elevated Troponin/CAD s/p redo CABG 2013 - Troponin trend flat 102 > 91  - Likely from demand ischemia - Patient denies chest pain - Low suspicion for ACS -  Patient had low risk Myoview in 2017  Chronic diastolic HF - CXR with pulmonary edema  - BNP 389 > 546 - On lasix 40 mg daily at home - Patient has no peripheral edema on exam - It appears breathing did not improve with administration of IV lasix 20 mg. He remains on 7L O2 - Can consider trial of Iv lasix to see if breathing improves>> will order 40 mg IV lasix  AKI - 1.97 on admission. Today 1.82 - Monitor daily BMET  Hypertension - Amlodipine 5 mg home med - Pressures reasonable  Hyperlipidemia - Lipitor 20 mg daily - No record on file - order lipid panel  DM2 - SSI per IM - A1C 9.8  For questions or updates, please contact [facility] Please consult [email or web address] for contact info under     Signed, Mearl Olver David Stall, PA-C  08/29/2019 3:00 PM

## 2019-08-29 NOTE — ED Notes (Signed)
TELE 

## 2019-08-29 NOTE — Progress Notes (Signed)
      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     Rhina Brackett Dam 08/29/2019, 10:26 PM

## 2019-08-29 NOTE — ED Notes (Signed)
ED TO INPATIENT HANDOFF REPORT  ED Nurse Name and Phone #:  Marsh Dolly [phone]  S Name/Age/Gender [name] 68 y.o. male Room/Bed: 029C/029C  Code Status   Code Status: Full Code  Home/SNF/Other Home Patient oriented to: self, place, time and situation Is this baseline? Yes   Triage Complete: Triage complete  Chief Complaint Tachypnea  Triage Note Patient having continued SOB with tachypnea since 9/1. Patient denies CP, but reports some intermittent pain to left lower side. No history of resp. Issues. Sent from [place] to be evaluated for possible heart failure or cardiac problem. Alert and oriented   Allergies No Known Allergies  Level of Care/Admitting Diagnosis ED Disposition    ED Disposition Condition Comment   Admit  Hospital Area: [facility] [[id number]]  Level of Care: Telemetry Cardiac [103]  Covid Evaluation: Person Under Investigation (PUI)  Diagnosis: Congestive heart failure (CHF) [facility]) [[id number]]  Admitting Physician: Anselm Jungling [[id number]]  Attending Physician: Anselm Jungling [[id number]]  Estimated length of stay: past midnight tomorrow  Certification:: I certify this patient will need inpatient services for at least 2 midnights  PT Class (Do Not Modify): Inpatient [101]  PT Acc Code (Do Not Modify): Private [1]       B Medical/Surgery History Past Medical History:  Diagnosis Date  . Aortic stenosis   . Atrial fibrillation (HCC)    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 February 2013  . Coronary atherosclerosis of native coronary artery    2 vessel s/p CABG initial surgery in 2004, status post redo coronary bypass grafting x 3 2013  . Essential hypertension   . H/O hiatal hernia   . Hypothyroidism   . Mixed hyperlipidemia   . Myocardial infarction (HCC) 1998  . Osteoarthritis   . Pneumonia 2010  . Rotator cuff tear    Right shoulder  . Sleep apnea    wears CPAP nightly  . Type 2 diabetes mellitus (HCC)     Past Surgical History:  Procedure Laterality Date  . AORTIC VALVE REPLACEMENT  01/12/2012   Procedure: AORTIC VALVE REPLACEMENT (AVR);  Surgeon: Purcell Nails, MD;  Location: [facility] OR;  Service: Open Heart Surgery;  Laterality: N/A;  NO NECKLINE ON THE RIGHT  . APPENDECTOMY    . CARDIAC CATHETERIZATION  12/21/2011  . CARDIOVERSION  11/18/2011  . CHOLECYSTECTOMY    . CORONARY ARTERY BYPASS GRAFT  04/02/2003   Dr. Cornelius Moras - SVG to OM, composite SVG/RIMA to PDA  . CORONARY ARTERY BYPASS GRAFT  01/12/2012   Procedure: REDO CORONARY ARTERY BYPASS GRAFTING (CABG);  Surgeon: Purcell Nails, MD;  Location: [facility] OR;  Service: Open Heart Surgery;  Laterality: N/A;  Three grafts; Endoscopically harvested left saphenous vein graft and left internal mammary artery  . LEFT AND RIGHT HEART CATHETERIZATION WITH CORONARY ANGIOGRAM N/A 12/21/2011   Procedure: LEFT AND RIGHT HEART CATHETERIZATION WITH CORONARY ANGIOGRAM;  Surgeon: Iran Ouch, MD;  Location: [facility] CATH LAB;  Service: Cardiovascular;  Laterality: N/A;  . MAZE  01/12/2012   Procedure: MAZE;  Surgeon: Purcell Nails, MD;  Location: [facility] OR;  Service: Open Heart Surgery;  Laterality: N/A;  NO NECKLINE ON THE RIGHT  . TONSILLECTOMY       A IV Location/Drains/Wounds Patient Lines/Drains/Airways Status   Active Line/Drains/Airways    Name:   Placement date:   Placement time:   Site:   Days:   Peripheral IV 08/13/2019 Left Antecubital   08/25/2019  1828    Antecubital   1   Incision 01/12/12 Chest Other (Comment)   01/12/12    1447     2786   Incision 01/12/12 Leg Left   01/12/12    1447     2786   Incision 01/12/12 Groin Right   01/12/12    1658     2786          Intake/Output Last 24 hours No intake or output data in the 24 hours ending 08/29/19 0352  Labs/Imaging Results for orders placed or performed during the hospital encounter of 12/09/2018 (from the past 48 hour(s))  Basic metabolic panel     Status: Abnormal   Collection Time: 12/09/2018  4:54  PM  Result Value Ref Range   Sodium 133 (L) 135 - 145 mmol/L   Potassium 4.0 3.5 - 5.1 mmol/L   Chloride 96 (L) 98 - 111 mmol/L   CO2 24 22 - 32 mmol/L   Glucose, Bld 229 (H) 70 - 99 mg/dL   BUN 34 (H) 8 - 23 mg/dL   Creatinine, Ser 4.091.97 (H) 0.61 - 1.24 mg/dL   Calcium 8.6 (L) 8.9 - 10.3 mg/dL   GFR calc non Af Amer 34 (L) >60 mL/min   GFR calc Af Amer 39 (L) >60 mL/min   Anion gap 13 5 - 15    Comment: Performed at [facility], [address] [place], [place] [address]  CBC     Status: Abnormal   Collection Time: 12/09/2018  4:54 PM  Result Value Ref Range   WBC 16.3 (H) 4.0 - 10.5 K/uL   RBC 3.45 (L) 4.22 - 5.81 MIL/uL   Hemoglobin 10.6 (L) 13.0 - 17.0 g/dL   HCT 47.832.3 (L) 29.539.0 - 62.152.0 %   MCV 93.6 80.0 - 100.0 fL   MCH 30.7 26.0 - 34.0 pg   MCHC 32.8 30.0 - 36.0 g/dL   RDW 30.813.4 65.711.5 - 84.615.5 %   Platelets 307 150 - 400 K/uL   nRBC 0.0 0.0 - 0.2 %    Comment: Performed at [facility], [address] [place], [place] [address]  Troponin I (High Sensitivity)     Status: Abnormal   Collection Time: 12/09/2018  4:54 PM  Result Value Ref Range   Troponin I (High Sensitivity) 103 (HH) <18 ng/L    Comment: CRITICAL RESULT CALLED TO, READ BACK BY AND VERIFIED WITH:  K NEWMAN,RN 1805 01/05/202020 WBOND (NOTE) Elevated high sensitivity troponin I (hsTnI) values and significant  changes across serial measurements may suggest ACS but many other  chronic and acute conditions are known to elevate hsTnI results.  Refer to the Links section for chest pain algorithms and additional  guidance. Performed at [facility], [address] [place], [place] [address]   Troponin I (High Sensitivity)     Status: Abnormal   Collection Time: 12/09/2018  6:44 PM  Result Value Ref Range   Troponin I (High Sensitivity) 91 (H) <18 ng/L    Comment: (NOTE) Elevated high sensitivity troponin I (hsTnI) values and significant  changes across serial measurements may suggest ACS but many other   chronic and acute conditions are known to elevate hsTnI results.  Refer to the "Links" section for chest pain algorithms and additional  guidance. Performed at [facility], [address] [place], [place] [address]   Brain natriuretic peptide     Status: Abnormal   Collection Time: 12/09/2018  6:46 PM  Result Value Ref Range   B Natriuretic Peptide 389.1 (H) 0.0 - 100.0 pg/mL    Comment: Performed at [facility], [address] [place], [place] [address]  Protime-INR     Status: Abnormal   Collection Time: 08/23/2019  7:01 PM  Result Value Ref Range   Prothrombin Time 45.5 (H) 11.4 - 15.2 seconds   INR 5.0 (HH) 0.8 - 1.2    Comment: REPEATED TO VERIFY CRITICAL RESULT CALLED TO, READ BACK BY AND VERIFIED WITH: L VENEGAS RN AT 2040 ON [id number] BY K FORSYTH (NOTE) INR goal varies based on device and disease states. Performed at [facility], [address], [place], [place] [address]   Blood culture (routine x 2)     Status: None (Preliminary result)   Collection Time: 08/09/2019  9:03 PM   Specimen: BLOOD  Result Value Ref Range   Specimen Description BLOOD BLOOD LEFT FOREARM    Special Requests      BOTTLES DRAWN AEROBIC AND ANAEROBIC Blood Culture results may not be optimal due to an inadequate volume of blood received in culture bottles Performed at [facility], [address] [place], [place] [address]    Culture PENDING    Report Status PENDING   Lactic acid, plasma     Status: None   Collection Time: 08/29/2019  9:13 PM  Result Value Ref Range   Lactic Acid, Venous 1.3 0.5 - 1.9 mmol/L    Comment: Performed at [facility], [address], [place], [place] [address]  Culture, blood (single) w Reflex to ID Panel     Status: None (Preliminary result)   Collection Time: 08/14/2019  9:20 PM   Specimen: BLOOD RIGHT HAND  Result Value Ref Range   Specimen Description BLOOD RIGHT HAND    Special Requests      BOTTLES DRAWN AEROBIC ONLY Blood  Culture results may not be optimal due to an inadequate volume of blood received in culture bottles Performed at [facility], [address] [place], [place] [address]    Culture PENDING    Report Status PENDING   SARS CORONAVIRUS 2 (TAT 6-24 HRS) Nasopharyngeal Nasopharyngeal Swab     Status: None   Collection Time: 08/22/2019  9:33 PM   Specimen: Nasopharyngeal Swab  Result Value Ref Range   SARS Coronavirus 2 NEGATIVE NEGATIVE    Comment: (NOTE) SARS-CoV-2 target nucleic acids are NOT DETECTED. The SARS-CoV-2 RNA is generally detectable in upper and lower respiratory specimens during the acute phase of infection. Negative results do not preclude SARS-CoV-2 infection, do not rule out co-infections with other pathogens, and should not be used as the sole basis for treatment or other patient management decisions. Negative results must be combined with clinical observations, patient history, and epidemiological information. The expected result is Negative. Fact Sheet for Patients: HairSlick.no Fact Sheet for Healthcare Providers: quierodirigir.com This test is not yet approved or cleared by the [place] FDA and  has been authorized for detection and/or diagnosis of SARS-CoV-2 by FDA under an Emergency Use Authorization (EUA). This EUA will remain  in effect (meaning this test can be used) for the duration of the COVID-19 declaration under Section 56 4(b)(1) of the Act, 21 U.S.C. section 360bbb-3(b)(1), unless the authorization is terminated or revoked sooner. Performed at [facility], [address], [place], [place] [address]   Magnesium     Status: Abnormal   Collection  Time: 09/01/2019 10:40 PM  Result Value Ref Range   Magnesium 2.5 (H) 1.7 - 2.4 mg/dL    Comment: Performed at [facility] [address] [place], [place] [address]  Lactic acid, plasma     Status: None   Collection Time: 08/07/2019  11:34 PM  Result Value Ref Range   Lactic Acid, Venous 1.4 0.5 - 1.9 mmol/L    Comment: Performed at [facility] [address], [place], [place] [address]  CBG monitoring, ED     Status: Abnormal   Collection Time: 08/29/19  2:39 AM  Result Value Ref Range   Glucose-Capillary 168 (H) 70 - 99 mg/dL  CBC     Status: Abnormal   Collection Time: 08/29/19  3:00 AM  Result Value Ref Range   WBC 20.6 (H) 4.0 - 10.5 K/uL   RBC 3.37 (L) 4.22 - 5.81 MIL/uL   Hemoglobin 10.7 (L) 13.0 - 17.0 g/dL   HCT 31.3 (L) 39.0 - 52.0 %   MCV 92.9 80.0 - 100.0 fL   MCH 31.8 26.0 - 34.0 pg   MCHC 34.2 30.0 - 36.0 g/dL   RDW 13.4 11.5 - 15.5 %   Platelets 385 150 - 400 K/uL   nRBC 0.0 0.0 - 0.2 %    Comment: Performed at [facility], [address] [place], [place] [address]  Basic metabolic panel     Status: Abnormal   Collection Time: 08/29/19  3:00 AM  Result Value Ref Range   Sodium 132 (L) 135 - 145 mmol/L   Potassium 4.7 3.5 - 5.1 mmol/L   Chloride 97 (L) 98 - 111 mmol/L   CO2 23 22 - 32 mmol/L   Glucose, Bld 209 (H) 70 - 99 mg/dL   BUN 36 (H) 8 - 23 mg/dL   Creatinine, Ser 1.82 (H) 0.61 - 1.24 mg/dL   Calcium 8.4 (L) 8.9 - 10.3 mg/dL   GFR calc non Af Amer 37 (L) >60 mL/min   GFR calc Af Amer 43 (L) >60 mL/min   Anion gap 12 5 - 15    Comment: Performed at [facility] [address], [place], [place] [address]  Hemoglobin A1c     Status: Abnormal   Collection Time: 08/29/19  3:00 AM  Result Value Ref Range   Hgb A1c MFr Bld 9.8 (H) 4.8 - 5.6 %    Comment: (NOTE) Pre diabetes:          5.7%-6.4% Diabetes:              >6.4% Glycemic control for   <7.0% adults with diabetes    Mean Plasma Glucose 234.56 mg/dL    Comment: Performed at [facility] [address] [place], [place] [address]   Dg Chest 2 View  Result Date: 08/19/2019 CLINICAL DATA:  Sob. Pt explained that he's been out of work since 9/3 because he started experiencing emesis constantly. Pt  has been feeling progressively more sob, stating he gets out of breath even walking from room to room at home. EXAM: CHEST - 2 VIEW COMPARISON:  Chest radiograph 08/23/2019, 08/08/2019 FINDINGS: Stable cardiomediastinal contours status post median sternotomy and CABG. Central vascular could check congestion. There are bilateral increased interstitial markings similar to prior likely representing mild pulmonary edema. Small scattered linear opacities likely reflect atelectasis. No new focal infiltrate. No pneumothorax or pleural effusion. No acute finding in the visualized skeleton. IMPRESSION: Vascular congestion and diffuse  bilateral interstitial markings similar to prior likely representing mild pulmonary edema. Scattered atelectasis. No new focal infiltrate. Electronically Signed   By: Emmaline Kluver M.D.   On: 08/23/2019 17:43    Pending Labs Unresulted Labs (From admission, onward)    Start     Ordered   08/29/19 0500  Protime-INR  Daily,   R     08/29/19 0222   08/08/2019 2236  HIV Antibody  (Routine Testing)  Once,   STAT     08/27/2019 2237   08/20/2019 1955  Blood culture (routine x 2)  BLOOD CULTURE X 2,   STAT     08/11/2019 1954          Vitals/Pain Today's Vitals   08/29/19 0113 08/29/19 0145 08/29/19 0200 08/29/19 0330  BP:  (!) 134/47 (!) 127/38 (!) 117/45  Pulse:  74 68 67  Resp:  (!) 22 (!) 21 20  Temp:      TempSrc:      SpO2: 93% 93% 94% 96%  PainSc:    0-No pain    Isolation Precautions No active isolations  Medications Medications  cefTRIAXone (ROCEPHIN) 2 g in sodium chloride 0.9 % 100 mL IVPB (0 g Intravenous Stopped 08/29/19 0032)  insulin aspart (novoLOG) injection 0-9 Units (has no administration in time range)  insulin aspart (novoLOG) injection 0-5 Units (0 Units Subcutaneous Not Given 08/29/19 0321)  furosemide (LASIX) injection 20 mg (20 mg Intravenous Given 08/29/19 0256)  magnesium sulfate IVPB 1 g 100 mL (0 g Intravenous Stopped 08/29/19 0203)     Mobility walks Low fall risk   Focused Assessments Pulmonary Assessment Handoff:  Lung sounds: Bilateral Breath Sounds: Clear O2 Device: Nasal Cannula O2 Flow Rate (L/min): 3 L/min      R Recommendations: See Admitting Provider Note  Report given to:   Additional Notes:  3L O2 nasal cannula

## 2019-08-29 NOTE — Progress Notes (Signed)
PROGRESS NOTE    Patient: [name]                            PCP: Ignatius Specking, MD                    DOB: Feb 03, 1951            DOA: 08/21/2019 IWL:[id number]             DOS: 08/29/2019, 2:48 PM   LOS: 1 day   Date of Service: The patient was seen and examined on 08/29/2019  Subjective:   The patient was seen and examined this morning, on 3 L of oxygen, comfortable satting 95%.  Addendum patient is progressively getting worse now requiring 5 L of oxygen, to maintain O2 sat of 92%   Brief Narrative:  Per HPI  NASIIR MONTS is a 68 y.o. male with medical history significant of CAD status post CABG, aortic stenosis status post AVR on Coumadin, atrial fibrillation status post maze procedure, OSA noncompliant with CPAP who presented with worsening shortness of breath. Patient reports that he recently had several [facility] since April. States that he had acute kidney injury as well as pneumonia.  Never got quite better return to baseline. Reported ?  Temp, night sweats, weight loss over past few weeks.  ED evaluation: Patient was found afebrile normotensive hypoxic on room air 86%.  Rest distress.  WBC of 16.3, INR 5.0, creatinine 1.97, BNP 389, COVID screening negative Chest x-ray positive congestion diffuse bilateral EKG changes consistent with previous 1 PVCs mild ST depression Cardiology consulted.   Assessment & Plan:   Active Problems:   Congestive heart failure (CHF) (HCC)   CHF (congestive heart failure) (HCC)   Acute hypoxic respiratory failure - possibly secondary to CHF exacerbation ?? CAP, r/o emphysema/COPD -Still requiring 3 L of oxygen, to maintain O2 sat greater than 92% >>  -Patient appears euvolemic on exam but was significantly dyspneic on exam requiring 3 L via nasal cannula -Chest x-ray revealed pulmonary edema - will give 1 dose of IV 20 mg Lasix. Appreciate cardiology recs on further diuresis. -Daily intake and output,  daily weights  -Cardiology was consulted by EDP and recommends echocardiogram in the morning -Reconsulted today 08/29/2019 -Pending echo: -Mildly elevated proBNP at 546.0 -Initiating DuoNeb bronchodilator treatment, IV Solu-Medrol,  -PCCM/pulmonary consulted for further evaluation and recommendations   Fever/leukocytosis/dental abscess -Ruling out SIRS/sepsis -Afebrile, lactic acid 1.4, WBC 20.6, with 16.8% neutrophils -Nonspecific changes on chest x-ray, concerning for pneumonia. - We will continue to monitor fever curve. -Anticipating broaden antibiotics from Rocephin to cefepime - blood culture pending  AKI - likely due to hypoperfusion - continue to monitor with BMP. Would expect increase with diuresis.   History of A. Fib/ frequent PVCs  - will give one time dose of IV magnesium  - check Mg level  History of A. fib /AVR  / elevated INR -History of A. fib with Maze procedure, aortic valve replacement -On Coumadin - INR at 5.  No signs of bleeding, will hold. - Pharmacy to monitor.  Aortic stenosis status post AVR - hold Coumadin due to elevated INR   Type 2 diabetes - start low dose SSI  -Holding home medication,  insulin regimen including glipizide TRADJENTA, insulin glargine -A1c 9.8  History of coronary artery disease/CABG -We will continue home medication including aspirin, Lipitor,  Hypothyroidism -Resuming home  medication of Synthroid  OSA  -noncompliant with CPAP   Cultures; Aug 30, 2019 blood cultures x2 >>  Antimicrobials:  30-Aug-2019 IV Rocephin >>    DVT prophylaxis: SCD/Compression stockings and XT:[id number] Code Status:   Code Status: Full Code Family Communication: No family member present at bedside- attempt will be made to update daily The above findings and plan of care has been discussed with patient and family in detail,  they expressed understanding and agreement of above. Disposition Plan:   Anticipated 1-2 days Admission  status:  NPATIEN -   Consultants: Cardiology  Procedures:   No admission procedures for hospital encounter.   Antimicrobials:  Anti-infectives (From admission, onward)   Start     Dose/Rate Route Frequency Ordered Stop   08-30-19 2300  cefTRIAXone (ROCEPHIN) 2 g in sodium chloride 0.9 % 100 mL IVPB     2 g 200 mL/hr over 30 Minutes Intravenous Every 24 hours 08-30-19 2258         Medication:  . feeding supplement (ENSURE ENLIVE)  237 mL Oral BID BM  . furosemide  20 mg Intravenous Q12H  . insulin aspart  0-5 Units Subcutaneous QHS  . insulin aspart  0-9 Units Subcutaneous TID WC       Objective:   Vitals:   08/29/19 1055 08/29/19 1100 08/29/19 1130 08/29/19 1232  BP: (!) 135/53 109/76 (!) 128/51 (!) 137/58  Pulse: (!) 55 (!) 52 63 60  Resp: (!) 28 (!) 24 (!) 31 (!) 22  Temp:    (!) 97.5 F (36.4 C)  TempSrc:    Oral  SpO2: 95% 97%  95%  Weight:    77.6 kg  Height:    5\' 8"  (1.727 m)   No intake or output data in the 24 hours ending 08/29/19 1448 Filed Weights   08/29/19 1232  Weight: 77.6 kg     Examination:   Physical Exam  Constitution:  Alert, cooperative, no distress,  Appears calm and comfortable  Psychiatric: Normal and stable mood and affect, cognition intact,   HEENT: Normocephalic, PERRL, otherwise with in Normal limits  Chest:Chest symmetric Cardio vascular:  S1/S2, RRR, No murmure, No Rubs or Gallops  pulmonary: Clear to auscultation bilaterally, respirations unlabored, negative wheezes / crackles Abdomen: Soft, non-tender, non-distended, bowel sounds,no masses, no organomegaly Muscular skeletal: Limited exam - in bed, able to move all 4 extremities, Normal strength,  Neuro: CNII-XII intact. , normal motor and sensation, reflexes intact  Extremities: No pitting edema lower extremities, +2 pulses  Skin: Dry, warm to touch, negative for any Rashes, No open wounds Wounds: per nursing documentation  LABs:  CBC Latest Ref Rng & Units 08/29/2019  08/29/2019 August 30, 2019  WBC 4.0 - 10.5 K/uL 20.1(H) 20.6(H) 16.3(H)  Hemoglobin 13.0 - 17.0 g/dL 10.4(L) 10.7(L) 10.6(L)  Hematocrit 39.0 - 52.0 % 32.3(L) 31.3(L) 32.3(L)  Platelets 150 - 400 K/uL 414(H) 385 307   CMP Latest Ref Rng & Units 08/29/2019 2019/08/30 01/23/2012  Glucose 70 - 99 mg/dL 209(H) 229(H) 171(H)  BUN 8 - 23 mg/dL 36(H) 34(H) 21  Creatinine 0.61 - 1.24 mg/dL 1.82(H) 1.97(H) 1.05  Sodium 135 - 145 mmol/L 132(L) 133(L) 133(L)  Potassium 3.5 - 5.1 mmol/L 4.7 4.0 4.5  Chloride 98 - 111 mmol/L 97(L) 96(L) 98  CO2 22 - 32 mmol/L 23 24 26   Calcium 8.9 - 10.3 mg/dL 8.4(L) 8.6(L) 9.2  Total Protein 6.0 - 8.3 g/dL - - -  Total Bilirubin 0.3 - 1.2 mg/dL - - -  Alkaline  Phos 39 - 117 U/L - - -  AST 0 - 37 U/L - - -  ALT 0 - 53 U/L - - -        SIGNED: Kendell BaneSeyed A , MD, FACP, FHM. [facility],  Pager [phone]  If 7PM-7AM, please contact night-coverage [email or web address], Password [email or web address] 08/29/2019, 2:48 PM

## 2019-08-29 NOTE — Progress Notes (Signed)
PHARMACY - PHYSICIAN COMMUNICATION CRITICAL VALUE ALERT - BLOOD CULTURE IDENTIFICATION (BCID)  Results for orders placed or performed during the hospital encounter of 09/04/2019  Blood Culture ID Panel (Reflexed) (Collected: 08/30/2019  9:03 PM)  Result Value Ref Range   Enterococcus species DETECTED (A) NOT DETECTED   Vancomycin resistance NOT DETECTED NOT DETECTED   Listeria monocytogenes NOT DETECTED NOT DETECTED   Staphylococcus species NOT DETECTED NOT DETECTED   Staphylococcus aureus (BCID) NOT DETECTED NOT DETECTED   Streptococcus species NOT DETECTED NOT DETECTED   Streptococcus agalactiae NOT DETECTED NOT DETECTED   Streptococcus pneumoniae NOT DETECTED NOT DETECTED   Streptococcus pyogenes NOT DETECTED NOT DETECTED   Acinetobacter baumannii NOT DETECTED NOT DETECTED   Enterobacteriaceae species NOT DETECTED NOT DETECTED   Enterobacter cloacae complex NOT DETECTED NOT DETECTED   Escherichia coli NOT DETECTED NOT DETECTED   Klebsiella oxytoca NOT DETECTED NOT DETECTED   Klebsiella pneumoniae NOT DETECTED NOT DETECTED   Proteus species NOT DETECTED NOT DETECTED   Serratia marcescens NOT DETECTED NOT DETECTED   Haemophilus influenzae NOT DETECTED NOT DETECTED   Neisseria meningitidis NOT DETECTED NOT DETECTED   Pseudomonas aeruginosa NOT DETECTED NOT DETECTED   Candida albicans NOT DETECTED NOT DETECTED   Candida glabrata NOT DETECTED NOT DETECTED   Candida krusei NOT DETECTED NOT DETECTED   Candida parapsilosis NOT DETECTED NOT DETECTED   Candida tropicalis NOT DETECTED NOT DETECTED    Name of physician (or Provider) Contacted: Schorr NP  Changes to prescribed antibiotics required: Will change to ampicillin 2g q6 hours (adjusted for renal function) per Wilcox PharmD., BCPS Clinical Pharmacist 08/29/2019 8:33 PM

## 2019-08-29 NOTE — Progress Notes (Signed)
ANTICOAGULATION CONSULT NOTE - Initial Consult  Pharmacy Consult for Warfarin  Indication: atrial fibrillation  No Known Allergies  Vital Signs: Temp: 98.7 F (37.1 C) (09/23 1632) Temp Source: Oral (09/23 1632) BP: 127/38 (09/24 0200) Pulse Rate: 68 (09/24 0200)  Labs: Recent Labs    08/20/2019 1654 08/09/2019 1844 08/08/2019 1901  HGB 10.6*  --   --   HCT 32.3*  --   --   PLT 307  --   --   LABPROT  --   --  45.5*  INR  --   --  5.0*  CREATININE 1.97*  --   --   TROPONINIHS 103* 91*  --     Estimated Creatinine Clearance: 34.8 mL/min (A) (by C-G formula based on SCr of 1.97 mg/dL (H)).   Medical History: Past Medical History:  Diagnosis Date  . Aortic stenosis   . Atrial fibrillation ([place])    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 February 2013  . Coronary atherosclerosis of native coronary artery    2 vessel s/p CABG initial surgery in 2004, status post redo coronary bypass grafting x 3 2013  . Essential hypertension   . H/O hiatal hernia   . Hypothyroidism   . Mixed hyperlipidemia   . Myocardial infarction ([place]) 1998  . Osteoarthritis   . Pneumonia 2010  . Rotator cuff tear    Right shoulder  . Sleep apnea    wears CPAP nightly  . Type 2 diabetes mellitus [facility])     Assessment: 68 y/o M presents to the ED with shortness of breath, pt is on warfarin PTA for afib, INR is currently supra-therapeutic at 5  Goal of Therapy:  INR 2-3 Monitor platelets by anticoagulation protocol: Yes   Plan:  -Hold warfarin for now -Daily PT/INR, re-start warfarin as INR allows  Narda Bonds 08/29/2019,2:15 AM

## 2019-08-29 NOTE — Progress Notes (Signed)
  Echocardiogram 2D Echocardiogram has been performed.  [name] 08/29/2019, 12:26 PM

## 2019-08-29 NOTE — Progress Notes (Signed)
Pharmacy Antibiotic Note  [name] is a 68 y.o. male admitted on Sep 12, 2019 with shortness of breath.  Pharmacy has been consulted for cefepime dosing.  Currently afebrile, wbc elevated at 20, scr elevated at 1.8. Patient's antibiotics being escalated from ceftriaxone. Cultures no growth to date.   Plan: Cefepime 2g IV q 12 hours  Height: 5\' 8"  (172.7 cm) Weight: 171 lb 1.6 oz (77.6 kg) IBW/kg (Calculated) : 68.4  Temp (24hrs), Avg:98.1 F (36.7 C), Min:97.5 F (36.4 C), Max:98.7 F (37.1 C)  Recent Labs  Lab September 12, 2019 1654 2019/09/12 2113 12-Sep-2019 2334 08/29/19 0300 08/29/19 0811  WBC 16.3*  --   --  20.6* 20.1*  CREATININE 1.97*  --   --  1.82*  --   LATICACIDVEN  --  1.3 1.4  --   --     Estimated Creatinine Clearance: 37.1 mL/min (A) (by C-G formula based on SCr of 1.82 mg/dL (H)).    No Known Allergies   Thank you for allowing pharmacy to be a part of this patient's care.  Erin Hearing PharmD., BCPS Clinical Pharmacist 08/29/2019 3:45 PM

## 2019-08-29 NOTE — Progress Notes (Signed)
Baseline in ED was reported improved with IV lasix.

## 2019-08-29 NOTE — Consult Note (Signed)
NAME:  [name], MRN:  [id number], DOB:  Jun 15, 1951, LOS: 1 ADMISSION DATE:  08/21/2019, CONSULTATION DATE:  08/29/19  REFERRING MD:  Flossie Dibble, CHIEF COMPLAINT:  hypoxia  Brief History   68 yo admitted with hypoxic resp failure and sob  History of present illness   68 yo male with pmh CAD, AoS s/p AVR on chronic coumadin, afib s/p maze, osa (non adherent to cpap) who presented with sob that was progressively worsening. Per chart review. Pt has had multiple hospitalizations at [facility] since April due to aki/chf/pna. He states he as not quite recovered from those admission and states that over the back week his sob has been worsening. He endorses fever up to 100.4 usually at night. He endorses night sweats and dry cough. 10# wt loss but feels 2/2 decreased appetite. Denies any orthopnea/pnd. No n/v/d but endorses lower abdominal pain intermittently. Denies any exacerbating or alleviating factors.   Upon presentation yesterday to [facility], pt was afebrile and hemodynamically stable. He was hypoxic to 86% and placed on 3L with good response. He was hyponatremic and with arf. covid testing NEGATIVE and ekg wihtout acute changes. cxr with pulmonary edema.   CCM was called for hypoxia on 4L [place] with sats in low 90's. Pt was seen sitting up on side of bed, dyspneic but recovering from troip to restroom. Wife at bedside and reports that since early September pt has been declining. As noted above. She reports early fatigue and exertional dyspnea. She states that she does hear him breathing heavy while sleeping, describing a gasping for air sound when he sleeps. He states that he does lay flat but does not like to because of his breathing (makes it hard) so he tried to sleep on his side or sitting up. Pt almost describes PND symptoms that his laying flat will cause him to wake up and he is coughing or "needing more air", but again does endorse nightsweats with periodic fevers.   Past Medical History    Past Medical History:  Diagnosis Date  . Aortic stenosis   . Atrial fibrillation (H[facility])    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 February 2013  . CHF (congestive heart failure) (H[facility])   . Coronary atherosclerosis of native coronary artery    2 vessel s/p CABG initial surgery in 2004, status post redo coronary bypass grafting x 3 2013  . Essential hypertension   . H/O hiatal hernia   . Hypothyroidism   . Mixed hyperlipidemia   . Myocardial infarction (H[facility]) 1998  . Osteoarthritis   . Pneumonia 2010  . Rotator cuff tear    Right shoulder  . Sleep apnea    wears CPAP nightly  . Type 2 diabetes mellitus (H[facility])     Significant Hospital Events     Consults:  Cardiology 9/23  Procedures:    Significant Diagnostic Tests:  9/24 echo: LVEF 50-55%, rv with some reduced ef.   Micro Data:  9/23 blood: ngtd 9/23 sars2: neg  Antimicrobials:  Cefepime 9/24->  Interim history/subjective:  9/24: on 4L [place], sats at 94% while sitting up on side of bed.   Objective   Blood pressure (!) 137/58, pulse 60, temperature (!) 97.5 F (36.4 C), temperature source Oral, resp. rate (!) 22, height 5\' 8"  (1.727 m), weight 77.6 kg, SpO2 95 %.       No intake or output data in the 24 hours ending 08/29/19 1524 Filed Weights   08/29/19 1232  Weight: 77.6 kg  Examination: General: no acute distress, appears chronically ill, mildly dyspneic  HEENT: NCAT, EOMI, PERRLA, MMMP Lungs: +rales bilaterally Cardiovascular: irreg irreg Abdomen: soft, NT,ND, BS+ Extremities: no edema Skin: no rashes, warm and dry Neuro: moves all 4 extremities   Resolved Hospital Problem list     Assessment & Plan:  Acute hypoxic resp failure:   -on 4L [place] at this time.  -titrate as needed -goal sat >92% -diurese as tolerated with pulm edema on cxr and mildly elevated bnp -agree with ctpa for pe r/o and also better visualization -empiric abx for now but low threshold to d/c based on cx data  (awaiting blood cx with ? Endocarditis), no infiltrate noted for pneumonia but ct may elucidate septic emboli not appreciated on cxr -multiple negative sars2 -send rvp for completeness sake -steroids likely of little value at this time.   Sepsis:  -?endocarditis with fever/wbc and mobile mass on TV -Blood cx pending -abx ongoing  Sob/dyspnea:  -Suspect cardiac in origin -Appreciate cards -Tee tomorrow -Maintain a/c once INR down from 5  TV thrombus:  TEE per cards  Hyponatremia DM2 with hyperglycemia Normocytic anemia Coagulopathy afib Ao Stenosis s/p AVR 7 years ago on chronic a/c (holding for INR 5) HFpEF with acute decompensation  -diurese as able  Goals of care:  Did discuss at length with pt and his wife. Challenging as pt's wife discloses that she does not like to talk about it but does understand that it is a necessary conversation. Pt is clear he would want to be intubated for short period if able to recover. He would want to be FULL code but only one round and if he were to code again he would want to be let go peacefully. I encouraged them to have a more open conversation abotut this as the pt wife was appropriately emotional with the challenging topic. Nonetheless he remains   Best practice:  Diet: per primary Pain/Anxiety/Delirium protocol (if indicated): n/a VAP protocol (if indicated): n/a DVT prophylaxis: INR 5 GI prophylaxis: n/a Glucose control: ssi Mobility: ad lib Code Status: full Family Communication: wife at bedside  Disposition: floor, if resp status worsens would recommend transfer to ICU or at least progressive  Labs   CBC: Recent Labs  Lab 08/12/2019 1654 08/29/19 0300 08/29/19 0811  WBC 16.3* 20.6* 20.1*  NEUTROABS  --   --  16.8*  HGB 10.6* 10.7* 10.4*  HCT 32.3* 31.3* 32.3*  MCV 93.6 92.9 94.2  PLT 307 385 414*    Basic Metabolic Panel: Recent Labs  Lab 08/21/2019 1654 09/03/2019 2240 08/29/19 0300  NA 133*  --  132*  K 4.0  --   4.7  CL 96*  --  97*  CO2 24  --  23  GLUCOSE 229*  --  209*  BUN 34*  --  36*  CREATININE 1.97*  --  1.82*  CALCIUM 8.6*  --  8.4*  MG  --  2.5*  --    GFR: Estimated Creatinine Clearance: 37.1 mL/min (A) (by C-G formula based on SCr of 1.82 mg/dL (H)). Recent Labs  Lab 08/19/2019 1654 08/10/2019 2113 08/10/2019 2334 08/29/19 0300 08/29/19 0811  WBC 16.3*  --   --  20.6* 20.1*  LATICACIDVEN  --  1.3 1.4  --   --     Liver Function Tests: No results for input(s): AST, ALT, ALKPHOS, BILITOT, PROT, ALBUMIN in the last 168 hours. No results for input(s): LIPASE, AMYLASE in the last 168 hours. No results  for input(s): AMMONIA in the last 168 hours.  ABG    Component Value Date/Time   PHART 7.353 01/12/2012 2257   PCO2ART 38.7 01/12/2012 2257   PO2ART 64.0 (L) 01/12/2012 2257   HCO3 21.4 01/12/2012 2257   TCO2 24 01/13/2012 1716   ACIDBASEDEF 4.0 (H) 01/12/2012 2257   O2SAT 91.0 01/12/2012 2257     Coagulation Profile: Recent Labs  Lab 2019-09-24 1901 08/29/19 0300  INR 5.0* 5.0*    Cardiac Enzymes: No results for input(s): CKTOTAL, CKMB, CKMBINDEX, TROPONINI in the last 168 hours.  HbA1C: Hgb A1c MFr Bld  Date/Time Value Ref Range Status  08/29/2019 03:00 AM 9.8 (H) 4.8 - 5.6 % Final    Comment:    (NOTE) Pre diabetes:          5.7%-6.4% Diabetes:              >6.4% Glycemic control for   <7.0% adults with diabetes   01/09/2012 01:24 PM 6.9 (H) <5.7 % Final    Comment:    (NOTE)                                                                       According to the ADA Clinical Practice Recommendations for 2011, when HbA1c is used as a screening test:  >=6.5%   Diagnostic of Diabetes Mellitus           (if abnormal result is confirmed) 5.7-6.4%   Increased risk of developing Diabetes Mellitus References:Diagnosis and Classification of Diabetes Mellitus,Diabetes WEXH,3716,96(VELFY 1):S62-S69 and Standards of Medical Care in         Diabetes - 2011,Diabetes  BOFB,5102,58 (Suppl 1):S11-S61.    CBG: Recent Labs  Lab 08/29/19 0239 08/29/19 0742 08/29/19 1248  GLUCAP 168* 182* 194*    Review of Systems:   Sob, fatigue, night sweats (as hpi) All other ROS negative  Past Medical History  He,  has a past medical history of Aortic stenosis, Atrial fibrillation ([place]), CHF (congestive heart failure) ([place]), Coronary atherosclerosis of native coronary artery, Essential hypertension, H/O hiatal hernia, Hypothyroidism, Mixed hyperlipidemia, Myocardial infarction ([place]) (1998), Osteoarthritis, Pneumonia (2010), Rotator cuff tear, Sleep apnea, and Type 2 diabetes mellitus ([place]).   Surgical History    Past Surgical History:  Procedure Laterality Date  . AORTIC VALVE REPLACEMENT  01/12/2012   Procedure: AORTIC VALVE REPLACEMENT (AVR);  Surgeon: Rexene Alberts, MD;  Location: [place];  Service: Open Heart Surgery;  Laterality: N/A;  NO NECKLINE ON THE RIGHT  . APPENDECTOMY    . CARDIAC CATHETERIZATION  12/21/2011  . CARDIOVERSION  11/18/2011  . CHOLECYSTECTOMY    . CORONARY ARTERY BYPASS GRAFT  04/02/2003   Dr. Roxy Manns - SVG to OM, composite SVG/RIMA to PDA  . CORONARY ARTERY BYPASS GRAFT  01/12/2012   Procedure: REDO CORONARY ARTERY BYPASS GRAFTING (CABG);  Surgeon: Rexene Alberts, MD;  Location: [place];  Service: Open Heart Surgery;  Laterality: N/A;  Three grafts; Endoscopically harvested left saphenous vein graft and left internal mammary artery  . LEFT AND RIGHT HEART CATHETERIZATION WITH CORONARY ANGIOGRAM N/A 12/21/2011   Procedure: LEFT AND RIGHT HEART CATHETERIZATION WITH CORONARY ANGIOGRAM;  Surgeon: Wellington Hampshire, MD;  Location: [place] CATH LAB;  Service: Cardiovascular;  Laterality: N/A;  . MAZE  01/12/2012   Procedure: MAZE;  Surgeon: Purcell Nails, MD;  Location: [facility];  Service: Open Heart Surgery;  Laterality: N/A;  NO NECKLINE ON THE RIGHT  . TONSILLECTOMY       Social History   reports that he quit smoking about 36 years ago. His smoking use  included cigarettes. He has a 42.00 pack-year smoking history. He has never used smokeless tobacco. He reports that he does not drink alcohol or use drugs.   Family History   His family history includes Cancer in his father.   Allergies No Known Allergies   Home Medications  Prior to Admission medications   Medication Sig Start Date End Date Taking? Authorizing Provider  albuterol (PROAIR HFA) 108 (90 Base) MCG/ACT inhaler Inhale 1-2 puffs into the lungs as needed for wheezing or shortness of breath. 08/22/19  Yes Jonelle Sidle, MD  allopurinol (ZYLOPRIM) 300 MG tablet Take 300 mg by mouth daily.   Yes [provider]  amLODipine (NORVASC) 5 MG tablet Take 1 tablet (5 mg total) by mouth daily. 10/22/12  Yes Jonelle Sidle, MD  amoxicillin-clavulanate (AUGMENTIN) 500-125 MG tablet Take 1 tablet by mouth 2 (two) times daily. 08/23/19  Yes [provider]  aspirin 81 MG tablet Take 81 mg by mouth daily.   Yes [provider]  atorvastatin (LIPITOR) 20 MG tablet Take 1 tablet (20 mg total) by mouth daily. 10/22/12  Yes Jonelle Sidle, MD  furosemide (LASIX) 40 MG tablet Take 1 tablet (40 mg total) by mouth daily. 10/22/12  Yes Jonelle Sidle, MD  glipiZIDE (GLUCOTROL) 10 MG tablet Take 1 tablet (10 mg total) by mouth 2 (two) times daily before a meal. 08/22/19  Yes Jonelle Sidle, MD  Insulin Glargine, 1 Unit Dial, (TOUJEO SOLOSTAR) 300 UNIT/ML SOPN Inject 10 Units into the skin daily. Patient taking differently: Inject 10 Units into the skin at bedtime.  08/22/19  Yes Jonelle Sidle, MD  iron polysaccharides (NIFEREX) 150 MG capsule Take 150 mg by mouth daily.   Yes [provider]  levothyroxine (SYNTHROID, LEVOTHROID) 75 MCG tablet Take 75 mcg by mouth daily.   Yes [provider]  linagliptin (TRADJENTA) 5 MG TABS tablet Take 1 tablet (5 mg total) by mouth daily. 08/22/19  Yes Jonelle Sidle, MD  nitroGLYCERIN (NITROSTAT)  0.4 MG SL tablet Place 0.4 mg under the tongue every 5 (five) minutes as needed. For chest pain   Yes [provider]  Tamsulosin HCl (FLOMAX) 0.4 MG CAPS Take 0.4 mg by mouth daily.    Yes [provider]  warfarin (COUMADIN) 5 MG tablet Take 0.5 tab (2.5 mg) daily or as directed by [facility] Patient taking differently: Take 2.5-5 mg by mouth See admin instructions. Take  on TUE and FRI, and 2.5mg  on all other days. 01/16/12  Yes Wilmon Pali, PA-C     Critical care time: The patient is critically ill with multiple organ systems failure and requires high complexity decision making for assessment and support, frequent evaluation and titration of therapies, application of advanced monitoring technologies and extensive interpretation of multiple databases.  Critical care time 37 mins. This represents my time independent of the NP's/PA's/med students/residents time taking care of the pt. This is excluding procedures.     Briant Sites DO Pager: [phone] After hours pager: [phone]  [facility] 08/29/2019, 3:24 PM

## 2019-08-29 NOTE — ED Notes (Signed)
Lunch Tray Ordered @ 1044.  

## 2019-08-29 NOTE — Plan of Care (Signed)
Patient newly admitted with in improvement in SOB at rest with tachypnea on exertion. Plan to continue IV lasix, antibiotics, and wean 02.

## 2019-08-29 NOTE — ED Notes (Signed)
Patient's wife called and updated on patient's status.

## 2019-08-30 ENCOUNTER — Inpatient Hospital Stay (HOSPITAL_COMMUNITY): Payer: BC Managed Care – PPO

## 2019-08-30 ENCOUNTER — Other Ambulatory Visit (HOSPITAL_COMMUNITY): Payer: BC Managed Care – PPO

## 2019-08-30 DIAGNOSIS — R7881 Bacteremia: Secondary | ICD-10-CM

## 2019-08-30 DIAGNOSIS — I4891 Unspecified atrial fibrillation: Secondary | ICD-10-CM

## 2019-08-30 DIAGNOSIS — J9601 Acute respiratory failure with hypoxia: Secondary | ICD-10-CM

## 2019-08-30 DIAGNOSIS — I2510 Atherosclerotic heart disease of native coronary artery without angina pectoris: Secondary | ICD-10-CM

## 2019-08-30 DIAGNOSIS — Z951 Presence of aortocoronary bypass graft: Secondary | ICD-10-CM

## 2019-08-30 DIAGNOSIS — R6521 Severe sepsis with septic shock: Secondary | ICD-10-CM

## 2019-08-30 DIAGNOSIS — T826XXA Infection and inflammatory reaction due to cardiac valve prosthesis, initial encounter: Secondary | ICD-10-CM

## 2019-08-30 DIAGNOSIS — K047 Periapical abscess without sinus: Secondary | ICD-10-CM

## 2019-08-30 DIAGNOSIS — R0902 Hypoxemia: Secondary | ICD-10-CM

## 2019-08-30 DIAGNOSIS — I330 Acute and subacute infective endocarditis: Secondary | ICD-10-CM

## 2019-08-30 DIAGNOSIS — N179 Acute kidney failure, unspecified: Secondary | ICD-10-CM

## 2019-08-30 DIAGNOSIS — R011 Cardiac murmur, unspecified: Secondary | ICD-10-CM

## 2019-08-30 DIAGNOSIS — Z87891 Personal history of nicotine dependence: Secondary | ICD-10-CM

## 2019-08-30 DIAGNOSIS — G4730 Sleep apnea, unspecified: Secondary | ICD-10-CM

## 2019-08-30 DIAGNOSIS — B952 Enterococcus as the cause of diseases classified elsewhere: Secondary | ICD-10-CM

## 2019-08-30 DIAGNOSIS — E119 Type 2 diabetes mellitus without complications: Secondary | ICD-10-CM

## 2019-08-30 LAB — RESPIRATORY PANEL BY PCR

## 2019-08-30 LAB — CBC
HCT: 29.5 % — ABNORMAL LOW (ref 39.0–52.0)
Hemoglobin: 9.8 g/dL — ABNORMAL LOW (ref 13.0–17.0)
MCH: 30.4 pg (ref 26.0–34.0)
MCHC: 33.2 g/dL (ref 30.0–36.0)
MCV: 91.6 fL (ref 80.0–100.0)
Platelets: 415 10*3/uL — ABNORMAL HIGH (ref 150–400)
RBC: 3.22 MIL/uL — ABNORMAL LOW (ref 4.22–5.81)
RDW: 13.3 % (ref 11.5–15.5)
WBC: 17.1 10*3/uL — ABNORMAL HIGH (ref 4.0–10.5)
nRBC: 0 % (ref 0.0–0.2)

## 2019-08-30 LAB — BASIC METABOLIC PANEL
Anion gap: 14 (ref 5–15)
Anion gap: 19 — ABNORMAL HIGH (ref 5–15)
BUN: 59 mg/dL — ABNORMAL HIGH (ref 8–23)
BUN: 74 mg/dL — ABNORMAL HIGH (ref 8–23)
CO2: 22 mmol/L (ref 22–32)
CO2: 17 mmol/L — ABNORMAL LOW (ref 22–32)
Calcium: 8.9 mg/dL (ref 8.9–10.3)
Calcium: 8.8 mg/dL — ABNORMAL LOW (ref 8.9–10.3)
Chloride: 97 mmol/L — ABNORMAL LOW (ref 98–111)
Chloride: 95 mmol/L — ABNORMAL LOW (ref 98–111)
Creatinine, Ser: 2.61 mg/dL — ABNORMAL HIGH (ref 0.61–1.24)
Creatinine, Ser: 3.25 mg/dL — ABNORMAL HIGH (ref 0.61–1.24)
GFR calc Af Amer: 28 mL/min — ABNORMAL LOW (ref >60)
GFR calc Af Amer: 21 mL/min — ABNORMAL LOW (ref >60)
GFR calc non Af Amer: 24 mL/min — ABNORMAL LOW (ref >60)
GFR calc non Af Amer: 18 mL/min — ABNORMAL LOW (ref >60)
Glucose, Bld: 343 mg/dL — ABNORMAL HIGH (ref 70–99)
Glucose, Bld: 358 mg/dL — ABNORMAL HIGH (ref 70–99)
Potassium: 4.7 mmol/L (ref 3.5–5.1)
Potassium: 5.8 mmol/L — ABNORMAL HIGH (ref 3.5–5.1)
Sodium: 133 mmol/L — ABNORMAL LOW (ref 135–145)
Sodium: 131 mmol/L — ABNORMAL LOW (ref 135–145)

## 2019-08-30 LAB — COMPREHENSIVE METABOLIC PANEL
ALT: 35 U/L (ref 0–44)
AST: 30 U/L (ref 15–41)
Albumin: 2.6 g/dL — ABNORMAL LOW (ref 3.5–5.0)
Alkaline Phosphatase: 98 U/L (ref 38–126)
Anion gap: 10 (ref 5–15)
BUN: 53 mg/dL — ABNORMAL HIGH (ref 8–23)
CO2: 25 mmol/L (ref 22–32)
Calcium: 8.7 mg/dL — ABNORMAL LOW (ref 8.9–10.3)
Chloride: 96 mmol/L — ABNORMAL LOW (ref 98–111)
Creatinine, Ser: 2.31 mg/dL — ABNORMAL HIGH (ref 0.61–1.24)
GFR calc Af Amer: 32 mL/min — ABNORMAL LOW (ref >60)
GFR calc non Af Amer: 28 mL/min — ABNORMAL LOW (ref >60)
Glucose, Bld: 383 mg/dL — ABNORMAL HIGH (ref 70–99)
Potassium: 5 mmol/L (ref 3.5–5.1)
Sodium: 131 mmol/L — ABNORMAL LOW (ref 135–145)
Total Bilirubin: 1 mg/dL (ref 0.3–1.2)
Total Protein: 6.5 g/dL (ref 6.5–8.1)

## 2019-08-30 LAB — GLUCOSE, CAPILLARY
Glucose-Capillary: 409 mg/dL — ABNORMAL HIGH (ref 70–99)
Glucose-Capillary: 392 mg/dL — ABNORMAL HIGH (ref 70–99)
Glucose-Capillary: 381 mg/dL — ABNORMAL HIGH (ref 70–99)
Glucose-Capillary: 192 mg/dL — ABNORMAL HIGH (ref 70–99)

## 2019-08-30 LAB — LIPID PANEL
Cholesterol: 106 mg/dL (ref 0–200)
HDL: 24 mg/dL — ABNORMAL LOW (ref >40)
LDL Cholesterol: 65 mg/dL (ref 0–99)
Total CHOL/HDL Ratio: 4.4 RATIO
Triglycerides: 85 mg/dL (ref <150)
VLDL: 17 mg/dL (ref 0–40)

## 2019-08-30 LAB — PROTIME-INR
INR: 4.9 (ref 0.8–1.2)
Prothrombin Time: 44.9 seconds — ABNORMAL HIGH (ref 11.4–15.2)

## 2019-08-30 LAB — LACTATE DEHYDROGENASE: LDH: 328 U/L — ABNORMAL HIGH (ref 98–192)

## 2019-08-30 LAB — PROLACTIN: Prolactin: 12.1 ng/mL (ref 4.0–15.2)

## 2019-08-30 LAB — PROCALCITONIN: Procalcitonin: 0.4 ng/mL

## 2019-08-30 LAB — GLUCOSE, RANDOM: Glucose, Bld: 364 mg/dL — ABNORMAL HIGH (ref 70–99)

## 2019-08-30 MED ORDER — INSULIN ASPART 100 UNIT/ML ~~LOC~~ SOLN: 0.0000 [IU] | Freq: Every day | SUBCUTANEOUS | Status: DC

## 2019-08-30 MED ORDER — INSULIN GLARGINE 100 UNIT/ML ~~LOC~~ SOLN
10.0000 [IU] | Freq: Two times a day (BID) | SUBCUTANEOUS | Status: DC
  Filled 2019-08-30: qty 0.1

## 2019-08-30 MED ORDER — GLUCERNA SHAKE PO LIQD
237.0000 mL | Freq: Three times a day (TID) | ORAL | Status: DC
  Administered 2019-08-30 – 2019-08-31 (×2): 237 mL via ORAL

## 2019-08-30 MED ORDER — IPRATROPIUM-ALBUTEROL 0.5-2.5 (3) MG/3ML IN SOLN
3.0000 mL | RESPIRATORY_TRACT | Status: DC | PRN
  Administered 2019-08-31: 3 mL via RESPIRATORY_TRACT
  Filled 2019-08-30: qty 3

## 2019-08-30 MED ORDER — SODIUM CHLORIDE 0.9 % IV SOLN
2.0000 g | Freq: Three times a day (TID) | INTRAVENOUS | Status: DC
  Administered 2019-08-30 – 2019-09-02 (×8): 2 g via INTRAVENOUS
  Filled 2019-08-30 (×2): qty 2
  Filled 2019-08-30 (×2): qty 2000
  Filled 2019-08-30: qty 2
  Filled 2019-08-30 (×2): qty 2000
  Filled 2019-08-30 (×4): qty 2
  Filled 2019-08-30: qty 2000
  Filled 2019-08-30: qty 2

## 2019-08-30 MED ORDER — INSULIN ASPART 100 UNIT/ML ~~LOC~~ SOLN: 3.0000 [IU] | Freq: Three times a day (TID) | SUBCUTANEOUS | Status: DC

## 2019-08-30 MED ORDER — INSULIN ASPART 100 UNIT/ML ~~LOC~~ SOLN
0.0000 [IU] | Freq: Three times a day (TID) | SUBCUTANEOUS | Status: DC
  Administered 2019-08-30 (×2): 20 [IU] via SUBCUTANEOUS
  Administered 2019-08-31 (×2): 7 [IU] via SUBCUTANEOUS
  Administered 2019-08-31: 15 [IU] via SUBCUTANEOUS

## 2019-08-30 MED ORDER — SODIUM CHLORIDE 0.9 % IV SOLN
2.0000 g | Freq: Two times a day (BID) | INTRAVENOUS | Status: DC
  Administered 2019-08-30 – 2019-09-01 (×6): 2 g via INTRAVENOUS
  Filled 2019-08-30: qty 20
  Filled 2019-08-30: qty 2
  Filled 2019-08-30: qty 20
  Filled 2019-08-30 (×4): qty 2
  Filled 2019-08-30 (×2): qty 20

## 2019-08-30 MED ORDER — FUROSEMIDE 10 MG/ML IJ SOLN
80.0000 mg | Freq: Once | INTRAMUSCULAR | Status: AC
  Administered 2019-08-30: 80 mg via INTRAVENOUS
  Filled 2019-08-30: qty 8

## 2019-08-30 MED ORDER — INSULIN GLARGINE 100 UNIT/ML ~~LOC~~ SOLN
10.0000 [IU] | Freq: Two times a day (BID) | SUBCUTANEOUS | Status: DC
  Administered 2019-08-30 – 2019-09-01 (×5): 10 [IU] via SUBCUTANEOUS
  Filled 2019-08-30 (×8): qty 0.1

## 2019-08-30 MED ORDER — INSULIN GLARGINE 100 UNIT/ML ~~LOC~~ SOLN
10.0000 [IU] | Freq: Every day | SUBCUTANEOUS | Status: DC
  Administered 2019-08-30: 10 [IU] via SUBCUTANEOUS
  Filled 2019-08-30: qty 0.1

## 2019-08-30 MED ORDER — ADULT MULTIVITAMIN W/MINERALS CH
1.0000 | ORAL_TABLET | Freq: Every day | ORAL | Status: DC
  Administered 2019-08-30 – 2019-09-01 (×3): 1 via ORAL
  Filled 2019-08-30 (×2): qty 1

## 2019-08-30 NOTE — Consult Note (Signed)
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  He has multiple other medical problems in addition to his bacteremia including likely acute kidney injury due to hypoperfusion and a dental abscess.  He is currently requiring 3 L of oxygen via nasal cannula for acute hypoxic respiratory failure with concern for possible CHF exacerbation.  Continue current dose of ampicillin and will add ceftriaxone for coverage of potential tricuspid valve endocarditis.  Anticipate adequate coverage with ceftriaxone and ampicillin for the dental abscess.  He is in the high risk age category for hepatitis C and will test hepatitis C antibody.   PLAN:  1. Continue current dose of ampicillin and start ceftriaxone. 2.  Await TEE results for confirmation of endocarditis. 3.  Check Hepatitis C antibody for routine screening. 4.  CHF per primary team and cardiology.     Active Problems:   Congestive heart failure (CHF) (HCC)   CHF (congestive heart failure) (HCC)   . allopurinol  300 mg Oral Daily  . amLODipine  5 mg Oral Daily  . aspirin  81 mg Oral Daily  . atorvastatin  20 mg Oral Daily  . feeding supplement (ENSURE ENLIVE)  237 mL Oral BID BM  . furosemide  20 mg Intravenous Q12H  . insulin aspart  0-5 Units Subcutaneous QHS  . insulin aspart  0-9 Units Subcutaneous TID WC  . insulin glargine  10 Units Subcutaneous QHS  . iron polysaccharides  150 mg Oral Daily  . levothyroxine  75 mcg Oral Q0600  . methylPREDNISolone (SOLU-MEDROL) injection  40 mg Intravenous Q6H  .  tamsulosin  0.4 mg Oral Daily     HPI: [name] is a 68 y.o. male with previous medical history of type 2 diabetes, sleep apnea, coronary artery disease with native valve status post two-vessel CABG in 2004 and redo three-vessel CABG in 2013, atrial fibrillation, and aortic valve replacement in 2013 admiteed with progressively worsening shortness of breath and tachypnea over the course of the past month and had fever at home of 101.   Afebrile in the ED with WBC count of 16.3, creatinine 1.97, and hemoglobin of 10.6.  Chest x-ray with vascular congestion and diffuse bilateral interstitial markings with no new focal infiltrates.  CT scan of the chest with multifocal bilateral groundglass opacities favoring pulmonary edema and small bilateral pleural effusions and fluid in the fissures.  [name] has been afebrile since admission with leukocytosis with most recent white blood cell count of 17.1.  Blood cultures drawn on admission now positive for enterococcus species in 1/4 bottles.  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  Antimicrobial therapy initially with ceftriaxone and cefepime and narrowed to ampicillin with identification of enterococcus species bacteremia.  Unable to complete TEE today and rescheduled for 9/28.  Repeat blood cultures have been ordered and are in process.  [name] has had  a dental abscess going on for several months now and his care has been delayed due to coronavirus restrictions. He was recently started on Augmentin by his primary care provider prior to admission with his most recent dose being the day prior to admission about 3 days ago. He has had on and off chills and weight loss of about 10 pounds over the past 1-2 weeks with increasing shortness of breath.   Review of Systems: Review of Systems   Constitutional: Positive for malaise/fatigue. Negative for chills, fever and weight loss.  Respiratory: Positive for cough and shortness of breath. Negative for wheezing.   Cardiovascular: Negative for chest pain and leg swelling.  Gastrointestinal: Negative for abdominal pain, constipation, diarrhea, nausea and vomiting.  Skin: Negative for rash.     Past Medical History:  Diagnosis Date  . Aortic stenosis   . Atrial fibrillation (HCC)    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 February 2013  . CHF (congestive heart failure) (HCC)   . Coronary atherosclerosis of native coronary artery    2 vessel s/p CABG initial surgery in 2004, status post redo coronary bypass grafting x 3 2013  . Essential hypertension   . H/O hiatal hernia   . Hypothyroidism   . Mixed hyperlipidemia   . Myocardial infarction (HCC) 1998  . Osteoarthritis   . Pneumonia 2010  . Rotator cuff tear    Right shoulder  . Sleep apnea    wears CPAP nightly  . Type 2 diabetes mellitus (HCC)     Social History   Tobacco Use  . Smoking status: Former Smoker    Packs/day: 3.00    Years: 14.00    Pack years: 42.00    Types: Cigarettes    Quit date: 12/05/1982    Years since quitting: 36.7  . Smokeless tobacco: Never Used  Substance Use Topics  . Alcohol use: No  . Drug use: No    Family History  Problem Relation Age of Onset  . Cancer Father     No Known Allergies  OBJECTIVE: Blood pressure (!) 118/40, pulse 61, temperature 98.3 F (36.8 C), temperature source Oral, resp. rate (!) 22, height  (1.727 m), weight 77.6 kg, SpO2 94 %.  Physical Exam Constitutional:      General: He is not in acute distress.    Appearance: He is well-developed. He is ill-appearing.     Interventions: Nasal cannula in place.     Comments: Seated in the chair, eating breakfast; pleasant.   Cardiovascular:     Rate and Rhythm: Normal rate and regular rhythm.     Heart sounds: Murmur present.   Pulmonary:     Effort: Pulmonary effort is normal. No respiratory distress.     Breath sounds: No stridor. Rales present. No wheezing or rhonchi.  Skin:    General: Skin is warm and dry.  Neurological:     Mental Status: He is alert and oriented to person, place, and time.  Psychiatric:        Behavior: Behavior normal.        Thought Content: Thought content normal.        Judgment: Judgment normal.     Lab Results Lab Results  Component Value Date   WBC 17.1 (H) 08/30/2019   HGB 9.8 (L) 08/30/2019   HCT 29.5 (L) 08/30/2019   MCV 91.6 08/30/2019   PLT 415 (H) 08/30/2019    Lab Results  Component Value Date   CREATININE 2.31 (H)  08/30/2019   BUN 53 (H) 08/30/2019   NA 131 (L) 08/30/2019   K 5.0 08/30/2019   CL 96 (L) 08/30/2019   CO2 25 08/30/2019    Lab Results  Component Value Date   ALT 35 08/30/2019   AST 30 08/30/2019   ALKPHOS 98 08/30/2019   BILITOT 1.0 08/30/2019     Microbiology: Recent Results (from the past 240 hour(s))  Blood culture (routine x 2)     Status: None (Preliminary result)   Collection Time: 2019/02/23  9:00 PM   Specimen: BLOOD  Result Value Ref Range Status   Specimen Description BLOOD RIGHT ANTECUBITAL  Final   Special Requests   Final    BOTTLES DRAWN AEROBIC AND ANAEROBIC Blood Culture results may not be optimal due to an inadequate volume of blood received in culture bottles   Culture   Final    NO GROWTH < 24 HOURS Performed at [facility], [address] [place], [place] [address]    Report Status PENDING  Incomplete  Blood culture (routine x 2)     Status: None (Preliminary result)   Collection Time: 2019/02/23  9:03 PM   Specimen: BLOOD  Result Value Ref Range Status   Specimen Description BLOOD BLOOD LEFT FOREARM  Final   Special Requests   Final    BOTTLES DRAWN AEROBIC AND ANAEROBIC Blood Culture results may not be optimal due to an inadequate volume of blood received in culture bottles   Culture  Setup Time    Final    GRAM POSITIVE COCCI AEROBIC BOTTLE ONLY Organism ID to follow CRITICAL RESULT CALLED TO, READ BACK BY AND VERIFIED WITH: Kelby FamF WILSON [facility] 08/29/19 2025 JDW Performed at [facility], [address] [place], [place] [address]    Culture GRAM POSITIVE COCCI  Final   Report Status PENDING  Incomplete  Blood Culture ID Panel (Reflexed)     Status: Abnormal   Collection Time: 2019/02/23  9:03 PM  Result Value Ref Range Status   Enterococcus species DETECTED (A) NOT DETECTED Final    Comment: CRITICAL RESULT CALLED TO, READ BACK BY AND VERIFIED WITH: F WILSON PHARMD 08/29/19 2025 JDW    Vancomycin resistance NOT DETECTED NOT DETECTED Final   Listeria monocytogenes NOT DETECTED NOT DETECTED Final   Staphylococcus species NOT DETECTED NOT DETECTED Final   Staphylococcus aureus (BCID) NOT DETECTED NOT DETECTED Final   Streptococcus species NOT DETECTED NOT DETECTED Final   Streptococcus agalactiae NOT DETECTED NOT DETECTED Final   Streptococcus pneumoniae NOT DETECTED NOT DETECTED Final   Streptococcus pyogenes NOT DETECTED NOT DETECTED Final   Acinetobacter baumannii NOT DETECTED NOT DETECTED Final   Enterobacteriaceae species NOT DETECTED NOT DETECTED Final   Enterobacter cloacae complex NOT DETECTED NOT DETECTED Final   Escherichia coli NOT DETECTED NOT DETECTED Final   Klebsiella oxytoca NOT DETECTED NOT DETECTED Final   Klebsiella pneumoniae NOT DETECTED NOT DETECTED Final   Proteus species NOT DETECTED NOT DETECTED Final   Serratia marcescens NOT DETECTED NOT DETECTED Final   Haemophilus influenzae NOT DETECTED NOT DETECTED Final   Neisseria meningitidis NOT DETECTED NOT DETECTED Final   Pseudomonas aeruginosa NOT DETECTED NOT DETECTED Final   Candida albicans NOT DETECTED NOT DETECTED Final   Candida glabrata NOT DETECTED NOT DETECTED Final   Candida krusei NOT DETECTED NOT DETECTED Final   Candida parapsilosis NOT DETECTED NOT DETECTED Final   Candida tropicalis NOT  DETECTED NOT DETECTED Final  Comment: Performed at [facility], [place] [address] [place], [place] [address]  Culture, blood (single) w Reflex to ID Panel     Status: None (Preliminary result)   Collection Time: 09-04-19  9:20 PM   Specimen: BLOOD RIGHT HAND  Result Value Ref Range Status   Specimen Description BLOOD RIGHT HAND  Final   Special Requests   Final    BOTTLES DRAWN AEROBIC ONLY Blood Culture results may not be optimal due to an inadequate volume of blood received in culture bottles   Culture   Final    NO GROWTH < 24 HOURS Performed at [facility], [place] [address] [place], [place] [address]    Report Status PENDING  Incomplete  SARS CORONAVIRUS 2 (TAT 6-24 HRS) Nasopharyngeal Nasopharyngeal Swab     Status: None   Collection Time: 09-04-2019  9:33 PM   Specimen: Nasopharyngeal Swab  Result Value Ref Range Status   SARS Coronavirus 2 NEGATIVE NEGATIVE Final    Comment: (NOTE) SARS-CoV-2 target nucleic acids are NOT DETECTED. The SARS-CoV-2 RNA is generally detectable in upper and lower respiratory specimens during the acute phase of infection. Negative results do not preclude SARS-CoV-2 infection, do not rule out co-infections with other pathogens, and should not be used as the sole basis for treatment or other patient management decisions. Negative results must be combined with clinical observations, patient history, and epidemiological information. The expected result is Negative. Fact Sheet for Patients: SugarRoll.be Fact Sheet for Healthcare Providers: https://www.woods-mathews.com/ This test is not yet approved or cleared by the [place] FDA and  has been authorized for detection and/or diagnosis of SARS-CoV-2 by FDA under an Emergency Use Authorization (EUA). This EUA will remain  in effect (meaning this test can be used) for the duration of the COVID-19 declaration under Section 56 4(b)(1) of the Act, 21  U.S.C. section 360bbb-3(b)(1), unless the authorization is terminated or revoked sooner. Performed at [facility], [place] [address] [place], [place] [address]   Respiratory Panel by PCR     Status: None   Collection Time: 08/29/19 10:56 PM   Specimen: Nasopharyngeal Swab; Respiratory  Result Value Ref Range Status   Adenovirus NOT DETECTED NOT DETECTED Final   Coronavirus 229E NOT DETECTED NOT DETECTED Final    Comment: (NOTE) The Coronavirus on the Respiratory Panel, DOES NOT test for the novel  Coronavirus (2019 nCoV)    Coronavirus HKU1 NOT DETECTED NOT DETECTED Final   Coronavirus NL63 NOT DETECTED NOT DETECTED Final   Coronavirus OC43 NOT DETECTED NOT DETECTED Final   Metapneumovirus NOT DETECTED NOT DETECTED Final   Rhinovirus / Enterovirus NOT DETECTED NOT DETECTED Final   Influenza A NOT DETECTED NOT DETECTED Final   Influenza B NOT DETECTED NOT DETECTED Final   Parainfluenza Virus 1 NOT DETECTED NOT DETECTED Final   Parainfluenza Virus 2 NOT DETECTED NOT DETECTED Final   Parainfluenza Virus 3 NOT DETECTED NOT DETECTED Final   Parainfluenza Virus 4 NOT DETECTED NOT DETECTED Final   Respiratory Syncytial Virus NOT DETECTED NOT DETECTED Final   Bordetella pertussis NOT DETECTED NOT DETECTED Final   Chlamydophila pneumoniae NOT DETECTED NOT DETECTED Final   Mycoplasma pneumoniae NOT DETECTED NOT DETECTED Final    Comment: Performed at [facility], [place] [place], [place] [address]     Terri Piedra, [place] [facility] [facility] [phone] Pager  08/30/2019  8:38 AM

## 2019-08-30 NOTE — Progress Notes (Signed)
PHARMACY NOTE:  ANTIMICROBIAL RENAL DOSAGE ADJUSTMENT  Current antimicrobial regimen includes a mismatch between antimicrobial dosage and estimated renal function.  As per policy approved by the Pharmacy & Therapeutics and Medical Executive Committees, the antimicrobial dosage will be adjusted accordingly.  Current antimicrobial dosage:  Ampicillin 2 g q6hr  Indication: Enterococcus bacteremia  Renal Function:  Estimated Creatinine Clearance: 25.8 mL/min (A) (by C-G formula based on SCr of 2.61 mg/dL (H)). []      On intermittent HD, scheduled: []      On CRRT    Antimicrobial dosage has been changed to:  Ampicillin 2 g q8hr  Additional comments:   Thank you for allowing pharmacy to be a part of this patient's care.  Agnes Lawrence, PharmD PGY1 Pharmacy Resident

## 2019-08-30 NOTE — Progress Notes (Signed)
PROGRESS NOTE    [name]  GEX:[id number] DOB: July 26, 1951 DOA: September 10, 2019 PCP: Ignatius Specking, MD    Brief Narrative:  68 year old gentleman with prior history of coronary artery disease status post CABG, aortic stenosis status post AVR on Coumadin, atrial fibrillation status post maze procedure, obstructive sleep apnea on CPAP presents with redness of breath and fevers.  He was admitted for acute respiratory failure with hypoxia probably secondary to pulmonary edema and community-acquired pneumonia.  Echocardiogram revealed 1.7 mobile vegetation.  Patient was started on ampicillin and ceftriaxone as per ID recommendations.  CT of the chest showed multifocal bilateral groundglass infiltrates.  PCCM, cardiology and ID on board and appreciate their recommendations. Patient seen and examined today  Assessment & Plan:   Principal Problem:   Bacteremia due to Enterococcus Active Problems:   H/O mechanical aortic valve replacement   Congestive heart failure (CHF) (HCC)   CHF (congestive heart failure) (HCC)   Puerperal sepsis with acute hypoxic respiratory failure and septic shock (HCC)   Hypoxia   Acute renal failure (HCC)   Acute bacterial endocarditis   Acute respiratory failure with hypoxia (HCC)   Acute respiratory failure with hypoxia secondary to pulmonary edema versus pneumonia versus?  Septic emboli CT of the chest showed diffuse patchy bilateral infiltrates with small effusions.  And he is requiring up to 5 L of nasal cannula oxygen to keep sats greater than 90%.  He is currently not wheezing hence steroids were discontinued by PCCM.  Continue with Lasix for diuresis and echocardiogram showed tricuspid valve endocarditis. Wean oxygen as appropriate. Continue with IV antibiotics.    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 Plan for TEE on Monday. Follow blood  cultures   History of coronary artery disease status post CABG and status post AVR on chronic anticoagulation holding anticoagulation for supratherapeutic INR. Continue with aspirin Patient denies any chest pain at this time.    Chronic atrial fibrillation   rate controlled. supratherapeutic iNR, holding coumadin.    Diabetes mellitus Uncontrolled with hyperglycemia Probably secondary to IV steroids which were stopped.  We will increase the Lantus to 10 units twice daily briefly and change sliding scale to resistant SSI.  And will monitor. Get A1c  Hyponatremia.  Fluid overload.   AKI suspect secondary to sepsis Get urinalysis, urine studies and ultrasound renal to rule out obstruction  Normocytic anemia  Transfuse to keep hemoglobin greater than 7.    Leukocytosis:  Possibly from sepsis and endocarditis.  Monitor.    DVT prophylaxis: Supratherapeutic INR, SCDs Code Status: Full code Family Communication: None at bedside  disposition Plan: Pending clinical improvement and further evaluation of endocarditis   Consultants:   Cardiology  Infectious disease  Critical care  Procedures: TEE scheduled for Monday Antimicrobials: Ampicillin  Subjective: Patient reports his breathing has improved compared to yesterday but he is not back to baseline, not very tachypneic on talking still requiring about 5 L of nasal cannula oxygen to keep sats greater than 90%.  Objective: Vitals:   08/30/19 0601 08/30/19 0745 08/30/19 0834 08/30/19 1201  BP:   (!) 118/40 (!) 107/43  Pulse:  61  63  Resp:  (!) 22  20  Temp:    97.7 F (36.5 C)  TempSrc:    Oral  SpO2:  95% 94% 98%  Weight: 77.6 kg     Height:        Intake/Output  Summary (Last 24 hours) at 08/30/2019 1750 Last data filed at 08/30/2019 1500 Gross per 24 hour  Intake 447.35 ml  Output 500 ml  Net -52.65 ml   Filed Weights   08/29/19 1232 08/30/19 0601  Weight: 77.6 kg 77.6 kg    Examination:  General  exam: does not appear to be in distress though on 5l it of [place] oxygen.  Respiratory system: bilateral rales , coarse breath sounds air entry fair.  Cardiovascular system: S1 & S2 heard, RRR. No JVD, Gastrointestinal system: Abdomen is nondistended, soft and nontender. No organomegaly or masses felt. Normal bowel sounds heard. Central nervous system: Alert and oriented. No focal neurological deficits. Extremities: trace pedal edema.  Skin: No rashes, lesions or ulcers Psychiatry:  Mood & affect appropriate.     Data Reviewed: I have personally reviewed following labs and imaging studies  CBC: Recent Labs  Lab 09/01/2019 1654 08/29/19 0300 08/29/19 0811 08/30/19 0405  WBC 16.3* 20.6* 20.1* 17.1*  NEUTROABS  --   --  16.8*  --   HGB 10.6* 10.7* 10.4* 9.8*  HCT 32.3* 31.3* 32.3* 29.5*  MCV 93.6 92.9 94.2 91.6  PLT 307 385 414* 415*   Basic Metabolic Panel: Recent Labs  Lab 08/07/2019 1654 08/22/2019 2240 08/29/19 0300 08/29/19 2218 08/30/19 0405 08/30/19 0706 08/30/19 1001  NA 133*  --  132*  --  131*  --  133*  K 4.0  --  4.7  --  5.0  --  4.7  CL 96*  --  97*  --  96*  --  97*  CO2 24  --  23  --  25  --  22  GLUCOSE 229*  --  209* 404* 383* 364* 343*  BUN 34*  --  36*  --  53*  --  59*  CREATININE 1.97*  --  1.82*  --  2.31*  --  2.61*  CALCIUM 8.6*  --  8.4*  --  8.7*  --  8.9  MG  --  2.5*  --   --   --   --   --    GFR: Estimated Creatinine Clearance: 25.8 mL/min (A) (by C-G formula based on SCr of 2.61 mg/dL (H)). Liver Function Tests: Recent Labs  Lab 08/29/19 1601 08/30/19 0405  AST 39 30  ALT 37 35  ALKPHOS 110 98  BILITOT 1.5* 1.0  PROT 6.8 6.5  ALBUMIN 2.8* 2.6*   No results for input(s): LIPASE, AMYLASE in the last 168 hours. No results for input(s): AMMONIA in the last 168 hours. Coagulation Profile: Recent Labs  Lab 08/14/2019 1901 08/29/19 0300 08/30/19 0405  INR 5.0* 5.0* 4.9*   Cardiac Enzymes: No results for input(s): CKTOTAL, CKMB,  CKMBINDEX, TROPONINI in the last 168 hours. BNP (last 3 results) No results for input(s): PROBNP in the last 8760 hours. HbA1C: Recent Labs    08/29/19 0300  HGBA1C 9.8*   CBG: Recent Labs  Lab 08/29/19 1248 08/29/19 1641 08/29/19 2138 08/30/19 0619 08/30/19 1158  GLUCAP 194* 259* 426* 409* 392*   Lipid Profile: Recent Labs    08/30/19 0405  CHOL 106  HDL 24*  LDLCALC 65  TRIG 85  CHOLHDL 4.4   Thyroid Function Tests: No results for input(s): TSH, T4TOTAL, FREET4, T3FREE, THYROIDAB in the last 72 hours. Anemia Panel: No results for input(s): VITAMINB12, FOLATE, FERRITIN, TIBC, IRON, RETICCTPCT in the last 72 hours. Sepsis Labs: Recent Labs  Lab 08/27/2019 2113 08/29/2019 2334 08/29/19 1601 08/30/19  0405  PROCALCITON  --   --  0.31 0.40  LATICACIDVEN 1.3 1.4  --   --     Recent Results (from the past 240 hour(s))  Blood culture (routine x 2)     Status: None (Preliminary result)   Collection Time: 08/21/2019  9:00 PM   Specimen: BLOOD  Result Value Ref Range Status   Specimen Description BLOOD RIGHT ANTECUBITAL  Final   Special Requests   Final    BOTTLES DRAWN AEROBIC AND ANAEROBIC Blood Culture results may not be optimal due to an inadequate volume of blood received in culture bottles   Culture   Final    NO GROWTH 2 DAYS Performed at [facility], [address], [place], [place] [address]    Report Status PENDING  Incomplete  Blood culture (routine x 2)     Status: Abnormal (Preliminary result)   Collection Time: 08/08/2019  9:03 PM   Specimen: BLOOD  Result Value Ref Range Status   Specimen Description BLOOD BLOOD LEFT FOREARM  Final   Special Requests   Final    BOTTLES DRAWN AEROBIC AND ANAEROBIC Blood Culture results may not be optimal due to an inadequate volume of blood received in culture bottles   Culture  Setup Time   Final    GRAM POSITIVE COCCI AEROBIC BOTTLE ONLY CRITICAL RESULT CALLED TO, READ BACK BY AND VERIFIED WITH: Kelby Fam [facility]  08/29/19 2025 JDW Performed at [facility], [address] [place], [place] [address]    Culture ENTEROCOCCUS SPECIES (A)  Final   Report Status PENDING  Incomplete  Blood Culture ID Panel (Reflexed)     Status: Abnormal   Collection Time: 08/31/2019  9:03 PM  Result Value Ref Range Status   Enterococcus species DETECTED (A) NOT DETECTED Final    Comment: CRITICAL RESULT CALLED TO, READ BACK BY AND VERIFIED WITH: F WILSON PHARMD 08/29/19 2025 JDW    Vancomycin resistance NOT DETECTED NOT DETECTED Final   Listeria monocytogenes NOT DETECTED NOT DETECTED Final   Staphylococcus species NOT DETECTED NOT DETECTED Final   Staphylococcus aureus (BCID) NOT DETECTED NOT DETECTED Final   Streptococcus species NOT DETECTED NOT DETECTED Final   Streptococcus agalactiae NOT DETECTED NOT DETECTED Final   Streptococcus pneumoniae NOT DETECTED NOT DETECTED Final   Streptococcus pyogenes NOT DETECTED NOT DETECTED Final   Acinetobacter baumannii NOT DETECTED NOT DETECTED Final   Enterobacteriaceae species NOT DETECTED NOT DETECTED Final   Enterobacter cloacae complex NOT DETECTED NOT DETECTED Final   Escherichia coli NOT DETECTED NOT DETECTED Final   Klebsiella oxytoca NOT DETECTED NOT DETECTED Final   Klebsiella pneumoniae NOT DETECTED NOT DETECTED Final   Proteus species NOT DETECTED NOT DETECTED Final   Serratia marcescens NOT DETECTED NOT DETECTED Final   Haemophilus influenzae NOT DETECTED NOT DETECTED Final   Neisseria meningitidis NOT DETECTED NOT DETECTED Final   Pseudomonas aeruginosa NOT DETECTED NOT DETECTED Final   Candida albicans NOT DETECTED NOT DETECTED Final   Candida glabrata NOT DETECTED NOT DETECTED Final   Candida krusei NOT DETECTED NOT DETECTED Final   Candida parapsilosis NOT DETECTED NOT DETECTED Final   Candida tropicalis NOT DETECTED NOT DETECTED Final    Comment: Performed at [facility], [address], [place], [place] [address]  Culture, blood (single) w  Reflex to ID Panel     Status: None (Preliminary result)   Collection Time: 08/08/2019  9:20 PM   Specimen: BLOOD RIGHT  HAND  Result Value Ref Range Status   Specimen Description BLOOD RIGHT HAND  Final   Special Requests   Final    BOTTLES DRAWN AEROBIC ONLY Blood Culture results may not be optimal due to an inadequate volume of blood received in culture bottles   Culture   Final    NO GROWTH 2 DAYS Performed at [place] [address] [place], [place] [address]    Report Status PENDING  Incomplete  SARS CORONAVIRUS 2 (TAT 6-24 HRS) Nasopharyngeal Nasopharyngeal Swab     Status: None   Collection Time: 08/30/2019  9:33 PM   Specimen: Nasopharyngeal Swab  Result Value Ref Range Status   SARS Coronavirus 2 NEGATIVE NEGATIVE Final    Comment: (NOTE) SARS-CoV-2 target nucleic acids are NOT DETECTED. The SARS-CoV-2 RNA is generally detectable in upper and lower respiratory specimens during the acute phase of infection. Negative results do not preclude SARS-CoV-2 infection, do not rule out co-infections with other pathogens, and should not be used as the sole basis for treatment or other patient management decisions. Negative results must be combined with clinical observations, patient history, and epidemiological information. The expected result is Negative. Fact Sheet for Patients: SugarRoll.be Fact Sheet for Healthcare Providers: https://www.woods-mathews.com/ This test is not yet approved or cleared by the [place] FDA and  has been authorized for detection and/or diagnosis of SARS-CoV-2 by FDA under an Emergency Use Authorization (EUA). This EUA will remain  in effect (meaning this test can be used) for the duration of the COVID-19 declaration under Section 56 4(b)(1) of the Act, 21 U.S.C. section 360bbb-3(b)(1), unless the authorization is terminated or revoked sooner. Performed at [facility], [place] [address]  [place], [place] [address]   Culture, blood (Routine X 2) w Reflex to ID Panel     Status: None (Preliminary result)   Collection Time: 08/29/19 10:37 PM   Specimen: BLOOD  Result Value Ref Range Status   Specimen Description BLOOD RIGHT ANTECUBITAL  Final   Special Requests   Final    BOTTLES DRAWN AEROBIC AND ANAEROBIC Blood Culture adequate volume   Culture   Final    NO GROWTH < 24 HOURS Performed at [facility], [place] [address] [place], [place] [address]    Report Status PENDING  Incomplete  Culture, blood (Routine X 2) w Reflex to ID Panel     Status: None (Preliminary result)   Collection Time: 08/29/19 10:41 PM   Specimen: BLOOD LEFT HAND  Result Value Ref Range Status   Specimen Description BLOOD LEFT HAND  Final   Special Requests   Final    BOTTLES DRAWN AEROBIC AND ANAEROBIC Blood Culture adequate volume   Culture   Final    NO GROWTH < 24 HOURS Performed at [facility], [place] [address] [place], [place] [address]    Report Status PENDING  Incomplete  Respiratory Panel by PCR     Status: None   Collection Time: 08/29/19 10:56 PM   Specimen: Nasopharyngeal Swab; Respiratory  Result Value Ref Range Status   Adenovirus NOT DETECTED NOT DETECTED Final   Coronavirus 229E NOT DETECTED NOT DETECTED Final    Comment: (NOTE) The Coronavirus on the Respiratory Panel, DOES NOT test for the novel  Coronavirus (2019 nCoV)    Coronavirus HKU1 NOT DETECTED NOT DETECTED Final   Coronavirus NL63 NOT DETECTED NOT DETECTED Final   Coronavirus OC43 NOT DETECTED NOT DETECTED Final   Metapneumovirus  NOT DETECTED NOT DETECTED Final   Rhinovirus / Enterovirus NOT DETECTED NOT DETECTED Final   Influenza A NOT DETECTED NOT DETECTED Final   Influenza B NOT DETECTED NOT DETECTED Final   Parainfluenza Virus 1 NOT DETECTED NOT DETECTED Final   Parainfluenza Virus 2 NOT DETECTED NOT DETECTED Final   Parainfluenza Virus 3 NOT DETECTED NOT DETECTED Final   Parainfluenza Virus 4 NOT  DETECTED NOT DETECTED Final   Respiratory Syncytial Virus NOT DETECTED NOT DETECTED Final   Bordetella pertussis NOT DETECTED NOT DETECTED Final   Chlamydophila pneumoniae NOT DETECTED NOT DETECTED Final   Mycoplasma pneumoniae NOT DETECTED NOT DETECTED Final    Comment: Performed at [facility], [address] [place], [place] [address]         Radiology Studies: Ct Chest Wo Contrast  Result Date: 08/30/2019 CLINICAL DATA:  Dyspnea and shortness of breath. EXAM: CT CHEST WITHOUT CONTRAST TECHNIQUE: Multidetector CT imaging of the chest was performed following the standard protocol without IV contrast. COMPARISON:  Radiograph earlier this day. Chest CT [facility] FINDINGS: Cardiovascular: Prosthetic aortic valve. There is aortic atherosclerosis. Post CABG with calcification of native coronary arteries. Borderline cardiomegaly. No pericardial effusion. Mediastinum/Nodes: Multiple prominent mediastinal lymph nodes are new from prior exam likely reactive. For example lower paratracheal node measures 12 mm short axis. Decompressed esophagus. No visualized thyroid nodule. Lungs/Pleura: Patchy ground-glass opacities throughout both lungs, new from prior exam. Mild smooth septal thickening at the apices. There are small bilateral pleural effusions and fluid in the fissures. Pleural calcifications in the left dependent lower lobe. Trachea and mainstem bronchi are patent. Upper Abdomen: Vascular calcifications. No acute findings. Cyst in the upper right kidney again seen. Musculoskeletal: Median sternotomy. There are no acute or suspicious osseous abnormalities. IMPRESSION: 1. Multifocal bilateral ground-glass opacities throughout both lungs. In the setting of pleural effusions and septal thickening, findings favor pulmonary edema or atypical infection. 2. Small bilateral pleural effusions and fluid in the fissures. Aortic Atherosclerosis (ICD10-I70.0). Electronically Signed   By:  Narda RutherfordMelanie  Sanford M.D.   On: 08/30/2019 03:01        Scheduled Meds:  amLODipine  5 mg Oral Daily   aspirin  81 mg Oral Daily   atorvastatin  20 mg Oral Daily   feeding supplement (GLUCERNA SHAKE)  237 mL Oral TID BM   insulin aspart  0-20 Units Subcutaneous TID WC   insulin aspart  0-5 Units Subcutaneous QHS   insulin glargine  10 Units Subcutaneous Daily   iron polysaccharides  150 mg Oral Daily   levothyroxine  75 mcg Oral Q0600   multivitamin with minerals  1 tablet Oral Daily   tamsulosin  0.4 mg Oral Daily   Continuous Infusions:  sodium chloride 20 mL/hr at 08/30/19 0436   ampicillin (OMNIPEN) IV 2 g (08/30/19 1440)   cefTRIAXone (ROCEPHIN)  IV 2 g (08/30/19 1045)     LOS: 2 days        Kathlen ModyVijaya Ellyanna Holton, MD [facility] Pager [phone]  If 7PM-7AM, please contact night-coverage [email or web address] Password [email or web address] 08/30/2019, 5:50 PM

## 2019-08-30 NOTE — Plan of Care (Signed)
  Problem: Education: Goal: Knowledge of General Education information will improve Description: Including pain rating scale, medication(s)/side effects and non-pharmacologic comfort measures Outcome: Progressing   Problem: Clinical Measurements: Goal: Diagnostic test results will improve Outcome: Progressing Goal: Respiratory complications will improve Outcome: Progressing Goal: Cardiovascular complication will be avoided Outcome: Progressing   Problem: Activity: Goal: Risk for activity intolerance will decrease Outcome: Progressing   

## 2019-08-30 NOTE — Progress Notes (Signed)
Initial Nutrition Assessment  DOCUMENTATION CODES:   Not applicable  INTERVENTION:   -D/c Ensure Enlive po BID, each supplement provides 350 kcal and 20 grams of protein -Glucerna Shake po TID, each supplement provides 220 kcal and 10 grams of protein -MVI with minerals daily  NUTRITION DIAGNOSIS:   Increased nutrient needs related to acute illness as evidenced by estimated needs.  GOAL:   Patient will meet greater than or equal to 90% of their needs  MONITOR:   PO intake, Supplement acceptance, Diet advancement, Labs, Weight trends, Skin, I & O's  REASON FOR ASSESSMENT:   Malnutrition Screening Tool    ASSESSMENT:   [name] is a 68 y.o. male with medical history significant of CAD status post CABG, aortic stenosis status post AVR on Coumadin, atrial fibrillation status post maze procedure, OSA noncompliant with CPAP who presented with worsening shortness of breath.  Patient reports that he recently had several hospitalization at [facility] since April. States that he had acute kidney injury as well as pneumonia.  Pt admitted with COPD exacerbation, AKI, pneumonia, and possible endocarditis.   Reviewed I/O's: -293 ml x 24 hours   UOP: 500 ml x 24 hours   Case discussed with RN, who reports that pt ate some breakfast this morning and was just given an Ensure supplement. She confirms TEE is on hold.   Spoke with pt, who was sitting in recliner chair at time of visit. He reports eating very little breakfast this morning, due to having multiple visitors and it getting cold. He estimates he had a few bites of grits and eggs. Pt also finished about 75% of Ensure supplement.   PTA, pt reports decreased appetite over the past 3 weeks. He shares that he was still eating about the same amount as he usually does and was forcing himself to eat due to DM. Pt typically consumes 3 meals per day (Breakfast: apple or tomato sandwich; Lunch: vegetable plate; Dinner: hot dog).    Pt endorses that he has lost about 11 pounds within the past 3 months, as he has not fully recovered from a prior hospitalization. Per wt hx, he has experienced a 7.5% wt loss over the past 5 months, which is not significant for time frame.   Discussed importance of good meal and supplement intake to promote healing. Pt amenable to supplements.   Lab Results  Component Value Date   HGBA1C 9.8 (H) 08/29/2019   PTA DM medications are 10 mg glimepiride BID, 10 units insulin glargine daily, and 5 mg linagliptin daily.   Labs reviewed: Na: 131, CBGS: 409 (inpatient orders for glycemic control are 0-20 units insulin aspart TID with meals, 0-5 units insulin aspart q HS, and 10 units insulin glargine daily).   NUTRITION - FOCUSED PHYSICAL EXAM:    Most Recent Value  Orbital Region  No depletion  Upper Arm Region  No depletion  Thoracic and Lumbar Region  No depletion  Buccal Region  No depletion  Temple Region  No depletion  Clavicle Bone Region  No depletion  Clavicle and Acromion Bone Region  No depletion  Scapular Bone Region  No depletion  Dorsal Hand  No depletion  Patellar Region  No depletion  Anterior Thigh Region  No depletion  Posterior Calf Region  No depletion  Edema (RD Assessment)  Mild  Hair  Reviewed  Eyes  Reviewed  Mouth  Reviewed  Skin  Reviewed  Nails  Reviewed       Diet  Order:   Diet Order            Diet NPO time specified Except for: Sips with Meds  Diet effective midnight        Diet heart healthy/carb modified Room service appropriate? Yes; Fluid consistency: Thin  Diet effective now              EDUCATION NEEDS:   Education needs have been addressed  Skin:  Skin Assessment: Reviewed RN Assessment  Last BM:  08/29/19  Height:   Ht Readings from Last 1 Encounters:  08/29/19 5\' 8"  (1.727 m)    Weight:   Wt Readings from Last 1 Encounters:  08/30/19 77.6 kg    Ideal Body Weight:  70 kg  BMI:  Body mass index is 26.02  kg/m.  Estimated Nutritional Needs:   Kcal:  1950-2150  Protein:  100-115 grams  Fluid:  1.9-2.1 L    Dvid Pendry A. 09/01/19, RD, LDN, CDCES Registered Dietitian II Certified Diabetes Care and Education Specialist Pager: [phone] After hours Pager: [phone]

## 2019-08-30 NOTE — Progress Notes (Signed)
ANTICOAGULATION CONSULT NOTE - Follow Up Consult  Pharmacy Consult for Coumadin Indication:  afib, mechanical AVR  No Known Allergies  Patient Measurements: Height: 5\' 8"  (172.7 cm) Weight: 171 lb 1.6 oz (77.6 kg) IBW/kg (Calculated) : 68.4  Vital Signs: Temp: 97.7 F (36.5 C) (09/25 1201) Temp Source: Oral (09/25 1201) BP: 107/43 (09/25 1201) Pulse Rate: 63 (09/25 1201)  Labs: Recent Labs    08/26/2019 1654 September 02, 2019 1844 08/29/2019 1901 08/29/19 0300 08/29/19 0811 08/30/19 0405 08/30/19 1001  HGB 10.6*  --   --  10.7* 10.4* 9.8*  --   HCT 32.3*  --   --  31.3* 32.3* 29.5*  --   PLT 307  --   --  385 414* 415*  --   LABPROT  --   --  45.5* 45.8*  --  44.9*  --   INR  --   --  5.0* 5.0*  --  4.9*  --   CREATININE 1.97*  --   --  1.82*  --  2.31* 2.61*  TROPONINIHS 103* 91*  --   --   --   --   --     Estimated Creatinine Clearance: 25.8 mL/min (A) (by C-G formula based on SCr of 2.61 mg/dL (H)).  Assessment:  Anticoag: warfarin pta for afib, mechanical AVR. INR 4.9. Ddimer 4.27. Hgb 10.4>9.8. Plts WNL. PTA wafarin 5mg  Tues/Fri, 2.5mg  other days: INR 5 on admit.  Goal of Therapy:  INR 2.5-3.5 Monitor platelets by anticoagulation protocol: Yes   Plan:  -Hold warfarin for now. Daily INR -TEE rescheduled for Monday - Resume coumadin once INR <3.5  -Daily PT/INR   Taetum Flewellen S. Alford Highland, PharmD, BCPS Clinical Staff Pharmacist Eilene Ghazi Stillinger 08/30/2019,1:08 PM

## 2019-08-30 NOTE — Progress Notes (Signed)
NAME:  [name], MRN:  [id number], DOB:  [birth year], LOS: 2 ADMISSION DATE:  31-Aug-2019, CONSULTATION DATE:  08/29/19  REFERRING MD:  [name], CHIEF COMPLAINT:  hypoxia  Brief History   68 yo admitted with hypoxic resp failure admitted w/ TV endocarditis   Past Medical History   Past Medical History:  Diagnosis Date  . [name]   . Atrial fibrillation ([name])    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 February 2013  . CHF (congestive heart failure) ([name])   . Coronary atherosclerosis of native coronary artery    2 vessel s/p CABG initial surgery in 2004, status post redo coronary bypass grafting x 3 2013  . Essential hypertension   . H/O hiatal hernia   . Hypothyroidism   . Mixed hyperlipidemia   . Myocardial infarction ([name]) 1998  . Osteoarthritis   . Pneumonia 2010  . Rotator cuff tear    Right shoulder  . Sleep apnea    wears CPAP nightly  . Type 2 diabetes mellitus ([name])     Significant Hospital Events     Consults:  Cardiology 9/23  Procedures:    Significant Diagnostic Tests:  9/24 echo: LVEF 50-55%, rv with some reduced ef.   Micro Data:  9/23 blood: GPC 9/23 sars2: neg  Antimicrobials:  Cefepime 9/24-> 9/24 Rocephin 9/25 Ampicillin 9/24   Interim history/subjective:  Feels about the same.  Does report orthopnea when trying to lie flat   Objective   Blood pressure (Abnormal) 118/40, pulse 61, temperature 98.3 F (36.8 C), temperature source Oral, resp. rate (Abnormal) 22, height 5\' 8"  (1.727 m), weight 77.6 kg, SpO2 94 %.        Intake/Output Summary (Last 24 hours) at 08/30/2019 0845 Last data filed at 08/30/2019 09/01/2019 Gross per 24 hour  Intake 207.35 ml  Output 500 ml  Net -292.65 ml   Filed Weights   08/29/19 1232 08/30/19 0601  Weight: 77.6 kg 77.6 kg    Examination: General this is a 68 year old white male he is currently sitting up in chair eating breakfast and in no acute distress HEENT normocephalic  atraumatic no jugular venous distention appreciated mucous membranes are moist Pulmonary: Diffuse posterior bilateral rhonchi no accessory use currently Cardiac regular rate Abdomen: Soft not tender no organomegaly Extremities lower extremity edema is present pulses are palpable Neuro: Awake oriented no focal deficits.   Resolved Hospital Problem list     Assessment & Plan:  Acute hypoxic resp failure in setting of bacteremia/presumtive endocarditis and new bilateral patchy pulmonary infiltrates (edema vs evolving ALI or PNA) -Ct chest personally reviewed: new diffuse patchy GG infiltrates. Multiple mediastinal LNs, small bilateral effusions.  -oxygen requirements: Plan Dc solumedrol Continue supplemental oxygen Agree with Lasix for today, although volume status is difficult to assess currently renal function may be limiting factor Repeat a.m. chest x-ray  History of sleep apnea Plan Add nocturnal CPAP with auto titration  Severe Sepsis in setting of GPC bacteremia/TV endocarditis (RCID + enterococcus). TEE + large mobile mass on TV  -has been hemodynamically stable w/out any hypotension -ID now following -TEE held due to INR and hypoxia  Plan Await cultures  Day 2 ampicillin per ID, and  # 1 rocephin (per ID) TEE at some point to better assess TV but agree w/ holding for now (especially w/ identified TV thrombus) Trend cbc Further recs per ID   AVR 7 years ago. (on chronic AC) Plan Cont asa Cont tele   Chronic AF  Plan Rate control Holding AC   Hyponatremia->corrects to 135 w/ glucose Plan Needs better glycemic control Am chemistry    Worsening AKI (looks like has CKD at baseline) I&O balance is negative Plan Dc allopurinol Agree with Lasix today, however not clear if pulmonary infiltrates or edema versus pneumonia from tricuspid valve endocarditis Repeat afternoon chemistry and again chemistry in a.m.  Coumadin related coagulopathy Plan Holding warfarin   DM2 with hyperglycemia -still w/poor glycemic control  Plan Cont ssi  Would consider adjustment of basal dosing and change to resistant scale Dc solumedrol  Normocytic anemia Plan Trend cbc Transfuse for hgb < 7   Goals of care:  Per Dr Marcheta Grammes discussion 9/24 "Did discuss at length with pt and his wife. Challenging as pt's wife discloses that she does not like to talk about it but does understand that it is a necessary conversation. Pt is clear he would want to be intubated for short period if able to recover. He would want to be FULL code but only one round and if he were to code again he would want to be let go peacefully. I encouraged them to have a more open conversation abotut this as the pt wife was appropriately emotional with the challenging topic. Nonetheless he remains full code".   [name] ACNP-BC [place] Pager # [phone] OR # [phone] if no answer

## 2019-08-30 NOTE — Progress Notes (Addendum)
Inpatient Diabetes Program Recommendations  AACE/ADA: New Consensus Statement on Inpatient Glycemic Control   Target Ranges:  Prepandial:   less than 140 mg/dL      Peak postprandial:   less than 180 mg/dL (1-2 hours)      Critically ill patients:  140 - 180 mg/dL  Results for [name], [name] (MRN [id number]) as of 08/30/2019 09:10  Ref. Range 08/29/2019 07:42 08/29/2019 12:48 08/29/2019 16:41 08/29/2019 21:38 08/30/2019 06:19  Glucose-Capillary Latest Ref Range: 70 - 99 mg/dL 182 (H) 194 (H) 259 (H) 426 (H) 409 (H)   Results for [name], [name] (MRN [id number]) as of 08/30/2019 10:16  Ref. Range 08/29/2019 03:00  Hemoglobin A1C Latest Ref Range: 4.8 - 5.6 % 9.8 (H)   Review of Glycemic Control  Diabetes history: DM2 Outpatient Diabetes medications: Toujeo 10 units QHS, Tradjenta 5 mg daily, Glipizide 10 mg BID Current orders for Inpatient glycemic control: Lantus 10 units QHS, Novolog 0-9 units TID with meals, Novolog 0-5 units QHS; Solumedrol 40 mg Q6H  Inpatient Diabetes Program Recommendations:   Insulin-Basal: If steroids are continued, please consider increasing Lantus to 10 units BID (starting now).  Insulin-Meal Coverage: If patient is eating and if steroids are continued, please consider ordering Novolog 4 units TID with meals for meal coverage if patient eats at least 50% of meals.  A1C: A1C 9.8% on 08/29/19 indicating an average glucose of 235 mg/dl over the past 2-3 months. Patient reports prior A1C was 7.1% and that he has been in the hospital several times over the past few months and given steroids during hospitalizations.   NOTE: Spoke with patient over the phone about diabetes and home regimen for diabetes control. Patient reports being followed by PCP for diabetes management and currently taking Toujeo 10 units QHS, Tradjenta 5 mg daily, Glipizide 10 mg BID as an outpatient for diabetes control. Patient reports taking DM medications as prescribed and reports that he was started  on Toujeo about 1 week ago.  Patient states that his glucose has improved since starting the Toujeo and glucose has ranged from 130's - 230's mg/dl over the past week at home. Patient reports that his last A1C was 7.1%.  Discussed A1C results (9.8% on 08/29/19 ) and explained that current A1C indicates an average glucose of 235 mg/dl over the past 2-3 months. Explained that recent steroid use while in the hospital several times over the past few months have likely contributed to higher A1C results. Reviewed glucose and A1C goals.  Discussed current insulin order and explained that he has been given Solumedrol which is contributing to hyperglycemia (noted to be up to 426 mg/dl last night and 409 mg/dl this morning.  Encouraged patient to continue checking glucose at home and to follow up with PCP regarding DM control. Explained that he may still need adjustments with DM medications if his glucose continues to be elevated at home.  Patient verbalized understanding of information discussed and reports no further questions at this time related to diabetes.  Thanks, Barnie Alderman, RN, MSN, CDE Diabetes Coordinator Inpatient Diabetes Program [phone] (Team Pager from 8am to 5pm)

## 2019-08-30 NOTE — Progress Notes (Signed)
CRITICAL VALUE ALERT  Critical Value:  INR 4.9  Date & Time Notied:  08/30/19 555   Provider Notified:  Cardiology  Orders Received/Actions taken: Awaiting orders

## 2019-08-30 NOTE — Progress Notes (Signed)
TEE for endocarditis canceled for today given his INR and oxygen needs. He is rescheduled on Monday 2019-09-28 at [facility] with anesthesia. NPO at [facility] Sunday night.   Tami Lin Duke, PA-C 08/30/2019, 8:26 AM

## 2019-08-30 NOTE — Progress Notes (Signed)
No response from cardiology. Paged [facility]. No response. Will pass to day shift.

## 2019-08-30 NOTE — Progress Notes (Addendum)
Progress Note  Patient Name: [name] Date of Encounter: 08/30/2019  Primary Cardiologist: Nona DellSamuel McDowell, MD   Subjective   Patient reports orthopnea though overall he feels his breathing is stable. He reports very little UOP yesterday. No complaints of chest pain, palpitations, or LE edema. We discussed delaying his TEE until his oxygen demand is improved and INR is lower.  Inpatient Medications    Scheduled Meds:  allopurinol  300 mg Oral Daily   amLODipine  5 mg Oral Daily   aspirin  81 mg Oral Daily   atorvastatin  20 mg Oral Daily   feeding supplement (ENSURE ENLIVE)  237 mL Oral BID BM   furosemide  20 mg Intravenous Q12H   insulin aspart  0-5 Units Subcutaneous QHS   insulin aspart  0-9 Units Subcutaneous TID WC   insulin glargine  10 Units Subcutaneous QHS   iron polysaccharides  150 mg Oral Daily   levothyroxine  75 mcg Oral Q0600   methylPREDNISolone (SOLU-MEDROL) injection  40 mg Intravenous Q6H   tamsulosin  0.4 mg Oral Daily   Continuous Infusions:  sodium chloride 20 mL/hr at 08/30/19 0436   ampicillin (OMNIPEN) IV 2 g (08/30/19 0635)   PRN Meds: guaiFENesin-dextromethorphan, ipratropium-albuterol, nitroGLYCERIN   Vital Signs    Vitals:   08/30/19 0201 08/30/19 0454 08/30/19 0601 08/30/19 0745  BP:  (!) 117/43    Pulse: 62 64  61  Resp:  20  (!) 22  Temp:  98.3 F (36.8 C)    TempSrc:  Oral    SpO2: 94% 93%  95%  Weight:   77.6 kg   Height:        Intake/Output Summary (Last 24 hours) at 08/30/2019 0759 Last data filed at 08/30/2019 0650 Gross per 24 hour  Intake 207.35 ml  Output 500 ml  Net -292.65 ml   Filed Weights   08/29/19 1232 08/30/19 0601  Weight: 77.6 kg 77.6 kg    Telemetry    Atrial fibrillation with CVR/SVR - Personally Reviewed  ECG    No new tracings  Physical Exam   GEN: Sitting upright in bedside chair in no acute distress.   Neck: No JVD, no carotid bruits Cardiac: IRIR, +murmur, no  rubs or gallops.  Respiratory: diminished breath sounds at lung bases with faint basilar crackles; no wheezing or rhonchi GI: NABS, Soft, nontender, non-distended  MS: 1+ LE edema; No deformity. Neuro:  Nonfocal, moving all extremities spontaneously Psych: Normal affect   Labs    Chemistry Recent Labs  Lab 08/27/2019 1654 08/29/19 0300 08/29/19 1601 08/29/19 2218 08/30/19 0405 08/30/19 0706  NA 133* 132*  --   --  131*  --   K 4.0 4.7  --   --  5.0  --   CL 96* 97*  --   --  96*  --   CO2 24 23  --   --  25  --   GLUCOSE 229* 209*  --  404* 383* 364*  BUN 34* 36*  --   --  53*  --   CREATININE 1.97* 1.82*  --   --  2.31*  --   CALCIUM 8.6* 8.4*  --   --  8.7*  --   PROT  --   --  6.8  --  6.5  --   ALBUMIN  --   --  2.8*  --  2.6*  --   AST  --   --  39  --  30  --   ALT  --   --  37  --  35  --   ALKPHOS  --   --  110  --  98  --   BILITOT  --   --  1.5*  --  1.0  --   GFRNONAA 34* 37*  --   --  28*  --   GFRAA 39* 43*  --   --  32*  --   ANIONGAP 13 12  --   --  10  --      Hematology Recent Labs  Lab 08/29/19 0300 08/29/19 0811 08/30/19 0405  WBC 20.6* 20.1* 17.1*  RBC 3.37* 3.43* 3.22*  HGB 10.7* 10.4* 9.8*  HCT 31.3* 32.3* 29.5*  MCV 92.9 94.2 91.6  MCH 31.8 30.3 30.4  MCHC 34.2 32.2 33.2  RDW 13.4 13.4 13.3  PLT 385 414* 415*    Cardiac EnzymesNo results for input(s): TROPONINI in the last 168 hours. No results for input(s): TROPIPOC in the last 168 hours.   BNP Recent Labs  Lab 30-Aug-2019 1846 08/29/19 0811  BNP 389.1* 546.0*     DDimer  Recent Labs  Lab 08/29/19 1601  DDIMER 4.27*     Radiology    Dg Chest 2 View  Result Date: 08-30-19 CLINICAL DATA:  Sob. Pt explained that he's been out of work since 9/3 because he started experiencing emesis constantly. Pt has been feeling progressively more sob, stating he gets out of breath even walking from room to room at home. EXAM: CHEST - 2 VIEW COMPARISON:  Chest radiograph 08/23/2019,  08/08/2019 FINDINGS: Stable cardiomediastinal contours status post median sternotomy and CABG. Central vascular could check congestion. There are bilateral increased interstitial markings similar to prior likely representing mild pulmonary edema. Small scattered linear opacities likely reflect atelectasis. No new focal infiltrate. No pneumothorax or pleural effusion. No acute finding in the visualized skeleton. IMPRESSION: Vascular congestion and diffuse bilateral interstitial markings similar to prior likely representing mild pulmonary edema. Scattered atelectasis. No new focal infiltrate. Electronically Signed   By: Emmaline Kluver M.D.   On: 08-30-19 17:43   Ct Chest Wo Contrast  Result Date: 08/30/2019 CLINICAL DATA:  Dyspnea and shortness of breath. EXAM: CT CHEST WITHOUT CONTRAST TECHNIQUE: Multidetector CT imaging of the chest was performed following the standard protocol without IV contrast. COMPARISON:  Radiograph earlier this day. Chest CT 3 weeks ago at [facility] FINDINGS: Cardiovascular: Prosthetic aortic valve. There is aortic atherosclerosis. Post CABG with calcification of native coronary arteries. Borderline cardiomegaly. No pericardial effusion. Mediastinum/Nodes: Multiple prominent mediastinal lymph nodes are new from prior exam likely reactive. For example lower paratracheal node measures 12 mm short axis. Decompressed esophagus. No visualized thyroid nodule. Lungs/Pleura: Patchy ground-glass opacities throughout both lungs, new from prior exam. Mild smooth septal thickening at the apices. There are small bilateral pleural effusions and fluid in the fissures. Pleural calcifications in the left dependent lower lobe. Trachea and mainstem bronchi are patent. Upper Abdomen: Vascular calcifications. No acute findings. Cyst in the upper right kidney again seen. Musculoskeletal: Median sternotomy. There are no acute or suspicious osseous abnormalities. IMPRESSION: 1. Multifocal bilateral  ground-glass opacities throughout both lungs. In the setting of pleural effusions and septal thickening, findings favor pulmonary edema or atypical infection. 2. Small bilateral pleural effusions and fluid in the fissures. Aortic Atherosclerosis (ICD10-I70.0). Electronically Signed   By: Narda Rutherford M.D.   On: 08/30/2019 03:01    Cardiac Studies   Echocardiogram 08/29/2019:  1. The tricuspid valve is normal in structure. Tricuspid valve regurgitation is trivial. There is a 1.7 cm mobile mass attached to the tricuspid valve and prolapsing back and forth across the valve, concern for endocarditis. Recommend TEE to assess  tricuspid valve.  2. Left ventricular ejection fraction, by visual estimation, is 50 to 55%. The left ventricle has mildly decreased function. Normal left ventricular size. There is no left ventricular hypertrophy. Basal inferior and inferolateral akinesis.  3. Left ventricular diastolic Doppler parameters are indeterminate pattern of LV diastolic filling.  4. Definity contrast agent was given IV to delineate the left ventricular endocardial borders.  5. Mild mitral annular calcification.  6. The mitral valve is normal in structure. Mild to moderate mitral valve regurgitation. No evidence of mitral stenosis.  7. Global right ventricle has mildly reduced systolic function.The right ventricular size is normal. No increase in right ventricular wall thickness.  8. Left atrial size was moderately dilated.  9. Mechanical aortic valve replacement. Mild peri-valvular regurgitation. Mean gradient elevated at 24 mmHg suggesting at least mild patient-prosthesis mismatch. 10. Right atrial size was normal. 11. The inferior vena cava is dilated in size with >50% respiratory variability, suggesting right atrial pressure of 8 mmHg. 12. TR signal is inadequate for assessing pulmonary artery systolic pressure.   Patient Profile     68 y.o. male with a hx of CAD s/p CABG, afib s/p maze  procedure, Chronic diastolic HF, aortic stenosis s/p mechanical AVR in 2013 on coumadin, DM2, hypothyroidism who is being followed by cardiology for the evaluation of afib, CHF, and possible endocarditis   Assessment & Plan    1. 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 He has a possible dental abscess and has been on augmentin for upcoming dental procedure. WBC elevated to 20.6. BCx with NGTD, though he has been on antibiotics. Echo showed 1.7cm mobile mass attached to the tricuspid valve concerning for endocarditis. Recommended to TEE once INR improves (elevated to 5 on admission). INR 4.9 today. No complaints of bleeding. ID consulted today.  - Plan for TEE on Monday given persistently elevated INR and ongoing O2 demands- hopeful he will improve over the weekend - Continue antibiotics per primary team  2. Acute hypoxic respiratory failure 2/2 possible PNA +/- ALI +/- acute on chronic diastolic CHF: CT Chest with new diffuse patchy ground glass infiltrates, multiple mediastinal LNs, and small bilateral effusions. Started on IV lasix receiving a total of 80mg  yesterday with unimpressive response of UOP net - . Weight is 171kg. Satting in the mid 90s on high flow O2 via [place]. Cr up to 2.31 from 1.8 yesterday. Pulm following. On supportive care with nebulizers and steroids. No significant improvement at this point.  - Consider transfer to ICU for [place] monitoring to determine volume status if he is not responsive to IV lasix 80mg  this AM and Cr continues to rise. - Supportive care per primary team/PCCM  3. Paroxysmal atrial fibrillation: s/p MAZE procedure on coumadin. He was in atrial fibrillation on arrival. Now with paroxysmal episodes. Not on BBlocker due to history of bradycardia. Coumadin on hold given supratherapeutic INR (4.9 today).  - Resume coumadin once INR <3.5   4. Elevated troponin in  patient with CAD s/p redo CABG in 2013: Trop trend is flat at 102>91, not consistent with ACS. Last ischemic evaluation was a low risk myoview in 2017.  - Continue aspirin and statin  5. Aortic stenosis  s/p mechanical AVR in 2013: mild perivalvular regurgitation with mean gradient elevated at 24 mmHg suggesting at least mild patient-prosthesis mismatch on echo this admission. INR supratherapeutic to 4.9 today and coumadin is on hold. - Resume coumadin once INR <3.5  6. HTN: BP stable  - Continue amlodipine  7. HLD: LDL 65 on lipids today - Continue statin  8. DM type 2: poorly controlled with A1C 9.8 this admission - Continue management per primary team   For questions or updates, please contact [place] Please consult [email or web address] for contact info under Cardiology/STEMI.      Signed, Abigail Butts, PA-C  08/30/2019, 7:59 AM   [phone]

## 2019-08-31 ENCOUNTER — Inpatient Hospital Stay (HOSPITAL_COMMUNITY): Payer: BC Managed Care – PPO

## 2019-08-31 LAB — COMPREHENSIVE METABOLIC PANEL
ALT: 44 U/L (ref 0–44)
AST: 45 U/L — ABNORMAL HIGH (ref 15–41)
Albumin: 2.7 g/dL — ABNORMAL LOW (ref 3.5–5.0)
Alkaline Phosphatase: 105 U/L (ref 38–126)
Anion gap: 14 (ref 5–15)
BUN: 88 mg/dL — ABNORMAL HIGH (ref 8–23)
CO2: 23 mmol/L (ref 22–32)
Calcium: 9 mg/dL (ref 8.9–10.3)
Chloride: 94 mmol/L — ABNORMAL LOW (ref 98–111)
Creatinine, Ser: 4.22 mg/dL — ABNORMAL HIGH (ref 0.61–1.24)
GFR calc Af Amer: 16 mL/min — ABNORMAL LOW (ref >60)
GFR calc non Af Amer: 13 mL/min — ABNORMAL LOW (ref >60)
Glucose, Bld: 307 mg/dL — ABNORMAL HIGH (ref 70–99)
Potassium: 6 mmol/L — ABNORMAL HIGH (ref 3.5–5.1)
Sodium: 131 mmol/L — ABNORMAL LOW (ref 135–145)
Total Bilirubin: 0.9 mg/dL (ref 0.3–1.2)
Total Protein: 6.5 g/dL (ref 6.5–8.1)

## 2019-08-31 LAB — CBC
HCT: 26.5 % — ABNORMAL LOW (ref 39.0–52.0)
Hemoglobin: 9.1 g/dL — ABNORMAL LOW (ref 13.0–17.0)
MCH: 31.6 pg (ref 26.0–34.0)
MCHC: 34.3 g/dL (ref 30.0–36.0)
MCV: 92 fL (ref 80.0–100.0)
Platelets: 471 10*3/uL — ABNORMAL HIGH (ref 150–400)
RBC: 2.88 MIL/uL — ABNORMAL LOW (ref 4.22–5.81)
RDW: 13.7 % (ref 11.5–15.5)
WBC: 38.9 10*3/uL — ABNORMAL HIGH (ref 4.0–10.5)
nRBC: 0 % (ref 0.0–0.2)

## 2019-08-31 LAB — CULTURE, BLOOD (ROUTINE X 2)

## 2019-08-31 LAB — PROTIME-INR
INR: 5.5 (ref 0.8–1.2)
Prothrombin Time: 49.1 seconds — ABNORMAL HIGH (ref 11.4–15.2)

## 2019-08-31 LAB — PROCALCITONIN: Procalcitonin: 0.55 ng/mL

## 2019-08-31 LAB — GLUCOSE, CAPILLARY
Glucose-Capillary: 326 mg/dL — ABNORMAL HIGH (ref 70–99)
Glucose-Capillary: 218 mg/dL — ABNORMAL HIGH (ref 70–99)
Glucose-Capillary: 240 mg/dL — ABNORMAL HIGH (ref 70–99)
Glucose-Capillary: 181 mg/dL — ABNORMAL HIGH (ref 70–99)

## 2019-08-31 LAB — BASIC METABOLIC PANEL
Anion gap: 18 — ABNORMAL HIGH (ref 5–15)
BUN: 97 mg/dL — ABNORMAL HIGH (ref 8–23)
CO2: 21 mmol/L — ABNORMAL LOW (ref 22–32)
Calcium: 9 mg/dL (ref 8.9–10.3)
Chloride: 95 mmol/L — ABNORMAL LOW (ref 98–111)
Creatinine, Ser: 4.63 mg/dL — ABNORMAL HIGH (ref 0.61–1.24)
GFR calc Af Amer: 14 mL/min — ABNORMAL LOW (ref >60)
GFR calc non Af Amer: 12 mL/min — ABNORMAL LOW (ref >60)
Glucose, Bld: 219 mg/dL — ABNORMAL HIGH (ref 70–99)
Potassium: 5.1 mmol/L (ref 3.5–5.1)
Sodium: 134 mmol/L — ABNORMAL LOW (ref 135–145)

## 2019-08-31 LAB — LACTATE DEHYDROGENASE: LDH: 423 U/L — ABNORMAL HIGH (ref 98–192)

## 2019-08-31 MED ORDER — SODIUM CHLORIDE 0.9 % FOR CRRT
INTRAVENOUS_CENTRAL | Status: DC | PRN
  Filled 2019-08-31: qty 1000

## 2019-08-31 MED ORDER — CHLORHEXIDINE GLUCONATE CLOTH 2 % EX PADS
6.0000 | MEDICATED_PAD | Freq: Every day | CUTANEOUS | Status: DC
  Administered 2019-08-31 – 2019-09-01 (×2): 6 via TOPICAL

## 2019-08-31 MED ORDER — ACETAMINOPHEN 500 MG PO TABS
500.0000 mg | ORAL_TABLET | Freq: Once | ORAL | Status: AC | PRN
  Administered 2019-08-31: 500 mg via ORAL
  Filled 2019-08-31: qty 1

## 2019-08-31 MED ORDER — PRISMASOL BGK 4/2.5 32-4-2.5 MEQ/L REPLACEMENT SOLN
Status: DC
  Administered 2019-08-31: 19:00:00 via INTRAVENOUS_CENTRAL
  Filled 2019-08-31 (×3): qty 5000

## 2019-08-31 MED ORDER — CALCIUM GLUCONATE-NACL 1-0.675 GM/50ML-% IV SOLN: 1.0000 g | Freq: Once | INTRAVENOUS | Status: DC

## 2019-08-31 MED ORDER — INSULIN ASPART 100 UNIT/ML ~~LOC~~ SOLN: 5.0000 [IU] | Freq: Three times a day (TID) | SUBCUTANEOUS | Status: DC

## 2019-08-31 MED ORDER — PRISMASOL BGK 4/2.5 32-4-2.5 MEQ/L IV SOLN
INTRAVENOUS | Status: DC
  Administered 2019-08-31: 22:00:00 1500 mL/h via INTRAVENOUS_CENTRAL
  Administered 2019-08-31 – 2019-09-01 (×6): via INTRAVENOUS_CENTRAL
  Administered 2019-09-01 (×2): 1500 mL/h via INTRAVENOUS_CENTRAL
  Administered 2019-09-02 (×3): via INTRAVENOUS_CENTRAL
  Filled 2019-08-31 (×18): qty 5000

## 2019-08-31 MED ORDER — CALCIUM GLUCONATE-NACL 1-0.675 GM/50ML-% IV SOLN
1.0000 g | Freq: Once | INTRAVENOUS | Status: AC
  Administered 2019-08-31: 1000 mg via INTRAVENOUS
  Filled 2019-08-31: qty 50

## 2019-08-31 MED ORDER — HEPARIN SODIUM (PORCINE) 1000 UNIT/ML DIALYSIS
1000.0000 [IU] | INTRAMUSCULAR | Status: DC | PRN
  Filled 2019-08-31: qty 6

## 2019-08-31 MED ORDER — INSULIN ASPART 100 UNIT/ML ~~LOC~~ SOLN
5.0000 [IU] | Freq: Three times a day (TID) | SUBCUTANEOUS | Status: DC
  Administered 2019-08-31: 5 [IU] via SUBCUTANEOUS

## 2019-08-31 MED ORDER — PRISMASOL BGK 4/2.5 32-4-2.5 MEQ/L REPLACEMENT SOLN
Status: DC
  Administered 2019-08-31 – 2019-09-02 (×3): via INTRAVENOUS_CENTRAL
  Filled 2019-08-31 (×5): qty 5000

## 2019-08-31 MED ORDER — "THROMBI-PAD 3""X3"" EX PADS"
1.0000 | MEDICATED_PAD | Freq: Once | CUTANEOUS | Status: AC
  Administered 2019-08-31: 1 via TOPICAL
  Filled 2019-08-31: qty 1

## 2019-08-31 MED ORDER — SODIUM CHLORIDE 0.9 % IV SOLN
INTRAVENOUS | Status: DC
  Administered 2019-08-31: 10 mL/h via INTRAVENOUS
  Administered 2019-09-01: 03:00:00 via INTRAVENOUS

## 2019-08-31 MED ORDER — HEPARIN SODIUM (PORCINE) 1000 UNIT/ML IJ SOLN
3000.0000 [IU] | Freq: Once | INTRAMUSCULAR | Status: AC
  Administered 2019-08-31: 3000 [IU] via INTRAVENOUS
  Filled 2019-08-31: qty 3

## 2019-08-31 MED ORDER — ALTEPLASE 2 MG IJ SOLR: 2.0000 mg | Freq: Once | INTRAMUSCULAR | Status: DC | PRN

## 2019-08-31 MED ORDER — SODIUM POLYSTYRENE SULFONATE 15 GM/60ML PO SUSP
30.0000 g | Freq: Once | ORAL | Status: AC
  Administered 2019-08-31: 30 g via ORAL
  Filled 2019-08-31: qty 120

## 2019-08-31 NOTE — Progress Notes (Signed)
eLink Physician-Brief Progress Note Patient Name: KUNAL LEVARIO DOB: May 21, 1951 MRN: [id number]   Date of Service  08/31/2019  HPI/Events of Note  Asking for pain meds . CHF exacerbation. TEE on Monday for suspected TV endocarditis.   eICU Interventions  Tylenol prn once ordered. If not better call back.      Intervention Category Intermediate Interventions: Pain - evaluation and management  Elmer Sow 08/31/2019, 8:42 PM

## 2019-08-31 NOTE — Progress Notes (Signed)
PROGRESS NOTE    [name]  ZOX:[id number] DOB: 05-May-1951 DOA: 08/08/2019 PCP: Ignatius Specking, MD    Brief Narrative:  68 year old gentleman with prior history of coronary artery disease status post CABG, aortic stenosis status post AVR on Coumadin, atrial fibrillation status post maze procedure, obstructive sleep apnea on CPAP presents with redness of breath and fevers.  He was admitted for acute respiratory failure with hypoxia probably secondary to pulmonary edema and community-acquired pneumonia.  Echocardiogram revealed 1.7 mobile vegetation.  Patient was started on ampicillin and ceftriaxone as per ID recommendations.  CT of the chest showed multifocal bilateral groundglass infiltrates.  PCCM, cardiology and ID on board and appreciate their recommendations. Pt seen and examined today. He is on 5 lit of [place] OXYGEN.   Assessment & Plan:   Principal Problem:   Bacteremia due to Enterococcus Active Problems:   H/O mechanical aortic valve replacement   Congestive heart failure (CHF) (HCC)   CHF (congestive heart failure) (HCC)   Puerperal sepsis with acute hypoxic respiratory failure and septic shock (HCC)   Hypoxia   Acute renal failure (HCC)   Acute bacterial endocarditis   Acute respiratory failure with hypoxia (HCC)   Acute respiratory failure with hypoxia secondary to pulmonary edema versus pneumonia versus?  Septic emboli CT of the chest showed diffuse patchy bilateral infiltrates with small effusions.  And he is requiring up to 5 L of nasal cannula oxygen to keep sats greater than 90%.  He is currently not wheezing hence steroids were discontinued by PCCM. He was being diuresed with IV lasix.  echocardiogram showed tricuspid valve endocarditis. Resume IV antibiotics as per ID recommendations.    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 Plan for TEE on Monday. Follow blood  cultures   History of coronary artery disease status post CABG and status post AVR on chronic anticoagulation holding anticoagulation for supratherapeutic INR. Continue with aspirin Patient denies any chest pain at this time.    Chronic atrial fibrillation  rate controlled. supratherapeutic iNR, holding coumadin.    Diabetes mellitus Uncontrolled with hyperglycemia Probably secondary to IV steroids which were stopped.  We will increase the Lantus to 10 units twice daily briefly and change sliding scale to resistant SSI.   Continue the same  Hyponatremia.   possibly from Fluid overload.   AKI suspect secondary to sepsis, hypovolemia.  Creatinine worsened to 4 with only one dose of Iv lasix.  Urine output is less than 200 ml in the last 24 hours.  Nephrology consulted. Dicussed with PCCM and transferred the patient to ICU for CRT .   Normocytic anemia  Transfuse to keep hemoglobin greater than 7.    Leukocytosis:  Possibly from sepsis and endocarditis.  Monitor.    DVT prophylaxis: Supratherapeutic INR, SCDs Code Status: Full code Family Communication: None at bedside  disposition Plan: transfer to ICU.   Consultants:   Cardiology  Infectious disease  Critical care  Procedures: TEE scheduled for Monday Antimicrobials: Ampicillin  Subjective: PT ON 5 LIT of [place] oxygen, tachypnea, minimal urine output overnight.   Objective: Vitals:   08/30/19 1201 08/30/19 2047 08/31/19 0509 08/31/19 0917  BP: (!) 107/43 (!) 106/37 (!) 105/39 (!) 110/39  Pulse: 63 (!) 58 66 64  Resp: 20 20 (!) 22 20  Temp: 97.7 F (36.5 C) 97.6 F (36.4 C) (!) 97.5 F (36.4 C)   TempSrc: Oral Oral Oral  SpO2: 98% 96% 94% 96%  Weight:   79.4 kg   Height:        Intake/Output Summary (Last 24 hours) at 08/31/2019 1042 Last data filed at 08/31/2019 1000 Gross per 24 hour  Intake 1640 ml  Output 225 ml  Net 1415 ml   Filed Weights   08/29/19 1232 08/30/19 0601 08/31/19 0509    Weight: 77.6 kg 77.6 kg 79.4 kg    Examination:  General exam: mild distress on 5 lit of [place] oxygen.   Respiratory system: coarse breath sounds , tachypnea, diminished at bases.  Cardiovascular system: s1s2, RRR, no JVD. Gastrointestinal system: Abdomen is soft,NT ND BS+. Central nervous system: Alert and oriented, non focal. Extremities: no cyanosis or clubbing.  Skin: no rashes. Psychiatry:  Mood appropriate.     Data Reviewed: I have personally reviewed following labs and imaging studies  CBC: Recent Labs  Lab 25-Sep-2019 1654 08/29/19 0300 08/29/19 0811 08/30/19 0405 08/31/19 0516  WBC 16.3* 20.6* 20.1* 17.1* 38.9*  NEUTROABS  --   --  16.8*  --   --   HGB 10.6* 10.7* 10.4* 9.8* 9.1*  HCT 32.3* 31.3* 32.3* 29.5* 26.5*  MCV 93.6 92.9 94.2 91.6 92.0  PLT 307 385 414* 415* 471*   Basic Metabolic Panel: Recent Labs  Lab 2019/09/25 2240 08/29/19 0300  08/30/19 0405 08/30/19 0706 08/30/19 1001 08/30/19 1742 08/31/19 0516  NA  --  132*  --  131*  --  133* 131* 131*  K  --  4.7  --  5.0  --  4.7 5.8* 6.0*  CL  --  97*  --  96*  --  97* 95* 94*  CO2  --  23  --  25  --  22 17* 23  GLUCOSE  --  209*   < > 383* 364* 343* 358* 307*  BUN  --  36*  --  53*  --  59* 74* 88*  CREATININE  --  1.82*  --  2.31*  --  2.61* 3.25* 4.22*  CALCIUM  --  8.4*  --  8.7*  --  8.9 8.8* 9.0  MG 2.5*  --   --   --   --   --   --   --    < > = values in this interval not displayed.   GFR: Estimated Creatinine Clearance: 16 mL/min (A) (by C-G formula based on SCr of 4.22 mg/dL (H)). Liver Function Tests: Recent Labs  Lab 08/29/19 1601 08/30/19 0405 08/31/19 0516  AST 39 30 45*  ALT 37 35 44  ALKPHOS 110 98 105  BILITOT 1.5* 1.0 0.9  PROT 6.8 6.5 6.5  ALBUMIN 2.8* 2.6* 2.7*   No results for input(s): LIPASE, AMYLASE in the last 168 hours. No results for input(s): AMMONIA in the last 168 hours. Coagulation Profile: Recent Labs  Lab 09-25-2019 1901 08/29/19 0300 08/30/19 0405  08/31/19 0516  INR 5.0* 5.0* 4.9* 5.5*   Cardiac Enzymes: No results for input(s): CKTOTAL, CKMB, CKMBINDEX, TROPONINI in the last 168 hours. BNP (last 3 results) No results for input(s): PROBNP in the last 8760 hours. HbA1C: Recent Labs    08/29/19 0300  HGBA1C 9.8*   CBG: Recent Labs  Lab 08/30/19 0619 08/30/19 1158 08/30/19 1712 08/30/19 2147 08/31/19 0631  GLUCAP 409* 392* 381* 192* 326*   Lipid Profile: Recent Labs    08/30/19 0405  CHOL 106  HDL 24*  LDLCALC 65  TRIG 85  CHOLHDL 4.4  Thyroid Function Tests: No results for input(s): TSH, T4TOTAL, FREET4, T3FREE, THYROIDAB in the last 72 hours. Anemia Panel: No results for input(s): VITAMINB12, FOLATE, FERRITIN, TIBC, IRON, RETICCTPCT in the last 72 hours. Sepsis Labs: Recent Labs  Lab 08/11/2019 2113 08/21/2019 2334 08/29/19 1601 08/30/19 0405 08/31/19 0516  PROCALCITON  --   --  0.31 0.40 0.55  LATICACIDVEN 1.3 1.4  --   --   --     Recent Results (from the past 240 hour(s))  Blood culture (routine x 2)     Status: None (Preliminary result)   Collection Time: 08/10/2019  9:00 PM   Specimen: BLOOD  Result Value Ref Range Status   Specimen Description BLOOD RIGHT ANTECUBITAL  Final   Special Requests   Final    BOTTLES DRAWN AEROBIC AND ANAEROBIC Blood Culture results may not be optimal due to an inadequate volume of blood received in culture bottles   Culture   Final    NO GROWTH 3 DAYS Performed at [facility], [place] [address], [place], [place] [address]    Report Status PENDING  Incomplete  Blood culture (routine x 2)     Status: Abnormal   Collection Time: 08/29/2019  9:03 PM   Specimen: BLOOD  Result Value Ref Range Status   Specimen Description BLOOD BLOOD LEFT FOREARM  Final   Special Requests   Final    BOTTLES DRAWN AEROBIC AND ANAEROBIC Blood Culture results may not be optimal due to an inadequate volume of blood received in culture bottles   Culture  Setup Time   Final    GRAM  POSITIVE COCCI AEROBIC BOTTLE ONLY CRITICAL RESULT CALLED TO, READ BACK BY AND VERIFIED WITH: Tillman Sers [facility] 08/29/19 2025 JDW Performed at [facility], [place] [address] [place], [place] [address]    Culture ENTEROCOCCUS FAECALIS (A)  Final   Report Status 08/31/2019 FINAL  Final   Organism ID, Bacteria ENTEROCOCCUS FAECALIS  Final      Susceptibility   Enterococcus faecalis - MIC*    AMPICILLIN <=2 SENSITIVE Sensitive     VANCOMYCIN 1 SENSITIVE Sensitive     GENTAMICIN SYNERGY SENSITIVE Sensitive     * ENTEROCOCCUS FAECALIS  Blood Culture ID Panel (Reflexed)     Status: Abnormal   Collection Time: 08/17/2019  9:03 PM  Result Value Ref Range Status   Enterococcus species DETECTED (A) NOT DETECTED Final    Comment: CRITICAL RESULT CALLED TO, READ BACK BY AND VERIFIED WITH: F WILSON PHARMD 08/29/19 2025 JDW    Vancomycin resistance NOT DETECTED NOT DETECTED Final   Listeria monocytogenes NOT DETECTED NOT DETECTED Final   Staphylococcus species NOT DETECTED NOT DETECTED Final   Staphylococcus aureus (BCID) NOT DETECTED NOT DETECTED Final   Streptococcus species NOT DETECTED NOT DETECTED Final   Streptococcus agalactiae NOT DETECTED NOT DETECTED Final   Streptococcus pneumoniae NOT DETECTED NOT DETECTED Final   Streptococcus pyogenes NOT DETECTED NOT DETECTED Final   Acinetobacter baumannii NOT DETECTED NOT DETECTED Final   Enterobacteriaceae species NOT DETECTED NOT DETECTED Final   Enterobacter cloacae complex NOT DETECTED NOT DETECTED Final   Escherichia coli NOT DETECTED NOT DETECTED Final   Klebsiella oxytoca NOT DETECTED NOT DETECTED Final   Klebsiella pneumoniae NOT DETECTED NOT DETECTED Final   Proteus species NOT DETECTED NOT DETECTED Final   Serratia marcescens NOT DETECTED NOT DETECTED Final   Haemophilus influenzae NOT DETECTED NOT DETECTED Final   Neisseria meningitidis NOT DETECTED NOT DETECTED  Final   Pseudomonas aeruginosa NOT DETECTED NOT DETECTED Final    Candida albicans NOT DETECTED NOT DETECTED Final   Candida glabrata NOT DETECTED NOT DETECTED Final   Candida krusei NOT DETECTED NOT DETECTED Final   Candida parapsilosis NOT DETECTED NOT DETECTED Final   Candida tropicalis NOT DETECTED NOT DETECTED Final    Comment: Performed at [facility], [address] [place], [place] [address]  Culture, blood (single) w Reflex to ID Panel     Status: None (Preliminary result)   Collection Time: 09-17-2019  9:20 PM   Specimen: BLOOD RIGHT HAND  Result Value Ref Range Status   Specimen Description BLOOD RIGHT HAND  Final   Special Requests   Final    BOTTLES DRAWN AEROBIC ONLY Blood Culture results may not be optimal due to an inadequate volume of blood received in culture bottles   Culture   Final    NO GROWTH 3 DAYS Performed at [facility], [address] [place], [place] [address]    Report Status PENDING  Incomplete  SARS CORONAVIRUS 2 (TAT 6-24 HRS) Nasopharyngeal Nasopharyngeal Swab     Status: None   Collection Time: 09-17-2019  9:33 PM   Specimen: Nasopharyngeal Swab  Result Value Ref Range Status   SARS Coronavirus 2 NEGATIVE NEGATIVE Final    Comment: (NOTE) SARS-CoV-2 target nucleic acids are NOT DETECTED. The SARS-CoV-2 RNA is generally detectable in upper and lower respiratory specimens during the acute phase of infection. Negative results do not preclude SARS-CoV-2 infection, do not rule out co-infections with other pathogens, and should not be used as the sole basis for treatment or other patient management decisions. Negative results must be combined with clinical observations, patient history, and epidemiological information. The expected result is Negative. Fact Sheet for Patients: HairSlick.nohttps://www.fda.gov/media/138098/download Fact Sheet for Healthcare Providers: quierodirigir.comhttps://www.fda.gov/media/138095/download This test is not yet approved or cleared by the [place] FDA and  has been authorized for detection  and/or diagnosis of SARS-CoV-2 by FDA under an Emergency Use Authorization (EUA). This EUA will remain  in effect (meaning this test can be used) for the duration of the COVID-19 declaration under Section 56 4(b)(1) of the Act, 21 U.S.C. section 360bbb-3(b)(1), unless the authorization is terminated or revoked sooner. Performed at [facility], [address] [place], [place] [address]   Culture, blood (Routine X 2) w Reflex to ID Panel     Status: None (Preliminary result)   Collection Time: 08/29/19 10:37 PM   Specimen: BLOOD  Result Value Ref Range Status   Specimen Description BLOOD RIGHT ANTECUBITAL  Final   Special Requests   Final    BOTTLES DRAWN AEROBIC AND ANAEROBIC Blood Culture adequate volume   Culture   Final    NO GROWTH 2 DAYS Performed at [facility], [address] [place], [place] [address]    Report Status PENDING  Incomplete  Culture, blood (Routine X 2) w Reflex to ID Panel     Status: None (Preliminary result)   Collection Time: 08/29/19 10:41 PM   Specimen: BLOOD LEFT HAND  Result Value Ref Range Status   Specimen Description BLOOD LEFT HAND  Final   Special Requests   Final    BOTTLES DRAWN AEROBIC AND ANAEROBIC Blood Culture adequate volume   Culture   Final    NO GROWTH 2 DAYS Performed at [facility], [address] [place], [place] [address]    Report Status PENDING  Incomplete  Respiratory Panel by PCR     Status: None   Collection Time: 08/29/19 10:56 PM   Specimen: Nasopharyngeal Swab; Respiratory  Result Value Ref Range Status   Adenovirus NOT DETECTED NOT DETECTED Final   Coronavirus 229E NOT DETECTED NOT DETECTED Final    Comment: (NOTE) The Coronavirus on the Respiratory Panel, DOES NOT test for the novel  Coronavirus (2019 nCoV)    Coronavirus HKU1 NOT DETECTED NOT DETECTED Final   Coronavirus NL63 NOT DETECTED NOT DETECTED Final   Coronavirus OC43 NOT DETECTED NOT DETECTED Final   Metapneumovirus NOT DETECTED  NOT DETECTED Final   Rhinovirus / Enterovirus NOT DETECTED NOT DETECTED Final   Influenza A NOT DETECTED NOT DETECTED Final   Influenza B NOT DETECTED NOT DETECTED Final   Parainfluenza Virus 1 NOT DETECTED NOT DETECTED Final   Parainfluenza Virus 2 NOT DETECTED NOT DETECTED Final   Parainfluenza Virus 3 NOT DETECTED NOT DETECTED Final   Parainfluenza Virus 4 NOT DETECTED NOT DETECTED Final   Respiratory Syncytial Virus NOT DETECTED NOT DETECTED Final   Bordetella pertussis NOT DETECTED NOT DETECTED Final   Chlamydophila pneumoniae NOT DETECTED NOT DETECTED Final   Mycoplasma pneumoniae NOT DETECTED NOT DETECTED Final    Comment: Performed at [facility], [address] [place], [place] [address]         Radiology Studies: Ct Chest Wo Contrast  Result Date: 08/30/2019 CLINICAL DATA:  Dyspnea and shortness of breath. EXAM: CT CHEST WITHOUT CONTRAST TECHNIQUE: Multidetector CT imaging of the chest was performed following the standard protocol without IV contrast. COMPARISON:  Radiograph earlier this day. Chest CT 3 weeks ago at [facility] FINDINGS: Cardiovascular: Prosthetic aortic valve. There is aortic atherosclerosis. Post CABG with calcification of native coronary arteries. Borderline cardiomegaly. No pericardial effusion. Mediastinum/Nodes: Multiple prominent mediastinal lymph nodes are new from prior exam likely reactive. For example lower paratracheal node measures 12 mm short axis. Decompressed esophagus. No visualized thyroid nodule. Lungs/Pleura: Patchy ground-glass opacities throughout both lungs, new from prior exam. Mild smooth septal thickening at the apices. There are small bilateral pleural effusions and fluid in the fissures. Pleural calcifications in the left dependent lower lobe. Trachea and mainstem bronchi are patent. Upper Abdomen: Vascular calcifications. No acute findings. Cyst in the upper right kidney again seen. Musculoskeletal: Median sternotomy. There are  no acute or suspicious osseous abnormalities. IMPRESSION: 1. Multifocal bilateral ground-glass opacities throughout both lungs. In the setting of pleural effusions and septal thickening, findings favor pulmonary edema or atypical infection. 2. Small bilateral pleural effusions and fluid in the fissures. Aortic Atherosclerosis (ICD10-I70.0). Electronically Signed   By: Narda Rutherford M.D.   On: 08/30/2019 03:01   US Renal  Result Date: 08/30/2019 CLINICAL DATA:  Acute kidney injury. EXAM: RENAL / URINARY TRACT ULTRASOUND COMPLETE COMPARISON:  Ultrasound 06/05/2019.  Abdominal CT 08/07/2019 FINDINGS: Right Kidney: Renal measurements: 10.2 x 5.8 x 6.5 cm = volume: 201 mL. Simple cyst in the upper kidney measuring 3.8 cm, unchanged. Echogenicity within normal limits. No solid mass or hydronephrosis visualized. Left Kidney: Renal measurements: 9.6 x 4.9 x 5.5 cm = volume: 136 mL. Echogenicity within normal limits. No mass or hydronephrosis visualized. Bladder: Appears normal for degree of bladder distention. IMPRESSION: Unremarkable renal ultrasound.  Unchanged right renal cyst. Electronically Signed   By: Narda Rutherford M.D.   On: 08/30/2019 21:58   Dg Chest Port 1 View  Result Date: 08/31/2019 CLINICAL DATA:  Shortness of breath, tachypnea. EXAM: PORTABLE  CHEST 1 VIEW COMPARISON:  Radiograph of August 28, 2019. FINDINGS: Stable cardiomediastinal silhouette. Status post coronary bypass graft. No pneumothorax is noted. Mildly increased bilateral diffuse lung opacities are noted concerning for worsening edema or multifocal pneumonia. Small bilateral pleural effusions are noted. Bony thorax is unremarkable. IMPRESSION: Increased bilateral lung opacities are noted concerning for worsening edema or multifocal pneumonia. Small pleural effusions are noted. Electronically Signed   By: Lupita Raider M.D.   On: 08/31/2019 09:13        Scheduled Meds:  amLODipine  5 mg Oral Daily   aspirin  81 mg Oral  Daily   atorvastatin  20 mg Oral Daily   Chlorhexidine Gluconate Cloth  6 each Topical Daily   feeding supplement (GLUCERNA SHAKE)  237 mL Oral TID BM   insulin aspart  0-20 Units Subcutaneous TID WC   insulin aspart  0-5 Units Subcutaneous QHS   insulin aspart  5 Units Subcutaneous TID WC   insulin glargine  10 Units Subcutaneous BID   iron polysaccharides  150 mg Oral Daily   levothyroxine  75 mcg Oral Q0600   multivitamin with minerals  1 tablet Oral Daily   tamsulosin  0.4 mg Oral Daily   Continuous Infusions:  ampicillin (OMNIPEN) IV Stopped (08/31/19 7353)   cefTRIAXone (ROCEPHIN)  IV 2 g (08/30/19 2205)     LOS: 3 days        Kathlen Mody, MD [facility] Pager [phone]  If 7PM-7AM, please contact night-coverage [email or web address] Password [facility] 08/31/2019, 10:42 AM

## 2019-08-31 NOTE — Progress Notes (Signed)
ANTICOAGULATION CONSULT NOTE - Follow Up Consult  Pharmacy Consult for Coumadin Indication:  afib, mechanical AVR  No Known Allergies  Patient Measurements: Height: 5\' 8"  (172.7 cm) Weight: 175 lb 1.6 oz (79.4 kg) IBW/kg (Calculated) : 68.4  Vital Signs: Temp: 97.5 F (36.4 C) (09/26 0509) Temp Source: Oral (09/26 0509) BP: 105/39 (09/26 0509) Pulse Rate: 66 (09/26 0509)  Labs: Recent Labs    09/14/19 1654 09/14/19 1844  08/29/19 0300 08/29/19 0811 08/30/19 0405 08/30/19 1001 08/30/19 1742 08/31/19 0516  HGB 10.6*  --   --  10.7* 10.4* 9.8*  --   --  9.1*  HCT 32.3*  --   --  31.3* 32.3* 29.5*  --   --  26.5*  PLT 307  --   --  385 414* 415*  --   --  471*  LABPROT  --   --    < > 45.8*  --  44.9*  --   --  49.1*  INR  --   --    < > 5.0*  --  4.9*  --   --  5.5*  CREATININE 1.97*  --   --  1.82*  --  2.31* 2.61* 3.25* 4.22*  TROPONINIHS 103* 91*  --   --   --   --   --   --   --    < > = values in this interval not displayed.    Estimated Creatinine Clearance: 16 mL/min (A) (by C-G formula based on SCr of 4.22 mg/dL (H)).  Assessment: warfarin pta for afib (PTA wafarin 5mg  Tues/Fri, 2.5mg  other days), INR 5 on admit. mechanical AVR.   INR today is 5.5. H&H trending down slightly today at 9.1/26.5. Plts WNL.Per nursing no signs of bleeding.    Goal of Therapy:  INR 2.5-3.5 Monitor platelets by anticoagulation protocol: Yes   Plan:  -Hold warfarin today.  -TEE rescheduled for Monday - Resume coumadin once INR <3.5  -Daily INR, monitor for s/sx of bleeding.    Thank you,   Eddie Candle, PharmD PGY-1 Pharmacy Resident   Please check amion for clinical pharmacist contact number  08/31/2019,7:26 AM

## 2019-08-31 NOTE — Progress Notes (Signed)
NAME:  [name], MRN:  [id number], DOB:  Aug 17, 1951, LOS: 3 ADMISSION DATE:  08/30/2019, CONSULTATION DATE:  08/29/19  REFERRING MD:  Roger Shelter, CHIEF COMPLAINT:  hypoxia  Brief History   68 yo admitted with hypoxic resp failure admitted w/ TV endocarditis   Past Medical History   Past Medical History:  Diagnosis Date  . Aortic stenosis   . Atrial fibrillation ([place])    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 February 2013  . CHF (congestive heart failure) ([place])   . Coronary atherosclerosis of native coronary artery    2 vessel s/p CABG initial surgery in 2004, status post redo coronary bypass grafting x 3 2013  . Essential hypertension   . H/O hiatal hernia   . Hypothyroidism   . Mixed hyperlipidemia   . Myocardial infarction ([place]) 1998  . Osteoarthritis   . Pneumonia 2010  . Rotator cuff tear    Right shoulder  . Sleep apnea    wears CPAP nightly  . Type 2 diabetes mellitus ([place])     Significant Hospital Events   9/26 Worse, now more hypoxic and requiring more oxygen. Moved to ICU for worse renal fxn and biventricular HF   Consults:  Cardiology 9/23  Procedures:    Significant Diagnostic Tests:  9/24 echo: LVEF 50-55%, rv with some reduced ef.   Micro Data:  9/23 blood: enterococcus  9/23 sars2: neg  Antimicrobials:  Cefepime 9/24-> 9/24 Rocephin 9/25 Ampicillin 9/24   Interim history/subjective:  Breathing is worse.  More labored.  Requiring more oxygen   Objective   Blood pressure (Abnormal) 110/39, pulse 64, temperature (Abnormal) 97.5 F (36.4 C), temperature source Oral, resp. rate 20, height 5\' 8"  (1.727 m), weight 79.4 kg, SpO2 96 %.        Intake/Output Summary (Last 24 hours) at 08/31/2019 1023 Last data filed at 08/31/2019 1000 Gross per 24 hour  Intake 1640 ml  Output 225 ml  Net 1415 ml   Filed Weights   08/29/19 1232 08/30/19 0601 08/31/19 0509  Weight: 77.6 kg 77.6 kg 79.4 kg    Examination:  General this  is a 68 year old white male he is resting in bed but quite labored currently HEENT normocephalic atraumatic he does have slightly icteric sclera today compared to yesterday Pulmonary: Coarse scattered rhonchi more tachypneic Cardiac tricuspid murmur regular irregular heart rhythm Extremities lower extremity edema pulses palpable Neuro awake restless, no focal deficits GU clear yellow   Resolved Hospital Problem list     Assessment & Plan:  Acute hypoxic resp failure in setting of bacteremia/presumtive endocarditis and new bilateral patchy pulmonary infiltrates (edema vs evolving ALI or PNA) -Ct chest personally reviewed: new diffuse patchy GG infiltrates. Multiple mediastinal LNs, small bilateral effusions.  -oxygen requirements: Worsening Plan Continue to wean oxygen Pulse oximetry Agree volume assessment would be helpful  History of sleep apnea Plan Nocturnal CPAP  Severe Sepsis in setting of enterococcus bacteremia/TV endocarditis  -has been hemodynamically stable w/out any hypotension -ID now following -TEE held due to INR and hypoxia; I am not sure how much help a TEE would be at this point given we have identified clot on TTE Plan Awaiting follow-up cultures Day #3 ampicillin and #2 Rocephin as directed by infectious disease  Mechanical AVR 7 years ago. (on chronic AC) Plan Continue aspirin Continue telemetry  Chronic AF Plan Continuing rate control Holding anticoagulation, I think will be better served by transitioning to IV heparin given his declining clinical status once  INR normalized  Coumadin related coagulopathy Plan Holding warfarin  Worsening AKI (looks like has CKD at baseline) now with hyperkalemia I&O balance is negative Renal ultrasound was negative Serum creatinine continues to worsen with diuresis Plan He is already received Kayexalate, will repeat chemistry this morning may need [facility] Nephrology consult pending suspect he will need dialysis  We will need to better assess volume status, central access will be risky given elevated INR  Hyponatremia->corrects to 135 w/ glucose, stable Plan Adjusting glycemic control Will likely need dialysis Serial chemistries   DM2 with hyperglycemia Glycemic control is poor.  We did discontinue Solu-Medrol yesterday. Plan We will increase Lantus, and change lunchtime coverage  Normocytic anemia Hemoglobin is been stable for 3 days now Plan Transfuse for hemoglobin less than 7 Serial CBCs  Goals of care:  Per Dr Elsie Stain discussion 9/24 "Did discuss at length with pt and his wife. Challenging as pt's wife discloses that she does not like to talk about it but does understand that it is a necessary conversation. Pt is clear he would want to be intubated for short period if able to recover. He would want to be FULL code but only one round and if he were to code again he would want to be let go peacefully. I encouraged them to have a more open conversation abotut this as the pt wife was appropriately emotional with the challenging topic. Nonetheless he remains full code".   Best practice Diet: NPO DVT: Warfarin (on hold->transitioning to Heparin) Activity: bedrest SUP: NA   My cct 45 minutes.  Simonne Martinet ACNP-BC [facility] Pulmonary/Critical Care Pager # [phone] OR # [phone] if no answer\

## 2019-08-31 NOTE — Consult Note (Signed)
Renal Service Consult Note [name]  [name] 08/31/2019 [name] Requesting Physician:  Dr Tamala Julian, D.   Reason for Consult:  AKI HPI: The patient is a 68 y.o. year-old with hx of DM2, PNA, MI, HL, HTN , CHF and afib, hx CABG/ MAZE and mechanical AVR who presented w/ SOB. Has hx of recurrent admits since April for renal failure and PNA reportedly.  CXR showed CHF and pt was admitted and given IV lasix. Was having fevers too, and possible dental infection.  Blood cx's returned + for enterococcus and TV showed a vegetation.  Renal function has worsened and pt transferred to ICU today for worsening resp status and placed on bipap.  CXR diffuse near white-out w/ diffuse bilat infiltrates. Asked to see for AKI.   Creat was 1.82 on admit 9/24 and is up to 4.63 this am on 9/26.  UOP was 500cc  On 9/24, 175 cc yest and 50 cc today so far.  Patient has no specific c/o's. Last creat was in 2013 at 0.9.  No recent creat here.   BP's ok here, no shock, no BP's < 100.     ROS  denies CP  no joint pain   no HA  no blurry vision  no rash  no diarrhea  no nausea/ vomiting   Past Medical History  Past Medical History:  Diagnosis Date  . Aortic stenosis   . Atrial fibrillation ([place])    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 February 2013  . CHF (congestive heart failure) ([place])   . Coronary atherosclerosis of native coronary artery    2 vessel s/p CABG initial surgery in 2004, status post redo coronary bypass grafting x 3 2013  . Essential hypertension   . H/O hiatal hernia   . Hypothyroidism   . Mixed hyperlipidemia   . Myocardial infarction ([place]) 1998  . Osteoarthritis   . Pneumonia 2010  . Rotator cuff tear    Right shoulder  . Sleep apnea    wears CPAP nightly  . Type 2 diabetes mellitus [facility])    Past Surgical History  Past Surgical History:  Procedure Laterality Date  . AORTIC VALVE REPLACEMENT  01/12/2012   Procedure: AORTIC VALVE  REPLACEMENT (AVR);  Surgeon: Rexene Alberts, MD;  Location: [place];  Service: Open Heart Surgery;  Laterality: N/A;  NO NECKLINE ON THE RIGHT  . APPENDECTOMY    . CARDIAC CATHETERIZATION  12/21/2011  . CARDIOVERSION  11/18/2011  . CHOLECYSTECTOMY    . CORONARY ARTERY BYPASS GRAFT  04/02/2003   Dr. Roxy Manns - SVG to OM, composite SVG/RIMA to PDA  . CORONARY ARTERY BYPASS GRAFT  01/12/2012   Procedure: REDO CORONARY ARTERY BYPASS GRAFTING (CABG);  Surgeon: Rexene Alberts, MD;  Location: [place];  Service: Open Heart Surgery;  Laterality: N/A;  Three grafts; Endoscopically harvested left saphenous vein graft and left internal mammary artery  . LEFT AND RIGHT HEART CATHETERIZATION WITH CORONARY ANGIOGRAM N/A 12/21/2011   Procedure: LEFT AND RIGHT HEART CATHETERIZATION WITH CORONARY ANGIOGRAM;  Surgeon: Wellington Hampshire, MD;  Location: [place] CATH LAB;  Service: Cardiovascular;  Laterality: N/A;  . MAZE  01/12/2012   Procedure: MAZE;  Surgeon: Rexene Alberts, MD;  Location: [place];  Service: Open Heart Surgery;  Laterality: N/A;  NO NECKLINE ON THE RIGHT  . TONSILLECTOMY     Family History  Family History  Problem Relation Age of Onset  . Cancer Father  Social History  reports that he quit smoking about 36 years ago. His smoking use included cigarettes. He has a 42.00 pack-year smoking history. He has never used smokeless tobacco. He reports that he does not drink alcohol or use drugs. Allergies No Known Allergies Home medications Prior to Admission medications   Medication Sig Start Date End Date Taking? Authorizing Provider  albuterol (PROAIR HFA) 108 (90 Base) MCG/ACT inhaler Inhale 1-2 puffs into the lungs as needed for wheezing or shortness of breath. 08/22/19  Yes Jonelle SidleMcDowell, Samuel G, MD  allopurinol (ZYLOPRIM) 300 MG tablet Take 300 mg by mouth daily.   Yes [provider]  amLODipine (NORVASC) 5 MG tablet Take 1 tablet (5 mg total) by mouth daily. 10/22/12  Yes Jonelle SidleMcDowell, Samuel G, MD   amoxicillin-clavulanate (AUGMENTIN) 500-125 MG tablet Take 1 tablet by mouth 2 (two) times daily. 08/23/19  Yes [provider]  aspirin 81 MG tablet Take 81 mg by mouth daily.   Yes [provider]  atorvastatin (LIPITOR) 20 MG tablet Take 1 tablet (20 mg total) by mouth daily. 10/22/12  Yes Jonelle SidleMcDowell, Samuel G, MD  furosemide (LASIX) 40 MG tablet Take 1 tablet (40 mg total) by mouth daily. 10/22/12  Yes Jonelle SidleMcDowell, Samuel G, MD  glipiZIDE (GLUCOTROL) 10 MG tablet Take 1 tablet (10 mg total) by mouth 2 (two) times daily before a meal. 08/22/19  Yes Jonelle SidleMcDowell, Samuel G, MD  Insulin Glargine, 1 Unit Dial, (TOUJEO SOLOSTAR) 300 UNIT/ML SOPN Inject 10 Units into the skin daily. Patient taking differently: Inject 10 Units into the skin at bedtime.  08/22/19  Yes Jonelle SidleMcDowell, Samuel G, MD  iron polysaccharides (NIFEREX) 150 MG capsule Take 150 mg by mouth daily.   Yes [provider]  levothyroxine (SYNTHROID, LEVOTHROID) 75 MCG tablet Take 75 mcg by mouth daily.   Yes [provider]  linagliptin (TRADJENTA) 5 MG TABS tablet Take 1 tablet (5 mg total) by mouth daily. 08/22/19  Yes Jonelle SidleMcDowell, Samuel G, MD  nitroGLYCERIN (NITROSTAT) 0.4 MG SL tablet Place 0.4 mg under the tongue every 5 (five) minutes as needed. For chest pain   Yes [provider]  Tamsulosin HCl (FLOMAX) 0.4 MG CAPS Take 0.4 mg by mouth daily.    Yes [provider]  warfarin (COUMADIN) 5 MG tablet Take 0.5 tab (2.5 mg) daily or as directed by [facility] Patient taking differently: Take 2.5-5 mg by mouth See admin instructions. Take 5mg  on TUE and FRI, and 2.5mg  on all other days. 01/16/12  Yes Newt LukesCollins, Gina L, PA-C   Liver Function Tests Recent Labs  Lab 08/29/19 1601 08/30/19 0405 08/31/19 0516  AST 39 30 45*  ALT 37 35 44  ALKPHOS 110 98 105  BILITOT 1.5* 1.0 0.9  PROT 6.8 6.5 6.5  ALBUMIN 2.8* 2.6* 2.7*   No results for input(s): LIPASE, AMYLASE in the last 168  hours. CBC Recent Labs  Lab 08/29/19 0811 08/30/19 0405 08/31/19 0516  WBC 20.1* 17.1* 38.9*  NEUTROABS 16.8*  --   --   HGB 10.4* 9.8* 9.1*  HCT 32.3* 29.5* 26.5*  MCV 94.2 91.6 92.0  PLT 414* 415* 471*   Basic Metabolic Panel Recent Labs  Lab 12-13-18 1654 08/29/19 0300 08/29/19 2218 08/30/19 0405 08/30/19 0706 08/30/19 1001 08/30/19 1742 08/31/19 0516 08/31/19 1238  NA 133* 132*  --  131*  --  133* 131* 131* 134*  K 4.0 4.7  --  5.0  --  4.7 5.8* 6.0* 5.1  CL  96* 97*  --  96*  --  97* 95* 94* 95*  CO2 24 23  --  25  --  22 17* 23 21*  GLUCOSE 229* 209* 404* 383* 364* 343* 358* 307* 219*  BUN 34* 36*  --  53*  --  59* 74* 88* 97*  CREATININE 1.97* 1.82*  --  2.31*  --  2.61* 3.25* 4.22* 4.63*  CALCIUM 8.6* 8.4*  --  8.7*  --  8.9 8.8* 9.0 9.0   Iron/TIBC/Ferritin/ %Sat No results found for: IRON, TIBC, FERRITIN, IRONPCTSAT  Vitals:   08/31/19 0917 08/31/19 1300 08/31/19 1302 08/31/19 1400  BP: (!) 110/39  (!) 120/49 (!) 111/45  Pulse: 64 65 60 63  Resp: 20 (!) 26 (!) 25 (!) 23  Temp:      TempSrc:      SpO2: 96% 96% 96% 97%  Weight:      Height:        Exam Gen alert, on bipap, not able to speak much, mild ^wob No rash, cyanosis or gangrene Sclera anicteric, throat not seen  No jvd or bruits Chest diffuse bilat rhonchi/ rales RRR no MRG Abd soft ntnd no mass or ascites +bs GU normal male MS no joint effusions or deformity Ext diffuse 2-3+ LE edema, no wounds or ulcers Neuro is alert, Ox 3 , nf    Home meds:  - amlodipine 5 qd/ furosemide 40 qd  - sl ntg prn/ atorvastatin 20/ aspirin 81   - allopurinol 300/ levothyroxine 75 ug/ tamsulosin 0.4  - insulin glargine 10 qd/ linagliptin 5 qd/ glipizide 10 bid  - prn's/ vitamins/ supplements     No UA done   Renal US normal kidneys no hydro    Assessment/ Plan: 1. Renal failure - acute and possibly subacute, in setting of TV endocarditis and enterococcal bacteremia. UOP dropping off and  creatinine rising daily.  Needs UA, urine lytes.  Renal US wnl. Needs CRRT.  Orders written. INR is up so no anticoag needed now.  Max UF early on. Pt sig vol excess on exam but also some of his resp distress may be due to infection/ septic pna.  Will follow.  2. 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 DM2 4. HTN - bp's low normal here, no need for BP lowering meds 5. Hx mech AVR 6. CAD sp CABG 7. Atrial fib / hx MAZE      Vinson Moselle  MD 08/31/2019, 2:46 PM

## 2019-08-31 NOTE — Progress Notes (Signed)
Progress Note  Patient Name: [name] Date of Encounter: 08/31/2019  Primary Cardiologist:   Nona Dell, MD   Subjective    He feels like he getting enough O2 with the BiPAP.  No acute pain  Inpatient Medications    Scheduled Meds: . amLODipine  5 mg Oral Daily  . aspirin  81 mg Oral Daily  . atorvastatin  20 mg Oral Daily  . Chlorhexidine Gluconate Cloth  6 each Topical Daily  . feeding supplement (GLUCERNA SHAKE)  237 mL Oral TID BM  . insulin aspart  0-20 Units Subcutaneous TID WC  . insulin aspart  0-5 Units Subcutaneous QHS  . insulin aspart  5 Units Subcutaneous TID WC  . insulin glargine  10 Units Subcutaneous BID  . iron polysaccharides  150 mg Oral Daily  . levothyroxine  75 mcg Oral Q0600  . multivitamin with minerals  1 tablet Oral Daily  . tamsulosin  0.4 mg Oral Daily  . Thrombi-Pad  1 each Topical Once   Continuous Infusions: .  prismasol BGK 4/2.5    .  prismasol BGK 4/2.5    . ampicillin (OMNIPEN) IV Stopped (08/31/19 1610)  . cefTRIAXone (ROCEPHIN)  IV 2 g (08/31/19 1058)  . prismasol BGK 4/2.5     PRN Meds: alteplase, guaiFENesin-dextromethorphan, heparin, ipratropium-albuterol, nitroGLYCERIN, sodium chloride   Vital Signs    Vitals:   08/31/19 0917 08/31/19 1300 08/31/19 1302 08/31/19 1400  BP: (!) 110/39  (!) 120/49 (!) 111/45  Pulse: 64 65 60 63  Resp: 20 (!) 26 (!) 25 (!) 23  Temp:      TempSrc:      SpO2: 96% 96% 96% 97%  Weight:      Height:        Intake/Output Summary (Last 24 hours) at 08/31/2019 1554 Last data filed at 08/31/2019 1400 Gross per 24 hour  Intake 1473.58 ml  Output 225 ml  Net 1248.58 ml   Filed Weights   08/29/19 1232 08/30/19 0601 08/31/19 0509  Weight: 77.6 kg 77.6 kg 79.4 kg    Telemetry    Atrial fib - Personally Reviewed  ECG    NA - Personally Reviewed  Physical Exam   GEN: No acute distress.   Neck: No  JVD Cardiac: Irregular RR, no murmurs (distant heart sounds), rubs, or  gallops.  Respiratory:     Decreased breath  GI: Soft, nontender, non-distended  MS: No  edema; No deformity. Neuro:  Nonfocal  Psych: Normal affect   Labs    Chemistry Recent Labs  Lab 08/29/19 1601  08/30/19 0405  08/30/19 1742 08/31/19 0516 08/31/19 1238  NA  --   --  131*   < > 131* 131* 134*  K  --   --  5.0   < > 5.8* 6.0* 5.1  CL  --   --  96*   < > 95* 94* 95*  CO2  --   --  25   < > 17* 23 21*  GLUCOSE  --    < > 383*   < > 358* 307* 219*  BUN  --   --  53*   < > 74* 88* 97*  CREATININE  --   --  2.31*   < > 3.25* 4.22* 4.63*  CALCIUM  --   --  8.7*   < > 8.8* 9.0 9.0  PROT 6.8  --  6.5  --   --  6.5  --  ALBUMIN 2.8*  --  2.6*  --   --  2.7*  --   AST 39  --  30  --   --  45*  --   ALT 37  --  35  --   --  44  --   ALKPHOS 110  --  98  --   --  105  --   BILITOT 1.5*  --  1.0  --   --  0.9  --   GFRNONAA  --   --  28*   < > 18* 13* 12*  GFRAA  --   --  32*   < > 21* 16* 14*  ANIONGAP  --   --  10   < > 19* 14 18*   < > = values in this interval not displayed.     Hematology Recent Labs  Lab 08/29/19 0811 08/30/19 0405 08/31/19 0516  WBC 20.1* 17.1* 38.9*  RBC 3.43* 3.22* 2.88*  HGB 10.4* 9.8* 9.1*  HCT 32.3* 29.5* 26.5*  MCV 94.2 91.6 92.0  MCH 30.3 30.4 31.6  MCHC 32.2 33.2 34.3  RDW 13.4 13.3 13.7  PLT 414* 415* 471*    Cardiac EnzymesNo results for input(s): TROPONINI in the last 168 hours. No results for input(s): TROPIPOC in the last 168 hours.   BNP Recent Labs  Lab 08/12/2019 1846 08/29/19 0811  BNP 389.1* 546.0*     DDimer  Recent Labs  Lab 08/29/19 1601  DDIMER 4.27*     Radiology    Ct Chest Wo Contrast  Result Date: 08/30/2019 CLINICAL DATA:  Dyspnea and shortness of breath. EXAM: CT CHEST WITHOUT CONTRAST TECHNIQUE: Multidetector CT imaging of the chest was performed following the standard protocol without IV contrast. COMPARISON:  Radiograph earlier this day. [facility] FINDINGS:  Cardiovascular: Prosthetic aortic valve. There is aortic atherosclerosis. Post CABG with calcification of native coronary arteries. Borderline cardiomegaly. No pericardial effusion. Mediastinum/Nodes: Multiple prominent mediastinal lymph nodes are new from prior exam likely reactive. For example lower paratracheal node measures 12 mm short axis. Decompressed esophagus. No visualized thyroid nodule. Lungs/Pleura: Patchy ground-glass opacities throughout both lungs, new from prior exam. Mild smooth septal thickening at the apices. There are small bilateral pleural effusions and fluid in the fissures. Pleural calcifications in the left dependent lower lobe. Trachea and mainstem bronchi are patent. Upper Abdomen: Vascular calcifications. No acute findings. Cyst in the upper right kidney again seen. Musculoskeletal: Median sternotomy. There are no acute or suspicious osseous abnormalities. IMPRESSION: 1. Multifocal bilateral ground-glass opacities throughout both lungs. In the setting of pleural effusions and septal thickening, findings favor pulmonary edema or atypical infection. 2. Small bilateral pleural effusions and fluid in the fissures. Aortic Atherosclerosis (ICD10-I70.0). Electronically Signed   By: Narda RutherfordMelanie  Sanford M.D.   On: 08/30/2019 03:01   [place] Renal  Result Date: 08/30/2019 CLINICAL DATA:  Acute kidney injury. EXAM: RENAL / URINARY TRACT ULTRASOUND COMPLETE COMPARISON:  Ultrasound 06/05/2019.  Abdominal CT 08/07/2019 FINDINGS: Right Kidney: Renal measurements: 10.2 x 5.8 x 6.5 cm = volume: 201 mL. Simple cyst in the upper kidney measuring 3.8 cm, unchanged. Echogenicity within normal limits. No solid mass or hydronephrosis visualized. Left Kidney: Renal measurements: 9.6 x 4.9 x 5.5 cm = volume: 136 mL. Echogenicity within normal limits. No mass or hydronephrosis visualized. Bladder: Appears normal for degree of bladder distention. IMPRESSION: Unremarkable renal ultrasound.  Unchanged right renal cyst.  Electronically Signed   By: Shawna OrleansMelanie  Sanford M.D.   On: 08/30/2019 21:58   Dg Chest Port 1 View  Result Date: 08/31/2019 CLINICAL DATA:  Central line placement EXAM: PORTABLE CHEST 1 VIEW COMPARISON:  August 31, 2019 FINDINGS: New right central line is been placed with distal tip central SVC. No pneumothorax. Diffuse bilateral interstitial opacities remain, similar in the upper lobes. Opacity is more focal in the lateral right lung base left lung base, worsened in the interval. No other interval changes. IMPRESSION: 1. The new right central line is in good position with no pneumothorax. 2. Bilateral focal pulmonary infiltrates have worsened in the interval. Given the superimposed diffuse interstitial opacities, findings could represent pulmonary edema. However, the findings are concerning for bibasilar multifocal pneumonia. Recommend correlation. Electronically Signed   By: Dorise Bullion III M.D   On: 08/31/2019 13:05   Dg Chest Port 1 View  Result Date: 08/31/2019 CLINICAL DATA:  Shortness of breath, tachypnea. EXAM: PORTABLE CHEST 1 VIEW COMPARISON:  Radiograph of 09/15/19. FINDINGS: Stable cardiomediastinal silhouette. Status post coronary bypass graft. No pneumothorax is noted. Mildly increased bilateral diffuse lung opacities are noted concerning for worsening edema or multifocal pneumonia. Small bilateral pleural effusions are noted. Bony thorax is unremarkable. IMPRESSION: Increased bilateral lung opacities are noted concerning for worsening edema or multifocal pneumonia. Small pleural effusions are noted. Electronically Signed   By: Marijo Conception M.D.   On: 08/31/2019 09:13    Cardiac Studies   ECHO   1. The tricuspid valve is normal in structure. Tricuspid valve regurgitation is trivial. There is a 1.7 cm mobile mass attached to the tricuspid valve and prolapsing back and forth across the valve, concern for endocarditis. Recommend TEE to assess  tricuspid valve.  2. Left  ventricular ejection fraction, by visual estimation, is 50 to 55%. The left ventricle has mildly decreased function. Normal left ventricular size. There is no left ventricular hypertrophy. Basal inferior and inferolateral akinesis.  3. Left ventricular diastolic Doppler parameters are indeterminate pattern of LV diastolic filling.  4. Definity contrast agent was given IV to delineate the left ventricular endocardial borders.  5. Mild mitral annular calcification.  6. The mitral valve is normal in structure. Mild to moderate mitral valve regurgitation. No evidence of mitral stenosis.  7. Global right ventricle has mildly reduced systolic function.The right ventricular size is normal. No increase in right ventricular wall thickness.  8. Left atrial size was moderately dilated.  9. Mechanical aortic valve replacement. Mild peri-valvular regurgitation. Mean gradient elevated at 24 mmHg suggesting at least mild patient-prosthesis mismatch. 10. Right atrial size was normal. 11. The inferior vena cava is dilated in size with >50% respiratory variability, suggesting right atrial pressure of 8 mmHg. 12. TR signal is inadequate for assessing pulmonary artery systolic pressure.  Patient Profile     68 y.o. 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   On antibiotics.    ACUTE RESPIRATORY FAILURE:    Minimal urine output yesterday.  No response to Lasix.  He is to get CRRT today.  CVP is 9     PAF:  INR is elevated.  Holding off on reversing warfarin unless INR continues to climb.  MECHANICAL AVR:  Anticoagulated as above.    AKI:   Renal consulted and plans for CRRT.       For questions or updates, please contact [facility] Please consult [email or web address] for contact info under Cardiology/STEMI.   Signed, Rollene Rotunda, MD  08/31/2019, 3:54 PM

## 2019-08-31 NOTE — Procedures (Signed)
Central Venous dialysis Catheter Insertion Procedure Note [name] [id number] 10-Jan-1951  Procedure: Insertion of Central Venous Catheter Indications: cvp evaluation, SVO2 eval and HD  Procedure Details Consent: Risks of procedure as well as the alternatives and risks of each were explained to the (patient/caregiver).  Consent for procedure obtained. Time Out: Verified patient identification, verified procedure, site/side was marked, verified correct patient position, special equipment/implants available, medications/allergies/relevent history reviewed, required imaging and test results available.  Performed US used to ID and cannulate vessel  Maximum sterile technique was used including antiseptics, cap, gloves, gown, hand hygiene, mask and sheet. Skin prep: Chlorhexidine; local anesthetic administered A antimicrobial bonded/coated triple lumen catheter was placed in the right internal jugular vein using the Seldinger technique.  Evaluation Blood flow good Complications: No apparent complications Patient did tolerate procedure well. Chest X-ray ordered to verify placement.  CXR: pending.  [name] 08/31/2019, 12:00 PM  .Erick Colace ACNP-BC [place] Pager # [phone] OR # [phone] if no answer

## 2019-08-31 NOTE — Progress Notes (Signed)
Pt . transfered to 2H17 , wife made aware.report given to [facility]

## 2019-09-01 ENCOUNTER — Inpatient Hospital Stay (HOSPITAL_COMMUNITY): Payer: BC Managed Care – PPO

## 2019-09-01 ENCOUNTER — Inpatient Hospital Stay (HOSPITAL_COMMUNITY): Payer: BC Managed Care – PPO | Admitting: Anesthesiology

## 2019-09-01 DIAGNOSIS — R0603 Acute respiratory distress: Secondary | ICD-10-CM

## 2019-09-01 DIAGNOSIS — I361 Nonrheumatic tricuspid (valve) insufficiency: Secondary | ICD-10-CM

## 2019-09-01 DIAGNOSIS — I371 Nonrheumatic pulmonary valve insufficiency: Secondary | ICD-10-CM

## 2019-09-01 LAB — CBC
HCT: 24 % — ABNORMAL LOW (ref 39.0–52.0)
Hemoglobin: 7.7 g/dL — ABNORMAL LOW (ref 13.0–17.0)
MCH: 30.9 pg (ref 26.0–34.0)
MCHC: 32.1 g/dL (ref 30.0–36.0)
MCV: 96.4 fL (ref 80.0–100.0)
Platelets: 581 10*3/uL — ABNORMAL HIGH (ref 150–400)
RBC: 2.49 MIL/uL — ABNORMAL LOW (ref 4.22–5.81)
RDW: 14.2 % (ref 11.5–15.5)
WBC: 52.5 10*3/uL (ref 4.0–10.5)
nRBC: 0.2 % (ref 0.0–0.2)

## 2019-09-01 LAB — POCT I-STAT 7, (LYTES, BLD GAS, ICA,H+H)
Acid-base deficit: 8 mmol/L — ABNORMAL HIGH (ref 0.0–2.0)
Acid-base deficit: 5 mmol/L — ABNORMAL HIGH (ref 0.0–2.0)
Acid-base deficit: 6 mmol/L — ABNORMAL HIGH (ref 0.0–2.0)
Bicarbonate: 19.6 mmol/L — ABNORMAL LOW (ref 20.0–28.0)
Bicarbonate: 22.5 mmol/L (ref 20.0–28.0)
Bicarbonate: 22.4 mmol/L (ref 20.0–28.0)
Calcium, Ion: 1.08 mmol/L — ABNORMAL LOW (ref 1.15–1.40)
Calcium, Ion: 1.05 mmol/L — ABNORMAL LOW (ref 1.15–1.40)
Calcium, Ion: 1.09 mmol/L — ABNORMAL LOW (ref 1.15–1.40)
HCT: 33 % — ABNORMAL LOW (ref 39.0–52.0)
HCT: 23 % — ABNORMAL LOW (ref 39.0–52.0)
HCT: 24 % — ABNORMAL LOW (ref 39.0–52.0)
Hemoglobin: 11.2 g/dL — ABNORMAL LOW (ref 13.0–17.0)
Hemoglobin: 7.8 g/dL — ABNORMAL LOW (ref 13.0–17.0)
Hemoglobin: 8.2 g/dL — ABNORMAL LOW (ref 13.0–17.0)
O2 Saturation: 94 %
O2 Saturation: 94 %
O2 Saturation: 98 %
Patient temperature: 97.6
Patient temperature: 98.4
Patient temperature: 98.4
Potassium: 4.7 mmol/L (ref 3.5–5.1)
Potassium: 5.2 mmol/L — ABNORMAL HIGH (ref 3.5–5.1)
Potassium: 5.7 mmol/L — ABNORMAL HIGH (ref 3.5–5.1)
Sodium: 134 mmol/L — ABNORMAL LOW (ref 135–145)
Sodium: 137 mmol/L (ref 135–145)
Sodium: 135 mmol/L (ref 135–145)
TCO2: 21 mmol/L — ABNORMAL LOW (ref 22–32)
TCO2: 24 mmol/L (ref 22–32)
TCO2: 24 mmol/L (ref 22–32)
pCO2 arterial: 44.8 mmHg (ref 32.0–48.0)
pCO2 arterial: 51.8 mmHg — ABNORMAL HIGH (ref 32.0–48.0)
pCO2 arterial: 59.2 mmHg — ABNORMAL HIGH (ref 32.0–48.0)
pH, Arterial: 7.245 — ABNORMAL LOW (ref 7.350–7.450)
pH, Arterial: 7.245 — ABNORMAL LOW (ref 7.350–7.450)
pH, Arterial: 7.185 — CL (ref 7.350–7.450)
pO2, Arterial: 82 mmHg — ABNORMAL LOW (ref 83.0–108.0)
pO2, Arterial: 85 mmHg (ref 83.0–108.0)
pO2, Arterial: 139 mmHg — ABNORMAL HIGH (ref 83.0–108.0)

## 2019-09-01 LAB — GLUCOSE, CAPILLARY
Glucose-Capillary: 212 mg/dL — ABNORMAL HIGH (ref 70–99)
Glucose-Capillary: 173 mg/dL — ABNORMAL HIGH (ref 70–99)
Glucose-Capillary: 190 mg/dL — ABNORMAL HIGH (ref 70–99)
Glucose-Capillary: 130 mg/dL — ABNORMAL HIGH (ref 70–99)
Glucose-Capillary: 148 mg/dL — ABNORMAL HIGH (ref 70–99)
Glucose-Capillary: 124 mg/dL — ABNORMAL HIGH (ref 70–99)
Glucose-Capillary: 125 mg/dL — ABNORMAL HIGH (ref 70–99)

## 2019-09-01 LAB — TYPE AND SCREEN
ABO/RH(D): A POS
Antibody Screen: NEGATIVE

## 2019-09-01 LAB — RENAL FUNCTION PANEL
Albumin: 2.3 g/dL — ABNORMAL LOW (ref 3.5–5.0)
Anion gap: 12 (ref 5–15)
BUN: 47 mg/dL — ABNORMAL HIGH (ref 8–23)
CO2: 20 mmol/L — ABNORMAL LOW (ref 22–32)
Calcium: 7.3 mg/dL — ABNORMAL LOW (ref 8.9–10.3)
Chloride: 106 mmol/L (ref 98–111)
Creatinine, Ser: 2.71 mg/dL — ABNORMAL HIGH (ref 0.61–1.24)
GFR calc Af Amer: 27 mL/min — ABNORMAL LOW (ref >60)
GFR calc non Af Amer: 23 mL/min — ABNORMAL LOW (ref >60)
Glucose, Bld: 161 mg/dL — ABNORMAL HIGH (ref 70–99)
Phosphorus: 4.7 mg/dL — ABNORMAL HIGH (ref 2.5–4.6)
Potassium: 5.3 mmol/L — ABNORMAL HIGH (ref 3.5–5.1)
Sodium: 138 mmol/L (ref 135–145)

## 2019-09-01 LAB — PROTIME-INR
INR: >10 (ref 0.8–1.2)
INR: >10 (ref 0.8–1.2)
Prothrombin Time: 80.9 seconds — ABNORMAL HIGH (ref 11.4–15.2)
Prothrombin Time: 89.6 seconds — ABNORMAL HIGH (ref 11.4–15.2)

## 2019-09-01 LAB — ECHOCARDIOGRAM LIMITED
Height: 68 in
Weight: 2723.12 oz

## 2019-09-01 LAB — URINE CULTURE: Culture: NO GROWTH

## 2019-09-01 LAB — COMPREHENSIVE METABOLIC PANEL
ALT: 97 U/L — ABNORMAL HIGH (ref 0–44)
AST: 127 U/L — ABNORMAL HIGH (ref 15–41)
Albumin: 2.5 g/dL — ABNORMAL LOW (ref 3.5–5.0)
Alkaline Phosphatase: 145 U/L — ABNORMAL HIGH (ref 38–126)
Anion gap: 20 — ABNORMAL HIGH (ref 5–15)
BUN: 77 mg/dL — ABNORMAL HIGH (ref 8–23)
CO2: 18 mmol/L — ABNORMAL LOW (ref 22–32)
Calcium: 7.9 mg/dL — ABNORMAL LOW (ref 8.9–10.3)
Chloride: 98 mmol/L (ref 98–111)
Creatinine, Ser: 4.2 mg/dL — ABNORMAL HIGH (ref 0.61–1.24)
GFR calc Af Amer: 16 mL/min — ABNORMAL LOW (ref >60)
GFR calc non Af Amer: 13 mL/min — ABNORMAL LOW (ref >60)
Glucose, Bld: 245 mg/dL — ABNORMAL HIGH (ref 70–99)
Potassium: 5.2 mmol/L — ABNORMAL HIGH (ref 3.5–5.1)
Sodium: 136 mmol/L (ref 135–145)
Total Bilirubin: 1.2 mg/dL (ref 0.3–1.2)
Total Protein: 6.2 g/dL — ABNORMAL LOW (ref 6.5–8.1)

## 2019-09-01 LAB — LACTATE DEHYDROGENASE
LDH: 948 U/L — ABNORMAL HIGH (ref 98–192)
LDH: 998 U/L — ABNORMAL HIGH (ref 98–192)

## 2019-09-01 LAB — PHOSPHORUS
Phosphorus: 6.4 mg/dL — ABNORMAL HIGH (ref 2.5–4.6)
Phosphorus: 5.9 mg/dL — ABNORMAL HIGH (ref 2.5–4.6)
Phosphorus: 4.6 mg/dL (ref 2.5–4.6)

## 2019-09-01 LAB — TROPONIN I (HIGH SENSITIVITY)
Troponin I (High Sensitivity): 1578 ng/L (ref <18)
Troponin I (High Sensitivity): 1632 ng/L (ref <18)

## 2019-09-01 LAB — MAGNESIUM
Magnesium: 2.6 mg/dL — ABNORMAL HIGH (ref 1.7–2.4)
Magnesium: 2.7 mg/dL — ABNORMAL HIGH (ref 1.7–2.4)
Magnesium: 2.3 mg/dL (ref 1.7–2.4)

## 2019-09-01 LAB — FIBRINOGEN: Fibrinogen: 681 mg/dL — ABNORMAL HIGH (ref 210–475)

## 2019-09-01 MED ORDER — SODIUM CHLORIDE 0.9% FLUSH
10.0000 mL | Freq: Two times a day (BID) | INTRAVENOUS | Status: DC
  Administered 2019-09-01: 10 mL
  Administered 2019-09-01: 30 mL

## 2019-09-01 MED ORDER — MIDAZOLAM HCL 2 MG/2ML IJ SOLN
INTRAMUSCULAR | Status: AC
  Administered 2019-09-01: 02:00:00 2 mg
  Filled 2019-09-01: qty 4

## 2019-09-01 MED ORDER — HYDROCORTISONE NA SUCCINATE PF 100 MG IJ SOLR
50.0000 mg | Freq: Four times a day (QID) | INTRAMUSCULAR | Status: DC
  Administered 2019-09-01 – 2019-09-02 (×5): 50 mg via INTRAVENOUS
  Filled 2019-09-01 (×5): qty 2

## 2019-09-01 MED ORDER — FENTANYL 2500MCG IN NS 250ML (10MCG/ML) PREMIX INFUSION
25.0000 ug/h | INTRAVENOUS | Status: DC
  Administered 2019-09-01: 100 ug/h via INTRAVENOUS
  Administered 2019-09-01: 300 ug/h via INTRAVENOUS
  Administered 2019-09-01 – 2019-09-02 (×2): 200 ug/h via INTRAVENOUS
  Filled 2019-09-01 (×3): qty 250

## 2019-09-01 MED ORDER — PANTOPRAZOLE SODIUM 40 MG IV SOLR
40.0000 mg | Freq: Every day | INTRAVENOUS | Status: DC
  Administered 2019-09-01: 40 mg via INTRAVENOUS
  Filled 2019-09-01: qty 40

## 2019-09-01 MED ORDER — FENTANYL CITRATE (PF) 100 MCG/2ML IJ SOLN: 25.0000 ug | INTRAMUSCULAR | Status: DC | PRN

## 2019-09-01 MED ORDER — ORAL CARE MOUTH RINSE
15.0000 mL | OROMUCOSAL | Status: DC
  Administered 2019-09-01 – 2019-09-02 (×12): 15 mL via OROMUCOSAL

## 2019-09-01 MED ORDER — SODIUM CHLORIDE 0.9% FLUSH
10.0000 mL | INTRAVENOUS | Status: DC | PRN
  Administered 2019-09-01: 05:00:00 10 mL

## 2019-09-01 MED ORDER — NOREPINEPHRINE 16 MG/250ML-% IV SOLN
0.0000 ug/min | INTRAVENOUS | Status: DC
  Administered 2019-09-01: 40 ug/min via INTRAVENOUS
  Administered 2019-09-01: 09:00:00 22 ug/min via INTRAVENOUS
  Administered 2019-09-02: 08:00:00 38 ug/min via INTRAVENOUS
  Administered 2019-09-02: 01:00:00 40 ug/min via INTRAVENOUS
  Filled 2019-09-01 (×4): qty 250

## 2019-09-01 MED ORDER — SODIUM CHLORIDE 0.9 % IV SOLN
Freq: Once | INTRAVENOUS | Status: AC
  Administered 2019-09-01: 500 mL via INTRAVENOUS

## 2019-09-01 MED ORDER — SODIUM CHLORIDE 0.9 % IV SOLN
1.2500 ng/kg/min | INTRAVENOUS | Status: DC
  Administered 2019-09-01: 5 ng/kg/min via INTRAVENOUS
  Filled 2019-09-01: qty 1

## 2019-09-01 MED ORDER — SUCCINYLCHOLINE CHLORIDE 20 MG/ML IJ SOLN
1.5000 mg/kg | Freq: Once | INTRAMUSCULAR | Status: AC
  Administered 2019-09-01: 02:00:00 5.96 mg via INTRAVENOUS
  Filled 2019-09-01: qty 5.96

## 2019-09-01 MED ORDER — MIDAZOLAM HCL 2 MG/2ML IJ SOLN
INTRAMUSCULAR | Status: DC | PRN
  Administered 2019-09-01: 1 mg via INTRAVENOUS

## 2019-09-01 MED ORDER — VASOPRESSIN 20 UNIT/ML IV SOLN
0.0300 [IU]/min | INTRAVENOUS | Status: DC
  Administered 2019-09-01 – 2019-09-02 (×2): 0.03 [IU]/min via INTRAVENOUS
  Filled 2019-09-01 (×2): qty 2

## 2019-09-01 MED ORDER — DEXMEDETOMIDINE HCL IN NACL 400 MCG/100ML IV SOLN: 0.0000 ug/kg/h | INTRAVENOUS | Status: DC

## 2019-09-01 MED ORDER — "THROMBI-PAD 3""X3"" EX PADS"
1.0000 | MEDICATED_PAD | Freq: Once | CUTANEOUS | Status: AC
  Administered 2019-09-01: 1 via TOPICAL
  Filled 2019-09-01: qty 1

## 2019-09-01 MED ORDER — FENTANYL CITRATE (PF) 100 MCG/2ML IJ SOLN
INTRAMUSCULAR | Status: AC
  Administered 2019-09-01: 100 ug
  Filled 2019-09-01: qty 2

## 2019-09-01 MED ORDER — MIDAZOLAM HCL 2 MG/2ML IJ SOLN
2.0000 mg | INTRAMUSCULAR | Status: AC | PRN
  Administered 2019-09-01 (×3): 2 mg via INTRAVENOUS
  Filled 2019-09-01 (×2): qty 2

## 2019-09-01 MED ORDER — VITAMIN K1 10 MG/ML IJ SOLN
10.0000 mg | Freq: Once | INTRAVENOUS | Status: AC
  Administered 2019-09-01: 13:00:00 10 mg via INTRAVENOUS
  Filled 2019-09-01: qty 1

## 2019-09-01 MED ORDER — SODIUM CHLORIDE 0.9 % IV BOLUS: 1000.0000 mL | Freq: Once | INTRAVENOUS | Status: DC

## 2019-09-01 MED ORDER — PHENYLEPHRINE HCL-NACL 10-0.9 MG/250ML-% IV SOLN
0.0000 ug/min | INTRAVENOUS | Status: DC
  Administered 2019-09-01: 18:00:00 60 ug/min via INTRAVENOUS
  Administered 2019-09-01: 20 ug/min via INTRAVENOUS
  Administered 2019-09-01: 120 ug/min via INTRAVENOUS
  Administered 2019-09-02: 03:00:00 15 ug/min via INTRAVENOUS
  Filled 2019-09-01 (×5): qty 250

## 2019-09-01 MED ORDER — VANCOMYCIN HCL 10 G IV SOLR
1500.0000 mg | Freq: Once | INTRAVENOUS | Status: AC
  Administered 2019-09-01: 13:00:00 1500 mg via INTRAVENOUS
  Filled 2019-09-01: qty 1500

## 2019-09-01 MED ORDER — MIDAZOLAM HCL 2 MG/2ML IJ SOLN
INTRAMUSCULAR | Status: AC
  Filled 2019-09-01: qty 2

## 2019-09-01 MED ORDER — FENTANYL BOLUS VIA INFUSION
25.0000 ug | INTRAVENOUS | Status: DC | PRN
  Filled 2019-09-01: qty 25

## 2019-09-01 MED ORDER — CHLORHEXIDINE GLUCONATE 0.12% ORAL RINSE (MEDLINE KIT)
15.0000 mL | Freq: Two times a day (BID) | OROMUCOSAL | Status: DC
  Administered 2019-09-01 (×3): 15 mL via OROMUCOSAL

## 2019-09-01 MED ORDER — MIDAZOLAM HCL 2 MG/2ML IJ SOLN
2.0000 mg | INTRAMUSCULAR | Status: DC | PRN
  Filled 2019-09-01: qty 2

## 2019-09-01 MED ORDER — INSULIN ASPART 100 UNIT/ML ~~LOC~~ SOLN
5.0000 [IU] | SUBCUTANEOUS | Status: DC
  Administered 2019-09-01 (×2): 5 [IU] via SUBCUTANEOUS

## 2019-09-01 MED ORDER — INSULIN ASPART 100 UNIT/ML ~~LOC~~ SOLN
0.0000 [IU] | SUBCUTANEOUS | Status: DC
  Administered 2019-09-01: 17:00:00 2 [IU] via SUBCUTANEOUS
  Administered 2019-09-01: 5 [IU] via SUBCUTANEOUS
  Administered 2019-09-01 (×2): 2 [IU] via SUBCUTANEOUS
  Administered 2019-09-01: 3 [IU] via SUBCUTANEOUS
  Administered 2019-09-02: 2 [IU] via SUBCUTANEOUS
  Administered 2019-09-02: 05:00:00 5 [IU] via SUBCUTANEOUS

## 2019-09-01 MED ORDER — CHLORHEXIDINE GLUCONATE 0.12 % MT SOLN
OROMUCOSAL | Status: AC
  Administered 2019-09-01: 15 mL via OROMUCOSAL
  Filled 2019-09-01: qty 15

## 2019-09-01 MED ORDER — VANCOMYCIN HCL IN DEXTROSE 750-5 MG/150ML-% IV SOLN
750.0000 mg | INTRAVENOUS | Status: DC
  Filled 2019-09-01: qty 150

## 2019-09-01 MED ORDER — SUCCINYLCHOLINE CHLORIDE 20 MG/ML IJ SOLN
INTRAMUSCULAR | Status: DC | PRN
  Administered 2019-09-01: 120 mg via INTRAVENOUS

## 2019-09-01 MED ORDER — PROPOFOL 1000 MG/100ML IV EMUL: 0.0000 ug/kg/min | INTRAVENOUS | Status: DC

## 2019-09-01 MED ORDER — NOREPINEPHRINE 4 MG/250ML-% IV SOLN
0.0000 ug/min | INTRAVENOUS | Status: DC
  Administered 2019-09-01: 25 ug/min via INTRAVENOUS
  Administered 2019-09-01: 10 ug/min via INTRAVENOUS
  Filled 2019-09-01: qty 250

## 2019-09-01 MED ORDER — VITAL HIGH PROTEIN PO LIQD
1000.0000 mL | ORAL | Status: DC
  Administered 2019-09-01: 13:00:00 1000 mL
  Administered 2019-09-02: 07:00:00

## 2019-09-01 MED ORDER — PRO-STAT SUGAR FREE PO LIQD
30.0000 mL | Freq: Two times a day (BID) | ORAL | Status: DC
  Administered 2019-09-01 (×2): 30 mL
  Filled 2019-09-01 (×2): qty 30

## 2019-09-01 MED ORDER — FENTANYL CITRATE (PF) 100 MCG/2ML IJ SOLN
25.0000 ug | Freq: Once | INTRAMUSCULAR | Status: AC
  Administered 2019-09-01: 03:00:00 50 ug via INTRAVENOUS

## 2019-09-01 NOTE — Progress Notes (Signed)
Progress Note  Patient Name: [name] Date of Encounter: 09/01/2019  Primary Cardiologist:   Rozann Lesches, MD   Subjective    Events of early this morning noted.    Patient had unresponsive episode with PEA reported.  Required short period of chest compressions, epi x 1 and intubation.  Intubated and sedated.   Inpatient Medications    Scheduled Meds: . aspirin  81 mg Oral Daily  . atorvastatin  20 mg Oral Daily  . chlorhexidine gluconate (MEDLINE KIT)  15 mL Mouth Rinse BID  . Chlorhexidine Gluconate Cloth  6 each Topical Daily  . hydrocortisone sod succinate (SOLU-CORTEF) inj  50 mg Intravenous Q6H  . insulin aspart  0-15 Units Subcutaneous Q4H  . insulin glargine  10 Units Subcutaneous BID  . iron polysaccharides  150 mg Oral Daily  . levothyroxine  75 mcg Oral Q0600  . mouth rinse  15 mL Mouth Rinse 10 times per day  . midazolam      . multivitamin with minerals  1 tablet Oral Daily  . pantoprazole (PROTONIX) IV  40 mg Intravenous Daily  . sodium chloride flush  10-40 mL Intracatheter Q12H  . tamsulosin  0.4 mg Oral Daily   Continuous Infusions: .  prismasol BGK 4/2.5 400 mL/hr at 08/31/19 1833  .  prismasol BGK 4/2.5 200 mL/hr at 08/31/19 1832  . sodium chloride 10 mL/hr at 09/01/19 0900  . ampicillin (OMNIPEN) IV Stopped (09/01/19 0759)  . angiotensin II (GIAPREZA) infusion 25 ng/kg/min (09/01/19 0900)  . cefTRIAXone (ROCEPHIN)  IV Stopped (08/31/19 2230)  . fentaNYL infusion INTRAVENOUS 350 mcg/hr (09/01/19 0900)  . norepinephrine (LEVOPHED) Adult infusion 24 mcg/min (09/01/19 0900)  . prismasol BGK 4/2.5 1,500 mL/hr (09/01/19 0538)   PRN Meds: alteplase, fentaNYL, guaiFENesin-dextromethorphan, heparin, ipratropium-albuterol, midazolam, nitroGLYCERIN, sodium chloride, sodium chloride flush   Vital Signs    Vitals:   09/01/19 0700 09/01/19 0737 09/01/19 0800 09/01/19 0900  BP: (!) 118/54  (!) 109/50 (!) 108/45  Pulse: 68  67 63  Resp: 12  19 20    Temp:  (!) 96.2 F (35.7 C) 97.7 F (36.5 C)   TempSrc:  Axillary Oral   SpO2: 97%  96% 92%  Weight: 77.2 kg     Height:        Intake/Output Summary (Last 24 hours) at 09/01/2019 0911 Last data filed at 09/01/2019 0900 Gross per 24 hour  Intake 1757.84 ml  Output 2405 ml  Net -647.16 ml   Filed Weights   08/30/19 0601 08/31/19 0509 09/01/19 0700  Weight: 77.6 kg 79.4 kg 77.2 kg    Telemetry    NSR, with short runs of NSVT  - Personally Reviewed  ECG    Atrial fib, RBBB, borderline QT prolongation.  No acute ST T wave changes.  - Personally Reviewed  Physical Exam   GEN: Intubated and sedated Neck: No  JVD Cardiac: Irregular RR, 3/6 systolic murmur heard over the entire precordium, no murmurs, rubs, or gallops.  Respiratory: Clear   to auscultation bilaterally. GI: Soft, nontender, non-distended, normal bowel sounds  MS:  No edema; No deformity. Neuro:   Nonfocal  Psych: Oriented and appropriate    Labs    Chemistry Recent Labs  Lab 08/30/19 0405  08/31/19 0516 08/31/19 1238 09/01/19 0359 09/01/19 0410  NA 131*   < > 131* 134* 136 134*  K 5.0   < > 6.0* 5.1 5.2* 4.7  CL 96*   < > 94* 95* 98  --  CO2 25   < > 23 21* 18*  --   GLUCOSE 383*   < > 307* 219* 245*  --   BUN 53*   < > 88* 97* 77*  --   CREATININE 2.31*   < > 4.22* 4.63* 4.20*  --   CALCIUM 8.7*   < > 9.0 9.0 7.9*  --   PROT 6.5  --  6.5  --  6.2*  --   ALBUMIN 2.6*  --  2.7*  --  2.5*  --   AST 30  --  45*  --  127*  --   ALT 35  --  44  --  97*  --   ALKPHOS 98  --  105  --  145*  --   BILITOT 1.0  --  0.9  --  1.2  --   GFRNONAA 28*   < > 13* 12* 13*  --   GFRAA 32*   < > 16* 14* 16*  --   ANIONGAP 10   < > 14 18* 20*  --    < > = values in this interval not displayed.     Hematology Recent Labs  Lab 08/30/19 0405 08/31/19 0516 09/01/19 0359 09/01/19 0410  WBC 17.1* 38.9* 52.5*  --   RBC 3.22* 2.88* 2.49*  --   HGB 9.8* 9.1* 7.7* 11.2*  HCT 29.5* 26.5* 24.0* 33.0*  MCV 91.6  92.0 96.4  --   MCH 30.4 31.6 30.9  --   MCHC 33.2 34.3 32.1  --   RDW 13.3 13.7 14.2  --   PLT 415* 471* 581*  --     Cardiac EnzymesNo results for input(s): TROPONINI in the last 168 hours. No results for input(s): TROPIPOC in the last 168 hours.   BNP Recent Labs  Lab 09/03/2019 1846 08/29/19 0811  BNP 389.1* 546.0*     DDimer  Recent Labs  Lab 08/29/19 1601  DDIMER 4.27*     Radiology    US Renal  Result Date: 08/30/2019 CLINICAL DATA:  Acute kidney injury. EXAM: RENAL / URINARY TRACT ULTRASOUND COMPLETE COMPARISON:  Ultrasound 06/05/2019.  Abdominal CT 08/07/2019 FINDINGS: Right Kidney: Renal measurements: 10.2 x 5.8 x 6.5 cm = volume: 201 mL. Simple cyst in the upper kidney measuring 3.8 cm, unchanged. Echogenicity within normal limits. No solid mass or hydronephrosis visualized. Left Kidney: Renal measurements: 9.6 x 4.9 x 5.5 cm = volume: 136 mL. Echogenicity within normal limits. No mass or hydronephrosis visualized. Bladder: Appears normal for degree of bladder distention. IMPRESSION: Unremarkable renal ultrasound.  Unchanged right renal cyst. Electronically Signed   By: Keith Rake M.D.   On: 08/30/2019 21:58   Dg Chest Port 1 View  Result Date: 09/01/2019 CLINICAL DATA:  Post code. EXAM: PORTABLE CHEST 1 VIEW COMPARISON:  Radiograph yesterday FINDINGS: Endotracheal tube tip at the thoracic inlet. Right central venous catheter tip projects over the proximal SVC. No visualized pneumothorax. Post median sternotomy with unchanged cardiomegaly. Diffuse bilateral pulmonary opacities which may be infection or pulmonary edema. Suspected bilateral pleural effusions. No rib fracture visualized by radiograph. IMPRESSION: 1. Endotracheal tube tip at the thoracic inlet. 2. Diffuse bilateral pulmonary opacities which may be infection, pulmonary edema, or combination thereof. Findings are not significantly changed since yesterday. 3. Suspected bilateral pleural effusions.  Electronically Signed   By: Keith Rake M.D.   On: 09/01/2019 02:51   Dg Chest Port 1 View  Result Date: 08/31/2019 CLINICAL DATA:  Central  line placement EXAM: PORTABLE CHEST 1 VIEW COMPARISON:  August 31, 2019 FINDINGS: New right central line is been placed with distal tip central SVC. No pneumothorax. Diffuse bilateral interstitial opacities remain, similar in the upper lobes. Opacity is more focal in the lateral right lung base left lung base, worsened in the interval. No other interval changes. IMPRESSION: 1. The new right central line is in good position with no pneumothorax. 2. Bilateral focal pulmonary infiltrates have worsened in the interval. Given the superimposed diffuse interstitial opacities, findings could represent pulmonary edema. However, the findings are concerning for bibasilar multifocal pneumonia. Recommend correlation. Electronically Signed   By: Dorise Bullion III M.D   On: 08/31/2019 13:05   Dg Chest Port 1 View  Result Date: 08/31/2019 CLINICAL DATA:  Shortness of breath, tachypnea. EXAM: PORTABLE CHEST 1 VIEW COMPARISON:  Radiograph of August 28, 2019. FINDINGS: Stable cardiomediastinal silhouette. Status post coronary bypass graft. No pneumothorax is noted. Mildly increased bilateral diffuse lung opacities are noted concerning for worsening edema or multifocal pneumonia. Small bilateral pleural effusions are noted. Bony thorax is unremarkable. IMPRESSION: Increased bilateral lung opacities are noted concerning for worsening edema or multifocal pneumonia. Small pleural effusions are noted. Electronically Signed   By: Marijo Conception M.D.   On: 08/31/2019 09:13    Cardiac Studies   ECHO   1. The tricuspid valve is normal in structure. Tricuspid valve regurgitation is trivial. There is a 1.7 cm mobile mass attached to the tricuspid valve and prolapsing back and forth across the valve, concern for endocarditis. Recommend TEE to assess  tricuspid valve.  2. Left  ventricular ejection fraction, by visual estimation, is 50 to 55%. The left ventricle has mildly decreased function. Normal left ventricular size. There is no left ventricular hypertrophy. Basal inferior and inferolateral akinesis.  3. Left ventricular diastolic Doppler parameters are indeterminate pattern of LV diastolic filling.  4. Definity contrast agent was given IV to delineate the left ventricular endocardial borders.  5. Mild mitral annular calcification.  6. The mitral valve is normal in structure. Mild to moderate mitral valve regurgitation. No evidence of mitral stenosis.  7. Global right ventricle has mildly reduced systolic function.The right ventricular size is normal. No increase in right ventricular wall thickness.  8. Left atrial size was moderately dilated.  9. Mechanical aortic valve replacement. Mild peri-valvular regurgitation. Mean gradient elevated at 24 mmHg suggesting at least mild patient-prosthesis mismatch. 10. Right atrial size was normal. 11. The inferior vena cava is dilated in size with >50% respiratory variability, suggesting right atrial pressure of 8 mmHg. 12. TR signal is inadequate for assessing pulmonary artery systolic pressure.  Patient Profile     68 y.o. 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 I talked with over with CCM.  They would like a limited repeat TTE to evaluate any progression in TR as murmur is more pronounced.  On antibiotics.    ELEVATED HS TROP:   No suggestion of acute coronary event.  Might be secondary trop elevation.   Continue supportive care.   ACUTE RESPIRATORY FAILURE:    Status post PEA arrest  yesterday now intubated.  Event thought to be  precipitated by worsening respiratory condition on BiPAP.  Unable to diurese yesterday with meds and had just started CRRT.  Keeping I/O even for now.    PAF:  INR is elevated.  Holding off on reversing warfarin unless INR continues to climb.    MECHANICAL AVR:  INR has been supratherapeutic.    AKI:   Renal consulted and  CRRT ongoing.      For questions or updates, please contact [place] Please consult [email or web address] for contact info under Cardiology/STEMI.   Signed, Minus Breeding, MD  09/01/2019, 9:11 AM

## 2019-09-01 NOTE — Progress Notes (Signed)
Staff RN noted Pt's BIPAP alarming apnea, Pt found to unresponsive and in PEA arrest. Code blue initiated, refer to code blue documentation. ROSC obtained after approx 10-15 mins of compressions.

## 2019-09-01 NOTE — Progress Notes (Signed)
Assisted tele visit to patient with son.  Zymir Napoli P, RN   

## 2019-09-01 NOTE — Progress Notes (Signed)
  Echocardiogram 2D Echocardiogram has been performed.  [name] 09/01/2019, 1:26 PM

## 2019-09-01 NOTE — Progress Notes (Signed)
Facilitated elink video call for patient family.

## 2019-09-01 NOTE — Progress Notes (Signed)
eLink Physician-Brief Progress Note Patient Name: [name] DOB: 1951/10/01 MRN: [id number]   Date of Service  09/01/2019  HPI/Events of Note  Video: Patient was just coded for losing pulse with preserved BP and lost consciousness. With round of CPR/epi with ROSC. Intubated 550/5/18/100%.   Admitted on 23 rd for CHF exacerabtion, CABG hx, AVR 2013 on Warf AC, AKI getting CRRT, neg 1.5 lit, sTV endocarditis, enterococcal bacteremia/septic emboli?.  Data: Reviewed. K was 5.1, Cr 4.6, Hg 9.1, wbc 38K. ECHO EF 55%. TV 1.7 mobile mass. TEE pending for Monday as per cards note.  CT chest plain: IMPRESSION: 1. Multifocal bilateral ground-glass opacities throughout both lungs. In the setting of pleural effusions and septal thickening, findings favor pulmonary edema or atypical infection. 2. Small bilateral pleural effusions and fluid in the fissures.    eICU Interventions  - ordered Vent protocol, CxR, ABG post. Get CMP. - Levophed gtt for MAP > 65/Propofol for RASS -1 ordered.  - INR elevated. - BG goal < 180 - Bed side  Dr Zacarias Pontes and NP: notified via Secure chat. - continue cardiac care/abx. - continue CRRT. - follow labs in AM.      Intervention Category Major Interventions: Code management / supervision;Respiratory failure - evaluation and management  Elmer Sow 09/01/2019, 2:26 AM

## 2019-09-01 NOTE — Progress Notes (Signed)
CRITICAL VALUE ALERT  Critical Value:  INR >10  Date & Time Notied:  09/01/19 11:58  Provider Notified: Erskine Emery, MD  Orders Received/Actions taken: Repeat labs. See new orders.   Lucius Conn, RN

## 2019-09-01 NOTE — Progress Notes (Signed)
eLink Physician-Brief Progress Note Patient Name: ELCHONON MAXSON DOB: December 13, 1950 MRN: [id number]   Date of Service  09/01/2019  HPI/Events of Note  1. WBC > 50 K and troponin > 1500 post PEA arrest/CPR   Video: On CRRT.  On Levo at 20 and Giapareza at 23. On Hydrocortisone also.   eICU Interventions  - mostly demand ischemia and CPR related elevated troponin and from suspected endocarditis. - ID to follow up in am. Already on 2 beta lactam.      Intervention Category Intermediate Interventions: Diagnostic test evaluation  Elmer Sow 09/01/2019, 6:08 AM

## 2019-09-01 NOTE — Progress Notes (Signed)
eLink Physician-Brief Progress Note Patient Name: [name] DOB: February 01, 1951 MRN: [id number]   Date of Service  09/01/2019  HPI/Events of Note  Notified of glucose trending down. Most recent 124. Patient is scheduled to receive 5 units regular insulin, then additional 2 units as part of SSI and 10 units lantus. Patient on CRRT but receiving hydrocotisone.  eICU Interventions  May give the Lantus and 2 units SSI Hold the scheduled 5 units regular insulin for now and reasses     Intervention Category Intermediate Interventions: Hyperglycemia - evaluation and treatment  Shona Needles Laurieann Friddle 09/01/2019, 8:44 PM

## 2019-09-01 NOTE — Progress Notes (Signed)
   Results of the progress note discussed with the patient's family.  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  Surgery would be prohibitively high risk and he would likely not survive this given the multi organ failure and shock currently.  Plan for continued supportive care at this juncture.

## 2019-09-01 NOTE — Progress Notes (Signed)
PCCM Interval Note  Called to bedside by Elink s/p cardiac arrest.   RN reports patient had been doing well overnight, but remained very tachypneic despite BiPAP, CRRT running well.   Bipap was found alarming apnea and patient noted to be unresponsive in PEA.    CPR performed with 1 epi with ROSC after 5 minutes.  Intubated by anesthesia.   CRRT stopped, blood not able to be returned.    Requiring levophed post arrest for blood pressure.  Has trialysis catheter in R IJ  Blood pressure (!) 110/47, pulse 70, temperature 97.6 F (36.4 C), temperature source Axillary, resp. rate 18, height 5\' 8"  (1.727 m), weight 79.4 kg, SpO2 100 %.  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 Acute hypoxic respiratory failure with pulmonary septic emboli and hypervolemia AKI on CRRT Hx of AVR on coumadin, with supratherapeutic  DMT2 with hyperglycemia  P:  Full MV support PAD protocol with fentanyl gtt and prn versed for RASS goal 0/-1 Defer TTM as patient has purposeful movement Add PPI ABG shows respiratory acidosis  CXR- appears better, good positioning of ETT Insert Aline Will likely need additional CVL  Continue levophed for MAP goal >65 Add stress dose steroids Continue abx per ID Sending CBC, CMP, Mag, Phos, hs-trop x 3, EKG, coags Change CBG to q4 w/ SSI q 4, ongoing lantus  Restarting CRRT   Patients wife, Vaughan Basta, called and updated about event  I spent 30 minutes in direct patient care including reviewing data,   discussing with other providers, assessment, planning and stabilization and documentation. Time is exclusive to this patient and does not include procedures.    Kennieth Rad, MSN, AGACNP-BC [facility] Pgr: [phone] or if no answer [phone] 09/01/2019, 2:55 AM

## 2019-09-01 NOTE — Progress Notes (Signed)
[facility] Progress Note  Subjective: short PEA arrest 5 min last night, now intubated. CXR looks a little better today.  Unable to UF much due to shock , on pressors x 2.    Vitals:   09/01/19 1030 09/01/19 1100 09/01/19 1115 09/01/19 1130  BP:      Pulse: 66 65 64 63  Resp: 18 (!) 24 18 17   Temp:  98.4 F (36.9 C)    TempSrc:  Axillary    SpO2: 100% 99% 99% 97%  Weight:      Height:        Inpatient medications: . aspirin  81 mg Oral Daily  . atorvastatin  20 mg Oral Daily  . chlorhexidine gluconate (MEDLINE KIT)  15 mL Mouth Rinse BID  . Chlorhexidine Gluconate Cloth  6 each Topical Daily  . feeding supplement (PRO-STAT SUGAR FREE 64)  30 mL Per Tube BID  . feeding supplement (VITAL HIGH PROTEIN)  1,000 mL Per Tube Q24H  . hydrocortisone sod succinate (SOLU-CORTEF) inj  50 mg Intravenous Q6H  . insulin aspart  0-15 Units Subcutaneous Q4H  . insulin aspart  5 Units Subcutaneous Q4H  . insulin glargine  10 Units Subcutaneous BID  . iron polysaccharides  150 mg Oral Daily  . levothyroxine  75 mcg Oral Q0600  . mouth rinse  15 mL Mouth Rinse 10 times per day  . midazolam      . multivitamin with minerals  1 tablet Oral Daily  . pantoprazole (PROTONIX) IV  40 mg Intravenous Daily  . sodium chloride flush  10-40 mL Intracatheter Q12H  . tamsulosin  0.4 mg Oral Daily  . Thrombi-Pad  1 each Topical Once   .  prismasol BGK 4/2.5 400 mL/hr at 08/31/19 1833  .  prismasol BGK 4/2.5 200 mL/hr at 08/31/19 1832  . sodium chloride Stopped (09/01/19 1055)  . ampicillin (OMNIPEN) IV Stopped (09/01/19 0759)  . cefTRIAXone (ROCEPHIN)  IV 200 mL/hr at 09/01/19 1100  . fentaNYL infusion INTRAVENOUS 200 mcg/hr (09/01/19 1238)  . norepinephrine (LEVOPHED) Adult infusion 24 mcg/min (09/01/19 1100)  . phytonadione (VITAMIN K) IV    . prismasol BGK 4/2.5 1,500 mL/hr at 09/01/19 1233  . vancomycin 1,500 mg (09/01/19 1234)   Followed by  . [START ON Sep 04, 2019] vancomycin    .  vasopressin (PITRESSIN) infusion - *FOR SHOCK* 0.03 Units/min (09/01/19 1104)   alteplase, fentaNYL, guaiFENesin-dextromethorphan, heparin, ipratropium-albuterol, midazolam, nitroGLYCERIN, sodium chloride, sodium chloride flush    Exam: Gen alert, on bipap, not able to speak much, mild ^wob No rash, cyanosis or gangrene Sclera anicteric, throat not seen  No jvd or bruits Chest diffuse bilat rhonchi/ rales RRR no MRG Abd soft ntnd no mass or ascites +bs GU normal male MS no joint effusions or deformity Ext diffuse 2-3+ LE edema, no wounds or ulcers Neuro is alert, Ox 3 , nf    Home meds:  - amlodipine 5 qd/ furosemide 40 qd  - sl ntg prn/ atorvastatin 20/ aspirin 81   - allopurinol 300/ levothyroxine 75 ug/ tamsulosin 0.4  - insulin glargine 10 qd/ linagliptin 5 qd/ glipizide 10 bid  - prn's/ vitamins/ supplements                No UA done   Renal US normal kidneys no hydro    Assessment/ Plan: 1. Renal failure - acute and possibly subacute, in setting of TV endocarditis and enterococcal bacteremia. UOP dropping off and creatinine rising daily.  Needs  UA, urine lytes.  Renal US wnl.  Started CRRT yest 9/26. Not able to pull a lot of fluid due to hypotension. Now on levo gtt 24 ug/min and also angio II gtt. CVP 8- 12. INR > 10, no need for anticoag w/ crrt.  Max UF early on.  2. Vol overload - sig LE edema, however CVP normal 3. Shock due to sepsis - WBC up 50K on pressors x 2 4. Acute resp failure - on vent, diffuse infiltrates , poss septic emboli/ PNA from #5 5. 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 Hx mech AVR 7. DM2 8. CAD sp CABG 9. Atrial fib / hx MAZE    Rob Tori Cupps 09/01/2019, 12:50 PM  Iron/TIBC/Ferritin/ %Sat No results found for: IRON, TIBC, FERRITIN, IRONPCTSAT Recent Labs  Lab 09/01/19 0359  09/01/19 1030 09/01/19 1121  NA 136   < >  --  137  K 5.2*   < >  --  5.2*  CL 98  --   --   --   CO2 18*  --   --   --    GLUCOSE 245*  --   --   --   BUN 77*  --   --   --   CREATININE 4.20*  --   --   --   CALCIUM 7.9*  --   --   --   PHOS 6.4*  --  5.9*  --   ALBUMIN 2.5*  --   --   --   INR  --   --  >10.0*  --    < > = values in this interval not displayed.   Recent Labs  Lab 09/01/19 0359  AST 127*  ALT 97*  ALKPHOS 145*  BILITOT 1.2  PROT 6.2*   Recent Labs  Lab 09/01/19 0359  09/01/19 1121  WBC 52.5*  --   --   HGB 7.7*   < > 7.8*  HCT 24.0*   < > 23.0*  PLT 581*  --   --    < > = values in this interval not displayed.

## 2019-09-01 NOTE — Progress Notes (Signed)
Spoke w/ patient's wife in the context of prior goals of care discussion by both myself AND Dr Kennon Rounds on initial CCM consult.  Based on his clinical decline and critical illness we have made him full DNR.   Plan Continue current pressors Mechanical ventilation  CRRT  Have called family to see pt at bedside.  DNR should he arrest.  Given his course thus far he is unlikely to survive even w/ all current interventions.   Erick Colace ACNP-BC [place] Pager # [phone] OR # [phone] if no answer

## 2019-09-01 NOTE — Progress Notes (Addendum)
NAME:  [name], MRN:  [id number], DOB:  September 11, 1951, LOS: 4 ADMISSION DATE:  08/06/2019, CONSULTATION DATE:  08/29/19  REFERRING MD:  Flossie Dibble, CHIEF COMPLAINT:  hypoxia  Brief History   68 yo admitted with hypoxic resp failure admitted w/ TV endocarditis   Past Medical History   Past Medical History:  Diagnosis Date  . Aortic stenosis   . Atrial fibrillation (HCC)    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 February 2013  . CHF (congestive heart failure) (HCC)   . Coronary atherosclerosis of native coronary artery    2 vessel s/p CABG initial surgery in 2004, status post redo coronary bypass grafting x 3 2013  . Essential hypertension   . H/O hiatal hernia   . Hypothyroidism   . Mixed hyperlipidemia   . Myocardial infarction (HCC) 1998  . Osteoarthritis   . Pneumonia 2010  . Rotator cuff tear    Right shoulder  . Sleep apnea    wears CPAP nightly  . Type 2 diabetes mellitus (HCC)     Significant Hospital Events   9/26 Worse, now more hypoxic and requiring more oxygen. Moved to ICU for worse renal fxn and biventricular HF  9/27: Cardiac arrest, seemingly respiratory mediated.  PEA.  5 minutes to ROSC.  Intubated.  Post CPR for shock requiring vasoactive drips.  Initially placed on norepinephrine, giapreza and stress dose steroids Consults:  Cardiology 9/23 Nephrology 9/26 Procedures:  Right IJ HD catheter 9/26 Right fem triple-lumen catheter 9/27 Right femoral arterial line placed 9/27 Endotracheal tube 9/27  Significant Diagnostic Tests:  9/24 echo: LVEF 50-55%, rv with some reduced ef.  9/27 echo   Micro Data:  9/23 blood: enterococcus  9/23 sars2: neg 9/27 BC>>>  Antimicrobials:  Cefepime 9/24-> 9/24 Rocephin 9/25 Ampicillin 9/24 Vancomycin 9/27>>>  Interim history/subjective:  Now sedated and ventilated  Objective   Blood pressure (Abnormal) 109/50, pulse 67, temperature 97.7 F (36.5 C), temperature source Oral, resp. rate 19,  height 5\' 8"  (1.727 m), weight 77.2 kg, SpO2 96 %. CVP:  [0 mmHg-12 mmHg] 9 mmHg  Vent Mode: PRVC FiO2 (%):  [50 %-100 %] 80 % Set Rate:  [12 bmp-18 bmp] 12 bmp Vt Set:  [550 mL] 550 mL PEEP:  [5 cmH20] 5 cmH20 Plateau Pressure:  [26 cmH20] 26 cmH20   Intake/Output Summary (Last 24 hours) at 09/01/2019 09/03/2019 Last data filed at 09/01/2019 0800 Gross per 24 hour  Intake 1610.42 ml  Output 2405 ml  Net -794.58 ml   Filed Weights   08/30/19 0601 08/31/19 0509 09/01/19 0700  Weight: 77.6 kg 79.4 kg 77.2 kg    Examination: General 68 year old wm sedated on vent  HENT NCAT orally intuabted pulm diffuse coarse rhonchi w/ tactile frem Card tricuspid murmur Regular rhythm  abd soft not tender + bowel sounds gu clear yellow  Neuro sedated    Resolved Hospital Problem list   Hyponatremia  Assessment & Plan:  Acute hypoxic resp failure in setting of bacteremia/presumtive endocarditis and new bilateral patchy pulmonary infiltrates (edema vs evolving ALI or PNA) History of sleep apnea -Intubated early this morning -Portable chest x-ray personally reviewed this demonstrates diffuse bilateral pulmonary infiltrates.  Aeration appears a little improved with positive pressure endotracheal tube as well as right HD catheter in satisfactory position  -Arterial blood gas reviewed Plan Continue full ventilatory support VAP bundle We will check respiratory culture PAD protocol RASS goal -1 Repeat a.m. chest x-ray  Severe Sepsis in setting  of enterococcus bacteremia/TV endocarditis -has been hemodynamically stable w/out any hypotension -ID now following; white blood cell count continues to climb -TEE held due to INR and hypoxia; I am not sure how much help a TEE would be at this point given we have identified clot on TTE Plan Wait follow-up cultures Day #4 ampicillin, #3 Rocephin Adding IV vancomycin  Circulatory shock status post cardiac arrest 9/27 with mild troponin elevation post CPR  -Currently on Giapreza and norepinephrine.  -CVP currently: Plan Repeat bedside echocardiogram, given his degree of shock I do not think he is a candidate for TEE currently Add vasopressin, continue norepinephrine, discontinue giapreza Ensure pH greater than 7.2 Continue stress dose steroids Continue telemetry monitoring  Mechanical AVR 7 years ago. (on chronic AC) Plan Continuing aspirin Will check INR, will eventually need to go on IV heparin  Chronic AF Plan Continue rate control IV anticoagulation once INR less than 2.5  Coumadin related coagulopathy Plan Repeating INR this morning Holding warfarin  Worsening AKI (looks like has CKD at baseline) with slightly improved hyperkalemia after initiation of CRRT Mild anion gap metabolic acidosis status post CPR I&O balance is negative Renal ultrasound was negative CRRT initiated 9/26  plan Continue CRRT per nephrology  DM2 with hyperglycemia Glycemic control is poor.   Plan Sliding scale insulin with basal coverage  Normocytic anemia Hemoglobin is been stable for 3 days now Plan Transfuse for hemoglobin less than 7 Serial CBCs  Hypothyroidism Plan Cont synthroid  Goals of care:  Now dnr if arrests  Best practice Diet: NPO-->tubefeeds 9/27  DVT: Warfarin (on hold->transitioning to Heparin) Vent: VAP bundle 9/27 Activity: bedrest SUP: NA; 9/27 Sedation protocol 9/27 Glycemic control 9/27  My cct 49 minutes  Erick Colace ACNP-BC [place] Pager # [phone] OR # [phone] if no answer\

## 2019-09-01 NOTE — Progress Notes (Signed)
ANTICOAGULATION CONSULT NOTE - Follow Up Consult  Pharmacy Consult for Coumadin>heparin Indication:  afib, mechanical AVR  No Known Allergies  Patient Measurements: Height: 5\' 8"  (172.7 cm) Weight: 170 lb 3.1 oz (77.2 kg) IBW/kg (Calculated) : 68.4  Vital Signs: Temp: 97.7 F (36.5 C) (09/27 0800) Temp Source: Oral (09/27 0800) BP: 115/55 (09/27 1000) Pulse Rate: 66 (09/27 1030)  Labs: Recent Labs    08/30/19 0405  08/31/19 0516 08/31/19 1238 09/01/19 0359 09/01/19 0410 09/01/19 0750  HGB 9.8*  --  9.1*  --  7.7* 11.2*  --   HCT 29.5*  --  26.5*  --  24.0* 33.0*  --   PLT 415*  --  471*  --  581*  --   --   LABPROT 44.9*  --  49.1*  --   --   --   --   INR 4.9*  --  5.5*  --   --   --   --   CREATININE 2.31*   < > 4.22* 4.63* 4.20*  --   --   TROPONINIHS  --   --   --   --  1,578*  --  1,632*   < > = values in this interval not displayed.    Estimated Creatinine Clearance: 16.1 mL/min (A) (by C-G formula based on SCr of 4.2 mg/dL (H)).  Assessment:  68 yo M on warfarin pta for afib and mechanical AVR. (PTA wafarin 5mg  Tues/Fri, 2.5mg  other days), INR 5 on admit.  INR today is >10 - will discuss with MD about reversal.  Patient is now s/p PEA arrest and TEE is on hold.  Pharmacy consulted to initiate heparin when INR < 3.  Goal of Therapy:  INR 2.5-3.5 Monitor platelets by anticoagulation protocol: Yes   Plan:  -Hold warfarin today.  -Initiate heparin IV once INR < 3 -Daily INR, CBC -Monitor for bleeding  Vertis Kelch, PharmD PGY2 Cardiology Pharmacy Resident Phone [phone] 09/01/2019       10:59 AM  Please check [email or web address] for unit-specific pharmacist phone numbers

## 2019-09-01 NOTE — Progress Notes (Signed)
Pharmacy Antibiotic Note  [name] is a 68 y.o. 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 Patient has been receiving ampicillin and ceftriaxone per MD. Acute decompensation overnight causing PEA arrest likely due to respiratory distress. Patient was also started on CRRT on 9/26 for acute renal failure. Pharmacy has been consulted for vancomycin dosing given abrupt decompensation.  Plan: Conitnue ampicillin 2 g IV q8h Conitnue ceftriaxone 2 g IV q12h Vancomycin 1500 mg IV x 1, then 750 mg IV q24h Monitor cultures, CRRT duration, length of therapy, and vancomycin levels as indicated.  Height: 5\' 8"  (172.7 cm) Weight: 170 lb 3.1 oz (77.2 kg) IBW/kg (Calculated) : 68.4  Temp (24hrs), Avg:97.3 F (36.3 C), Min:96.2 F (35.7 C), Max:97.7 F (36.5 C)  Recent Labs  Lab 2019-09-05 2113 09-05-19 2334 08/29/19 0300 08/29/19 0811 08/30/19 0405 08/30/19 1001 08/30/19 1742 08/31/19 0516 08/31/19 1238 09/01/19 0359  WBC  --   --  20.6* 20.1* 17.1*  --   --  38.9*  --  52.5*  CREATININE  --   --  1.82*  --  2.31* 2.61* 3.25* 4.22* 4.63* 4.20*  LATICACIDVEN 1.3 1.4  --   --   --   --   --   --   --   --     Estimated Creatinine Clearance: 16.1 mL/min (A) (by C-G formula based on SCr of 4.2 mg/dL (H)).    No Known Allergies  Antimicrobials this admission: Ampicillin 9/24 >> CTX 9/25 >> Vanc 9/27 >>  Dose adjustments this admission: None  Microbiology results: 9/23 Bcx and BCID - enterococcus faecalis - pan sensitive  9/24 Bcx - ngtd 9/25 UCx - ngF  Thank you for allowing pharmacy to be a part of this patient's care.  Vertis Kelch, PharmD PGY2 Cardiology Pharmacy Resident Phone [phone] 09/01/2019       11:05 AM  Please check [email or web address] for unit-specific pharmacist phone numbers

## 2019-09-01 NOTE — Progress Notes (Signed)
CRITICAL VALUE ALERT  Critical Value:  WBC 52.5; Trop I 1578  Date & Time Notied:  0530  Provider Notified: Dr Prudencio Burly CCM E-link  Orders Received/Actions taken: orders received to repeat Trop @ 7a

## 2019-09-01 NOTE — Anesthesia Procedure Notes (Signed)
Procedure Name: Intubation Date/Time: 09/01/2019 1:55 AM Performed by: Suzy Bouchard, CRNA Pre-anesthesia Checklist: Patient identified, Emergency Drugs available, Suction available, Patient being monitored and Timeout performed Patient Re-evaluated:Patient Re-evaluated prior to induction Oxygen Delivery Method: Ambu bag Preoxygenation: Pre-oxygenation with 100% oxygen Induction Type: IV induction Ventilation: Mask ventilation without difficulty Laryngoscope Size: Glidescope and 4 Grade View: Grade I Tube type: Oral Tube size: 7.5 mm Number of attempts: 1 Airway Equipment and Method: Stylet and Video-laryngoscopy Placement Confirmation: ETT inserted through vocal cords under direct vision,  CO2 detector,  breath sounds checked- equal and bilateral and positive ETCO2 Secured at: 23 cm Tube secured with: Tape Dental Injury: Teeth and Oropharynx as per pre-operative assessment  Comments: t

## 2019-09-01 NOTE — Progress Notes (Signed)
RT attempted A-line twice. Another RT attempted A-line without any success. MD aware and is going to place femoral A-line.

## 2019-09-01 NOTE — Progress Notes (Signed)
Placed call to chaplain for family support.

## 2019-09-01 NOTE — Procedures (Signed)
Central Venous Catheter Insertion Procedure Note SHADRACH BARTUNEK [id number] [birth year]  Procedure: Insertion of Central Venous Catheter Indications: Assessment of intravascular volume, Drug and/or fluid administration and Frequent blood sampling  Procedure Details Consent: Risks of procedure as well as the alternatives and risks of each were explained to the (patient/caregiver).  Consent for procedure obtained. by phone from wife Time Out: Verified patient identification, verified procedure, site/side was marked, verified correct patient position, special equipment/implants available, medications/allergies/relevent history reviewed, required imaging and test results available.  Performed  Maximum sterile technique was used including antiseptics, cap, gloves, gown, hand hygiene, mask and sheet. Skin prep: Chlorhexidine; local anesthetic administered A antimicrobial bonded/coated triple lumen catheter was placed in the right femoral vein due to small left IJ with visual ultrasound, right IJ with HD catheter,  using the Seldinger technique.  Evaluation Blood flow good Complications: No apparent complications Patient did tolerate procedure well. Chest X-ray ordered to verify placement.  CXR: n/a.  Procedure performed with ultrasound guidance for real time vessel cannulation.      Kennieth Rad, MSN, AGACNP-BC [facility] Pgr: [phone] or if no answer [phone] 09/01/2019, 4:01 AM

## 2019-09-01 NOTE — Progress Notes (Signed)
While attempting to place central line and aline, Pt woke from anesthesia and immediately began reaching for medical tubes and lines. Reassurence and re-orientation provided without decrease in attempts. Per verbal order bil. Soft wrist applied per protocol. Will continue to monitor and assess for ability to remove.

## 2019-09-01 NOTE — Consult Note (Signed)
Responded to page, conferred with nurse re: pt's status,  visited and prayed with and provided spiritual/ emotional support to 4 family members in room with pt: wife, mother, sister, and niece. Family is [place].Gave wife a prayer shawl, then went back & got another for pt's 91-y-o mother. They were appreciated. Staff will page again if further need of chaplain services.   Rev. Eloise Levels Chaplain

## 2019-09-01 NOTE — Procedures (Signed)
Arterial Catheter Insertion Procedure Note [name] [id number] 1951/05/21  Procedure: Insertion of Arterial Catheter  Indications: Blood pressure monitoring and Frequent blood sampling  Radial aline unable to be placed by several attempts on both arms by RT.  Procedure Details Consent: Risks of procedure as well as the alternatives and risks of each were explained to the (patient/caregiver).  Consent for procedure obtained. Time Out: Verified patient identification, verified procedure, site/side was marked, verified correct patient position, special equipment/implants available, medications/allergies/relevent history reviewed, required imaging and test results available.  Performed  Maximum sterile technique was used including antiseptics, cap, gloves, gown, hand hygiene, mask and sheet. Skin prep: Chlorhexidine; local anesthetic administered 20 gauge catheter was inserted into right femoral artery using the Seldinger technique. ULTRASOUND GUIDANCE USED: YES Evaluation Blood flow good; BP tracing good. Complications: No apparent complications.   Kennieth Rad, MSN, AGACNP-BC [facility] Pgr: [phone] or if no answer [phone] 09/01/2019, 4:02 AM

## 2019-09-02 ENCOUNTER — Inpatient Hospital Stay (HOSPITAL_COMMUNITY): Payer: BC Managed Care – PPO

## 2019-09-02 ENCOUNTER — Encounter (HOSPITAL_COMMUNITY): Payer: Self-pay | Admitting: Certified Registered"

## 2019-09-02 ENCOUNTER — Encounter (HOSPITAL_COMMUNITY): Admission: EM | Disposition: E | Payer: Self-pay | Source: Home / Self Care | Attending: Internal Medicine

## 2019-09-02 ENCOUNTER — Other Ambulatory Visit (HOSPITAL_COMMUNITY): Payer: BC Managed Care – PPO

## 2019-09-02 DIAGNOSIS — A419 Sepsis, unspecified organism: Secondary | ICD-10-CM

## 2019-09-02 DIAGNOSIS — I351 Nonrheumatic aortic (valve) insufficiency: Secondary | ICD-10-CM

## 2019-09-02 LAB — CBC
HCT: 22.3 % — ABNORMAL LOW (ref 39.0–52.0)
Hemoglobin: 7.4 g/dL — ABNORMAL LOW (ref 13.0–17.0)
MCH: 31.8 pg (ref 26.0–34.0)
MCHC: 33.2 g/dL (ref 30.0–36.0)
MCV: 95.7 fL (ref 80.0–100.0)
Platelets: 426 10*3/uL — ABNORMAL HIGH (ref 150–400)
RBC: 2.33 MIL/uL — ABNORMAL LOW (ref 4.22–5.81)
RDW: 14.5 % (ref 11.5–15.5)
WBC: 40.3 10*3/uL — ABNORMAL HIGH (ref 4.0–10.5)
nRBC: 1.7 % — ABNORMAL HIGH (ref 0.0–0.2)

## 2019-09-02 LAB — RENAL FUNCTION PANEL
Albumin: 2.4 g/dL — ABNORMAL LOW (ref 3.5–5.0)
Anion gap: 9 (ref 5–15)
BUN: 42 mg/dL — ABNORMAL HIGH (ref 8–23)
CO2: 22 mmol/L (ref 22–32)
Calcium: 7.7 mg/dL — ABNORMAL LOW (ref 8.9–10.3)
Chloride: 104 mmol/L (ref 98–111)
Creatinine, Ser: 2.42 mg/dL — ABNORMAL HIGH (ref 0.61–1.24)
GFR calc Af Amer: 30 mL/min — ABNORMAL LOW (ref >60)
GFR calc non Af Amer: 26 mL/min — ABNORMAL LOW (ref >60)
Glucose, Bld: 223 mg/dL — ABNORMAL HIGH (ref 70–99)
Phosphorus: 3.9 mg/dL (ref 2.5–4.6)
Potassium: 5.5 mmol/L — ABNORMAL HIGH (ref 3.5–5.1)
Sodium: 135 mmol/L (ref 135–145)

## 2019-09-02 LAB — PROTIME-INR
INR: 3.1 — ABNORMAL HIGH (ref 0.8–1.2)
Prothrombin Time: 31.4 seconds — ABNORMAL HIGH (ref 11.4–15.2)

## 2019-09-02 LAB — CULTURE, BLOOD (ROUTINE X 2): Culture: NO GROWTH

## 2019-09-02 LAB — HEPATIC FUNCTION PANEL
ALT: 132 U/L — ABNORMAL HIGH (ref 0–44)
AST: 124 U/L — ABNORMAL HIGH (ref 15–41)
Albumin: 2.4 g/dL — ABNORMAL LOW (ref 3.5–5.0)
Alkaline Phosphatase: 202 U/L — ABNORMAL HIGH (ref 38–126)
Bilirubin, Direct: 0.4 mg/dL — ABNORMAL HIGH (ref 0.0–0.2)
Indirect Bilirubin: 0.6 mg/dL (ref 0.3–0.9)
Total Bilirubin: 1 mg/dL (ref 0.3–1.2)
Total Protein: 5.8 g/dL — ABNORMAL LOW (ref 6.5–8.1)

## 2019-09-02 LAB — LACTATE DEHYDROGENASE: LDH: 716 U/L — ABNORMAL HIGH (ref 98–192)

## 2019-09-02 LAB — MAGNESIUM: Magnesium: 2.5 mg/dL — ABNORMAL HIGH (ref 1.7–2.4)

## 2019-09-02 LAB — GLUCOSE, CAPILLARY
Glucose-Capillary: 137 mg/dL — ABNORMAL HIGH (ref 70–99)
Glucose-Capillary: 207 mg/dL — ABNORMAL HIGH (ref 70–99)

## 2019-09-02 LAB — APTT: aPTT: 35 seconds (ref 24–36)

## 2019-09-02 LAB — CULTURE, BLOOD (SINGLE): Culture: NO GROWTH

## 2019-09-02 LAB — MRSA PCR SCREENING: MRSA by PCR: NEGATIVE

## 2019-09-02 SURGERY — CANCELLED PROCEDURE

## 2019-09-02 MED ORDER — ACETAMINOPHEN 325 MG PO TABS: 650.0000 mg | ORAL_TABLET | Freq: Four times a day (QID) | ORAL | Status: DC | PRN

## 2019-09-02 MED ORDER — ACETAMINOPHEN 650 MG RE SUPP: 650.0000 mg | Freq: Four times a day (QID) | RECTAL | Status: DC | PRN

## 2019-09-02 MED ORDER — MORPHINE SULFATE (PF) 2 MG/ML IV SOLN: 2.0000 mg | INTRAVENOUS | Status: DC | PRN

## 2019-09-02 MED ORDER — DEXTROSE 5 % IV SOLN: INTRAVENOUS | Status: DC

## 2019-09-02 MED ORDER — GLYCOPYRROLATE 1 MG PO TABS
1.0000 mg | ORAL_TABLET | ORAL | Status: DC | PRN
  Filled 2019-09-02: qty 1

## 2019-09-02 MED ORDER — GLYCOPYRROLATE 0.2 MG/ML IJ SOLN: 0.2000 mg | INTRAMUSCULAR | Status: DC | PRN

## 2019-09-02 MED ORDER — MORPHINE BOLUS VIA INFUSION
5.0000 mg | INTRAVENOUS | Status: DC | PRN
  Filled 2019-09-02: qty 5

## 2019-09-02 MED ORDER — POLYVINYL ALCOHOL 1.4 % OP SOLN
1.0000 [drp] | Freq: Four times a day (QID) | OPHTHALMIC | Status: DC | PRN
  Filled 2019-09-02: qty 15

## 2019-09-02 MED ORDER — MORPHINE 100MG IN NS 100ML (1MG/ML) PREMIX INFUSION
0.0000 mg/h | INTRAVENOUS | Status: DC
  Administered 2019-09-02: 15 mg/h via INTRAVENOUS
  Filled 2019-09-02: qty 100

## 2019-09-02 MED ORDER — DIPHENHYDRAMINE HCL 50 MG/ML IJ SOLN: 25.0000 mg | INTRAMUSCULAR | Status: DC | PRN

## 2019-09-02 MED FILL — Medication: Qty: 1 | Status: AC

## 2019-09-03 LAB — CULTURE, BLOOD (ROUTINE X 2)
Culture: NO GROWTH
Culture: NO GROWTH
Special Requests: ADEQUATE
Special Requests: ADEQUATE

## 2019-09-03 LAB — CULTURE, RESPIRATORY W GRAM STAIN: Culture: NO GROWTH

## 2019-09-05 NOTE — Procedures (Signed)
Extubation Procedure Note  Patient Details:   Name: BING DUFFEY DOB: 1951-01-15 MRN: [id number]   Airway Documentation:    Vent end date: 09/07/2019 Vent end time: 1115   Evaluation  O2 sats: currently acceptable Complications: No apparent complications Patient did tolerate procedure well. Bilateral Breath Sounds: Diminished, Clear   Pt extubated to comfort care per physician order/family request. Pt suctioned orally and via ETT prior. Pt tolerated well. Family remains at bedside.  Jonathon Jordan Witney Huie 2019/09/07, 11:19 AM

## 2019-09-05 NOTE — Progress Notes (Signed)
Pronounced by 2 RN's @ 11.42am

## 2019-09-05 NOTE — Progress Notes (Signed)
Checked on family of Mr. Hoefling who surrounded him through end of life.  Family did not need or want a chaplain, but let them know to have nurse page me if needed.

## 2019-09-05 NOTE — Progress Notes (Addendum)
ANTICOAGULATION CONSULT NOTE - Follow Up Consult  Pharmacy Consult for heparin, warfarin Indication:  afib, mechanical AVR  No Known Allergies  Patient Measurements: Height: 5\' 8"  (172.7 cm) Weight: 178 lb 5.6 oz (80.9 kg) IBW/kg (Calculated) : 68.4  Vital Signs: Temp: 98.1 F (36.7 C) (09/28 0400) Temp Source: Axillary (09/28 0400) Pulse Rate: 65 (09/28 0900)  Labs: Recent Labs    08/31/19 0516  09/01/19 0359  09/01/19 0750 09/01/19 1030 09/01/19 1121 09/01/19 1215 09/01/19 1303 09/01/19 1707 09-19-19 0432  HGB 9.1*  --  7.7*   < >  --   --  7.8*  --  8.2*  --  7.4*  HCT 26.5*  --  24.0*   < >  --   --  23.0*  --  24.0*  --  22.3*  PLT 471*  --  581*  --   --   --   --   --   --   --  426*  APTT  --   --   --   --   --   --   --   --   --   --  35  LABPROT 49.1*  --   --   --   --  80.9*  --  89.6*  --   --  31.4*  INR 5.5*  --   --   --   --  >10.0*  --  >10.0*  --   --  3.1*  CREATININE 4.22*   < > 4.20*  --   --   --   --   --   --  2.71* 2.42*  TROPONINIHS  --   --  1,578*  --  1,632*  --   --   --   --   --   --    < > = values in this interval not displayed.    Estimated Creatinine Clearance: 27.9 mL/min (A) (by C-G formula based on SCr of 2.42 mg/dL (H)).  Assessment:  68 yo M on warfarin pta for afib and mechanical AVR. (PTA wafarin 5mg  Tues/Fri, 2.5mg  other days), INR 5 on admit and increased to > 10 s/p 10mg  IV vitamin K on 9/27.  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 Pharmacy consulted to initiate heparin when INR < 3. -INR= 3.1  PTA wafarin 5mg  Tues/Fri, 2.5mg  other days  Goal of Therapy:  INR 2.5-3.5 Monitor platelets by anticoagulation protocol: Yes   Plan:  -Initiate heparin IV once INR < 3 -Hold warfarin for now (will discuss with MD) -Will recheck INR later today -Daily INR, CBC   Hildred Laser, PharmD Clinical Pharmacist **Pharmacist phone directory can now be found on  [email or web address] ([email or web address]).  Listed under [place].

## 2019-09-05 NOTE — Discharge Summary (Signed)
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  In light of patient's rapidly deteriorating clinical status and lack of realistic therapeutic options, family allowed him to pass away in peace with them at bedside.  Erskine Emery MD PCCM

## 2019-09-05 NOTE — Progress Notes (Signed)
CCM Progress Note  S: Seen in f/u for terminal endocarditis and multiorgan failure.  Persistent pressor and CRRT requirements, less responsive than yesterday.  O: Unresponsive on vent, appears to be suffering. 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  Wife and children would like to focus on allowing him to be comfortably and die on his own terms.  Start opiate drip, anti-secretagogue, benzos and extubate when family is ready.  Erskine Emery MD PCCM

## 2019-09-05 NOTE — Progress Notes (Signed)
Nutrition Brief Note  Chart reviewed. Pt now transitioning to comfort care.  No further nutrition interventions warranted at this time.  Please re-consult as needed.   Keelan Pomerleau RD, LDN Clinical Nutrition Pager # - [phone]    

## 2019-09-05 NOTE — Progress Notes (Signed)
ID PROGRESS NOTE:  [name] is a 68 y.o. 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 Chart review noted for withdrawal of care.   Will stop antibiotics to limit unnecessary treatments given goals of care.    Janene Madeira, MSN, NP-C [facility] [email or web address] Pager: [phone] Office: [phone] [place]: [phone]

## 2019-09-05 NOTE — Progress Notes (Signed)
Progress Note  Patient Name: [name] Date of Encounter: 2019/09/18  Primary Cardiologist: Rozann Lesches, MD   Subjective   Intubated and sedated.  Seizure-like movements.  On pressors, levophed 38, vaso 0.03.  FiO2 80%, PEEP 10.  On CRRT.  Inpatient Medications    Scheduled Meds: . aspirin  81 mg Oral Daily  . atorvastatin  20 mg Oral Daily  . chlorhexidine gluconate (MEDLINE KIT)  15 mL Mouth Rinse BID  . Chlorhexidine Gluconate Cloth  6 each Topical Daily  . feeding supplement (PRO-STAT SUGAR FREE 64)  30 mL Per Tube BID  . feeding supplement (VITAL HIGH PROTEIN)  1,000 mL Per Tube Q24H  . hydrocortisone sod succinate (SOLU-CORTEF) inj  50 mg Intravenous Q6H  . insulin aspart  0-15 Units Subcutaneous Q4H  . insulin glargine  10 Units Subcutaneous BID  . iron polysaccharides  150 mg Oral Daily  . levothyroxine  75 mcg Oral Q0600  . mouth rinse  15 mL Mouth Rinse 10 times per day  . multivitamin with minerals  1 tablet Oral Daily  . pantoprazole (PROTONIX) IV  40 mg Intravenous Daily  . sodium chloride flush  10-40 mL Intracatheter Q12H  . tamsulosin  0.4 mg Oral Daily   Continuous Infusions: .  prismasol BGK 4/2.5 400 mL/hr at 09-18-19 0235  .  prismasol BGK 4/2.5 200 mL/hr at 08/31/19 1832  . sodium chloride Stopped (09-18-19 0010)  . ampicillin (OMNIPEN) IV 2 g (09-18-19 0532)  . cefTRIAXone (ROCEPHIN)  IV Stopped (09/01/19 2307)  . fentaNYL infusion INTRAVENOUS 200 mcg/hr (2019-09-18 0400)  . norepinephrine (LEVOPHED) Adult infusion 38 mcg/min (09/18/2019 0731)  . phenylephrine (NEO-SYNEPHRINE) Adult infusion 15 mcg/min (2019-09-18 0400)  . prismasol BGK 4/2.5 1,500 mL/hr at 2019/09/18 0557  . sodium chloride    . sodium chloride    . vancomycin    . vasopressin (PITRESSIN) infusion - *FOR SHOCK* 0.03 Units/min (09/18/2019 0400)   PRN Meds: alteplase, fentaNYL, guaiFENesin-dextromethorphan, heparin, ipratropium-albuterol, midazolam, nitroGLYCERIN, sodium  chloride, sodium chloride flush   Vital Signs    Vitals:   Sep 18, 2019 0245 09-18-2019 0300 2019/09/18 0400 09-18-19 0500  BP:      Pulse: 66 66    Resp: (!) 24 (!) 24    Temp:      TempSrc:      SpO2: 100% 100% 100%   Weight:    80.9 kg  Height:        Intake/Output Summary (Last 24 hours) at 09/18/2019 0758 Last data filed at 2019/09/18 0400 Gross per 24 hour  Intake 6560.03 ml  Output 3620.4 ml  Net 2939.63 ml   Last 3 Weights 2019-09-18 09/01/2019 08/31/2019  Weight (lbs) 178 lb 5.6 oz 170 lb 3.1 oz 175 lb 1.6 oz  Weight (kg) 80.9 kg 77.2 kg 79.425 kg      Telemetry    Sinus rhythm 70s, PVCs.  NSVT 5 beats.- Personally Reviewed  ECG    No new ECG - Personally Reviewed  Physical Exam   GEN: Intubated and sedated Neck: No JVD Cardiac: irregular, 3/6 systolic murmur Respiratory: Mechanical breath sounds GI: Soft, nontender, non-distended  MS: BLE edema Neuro:  Not following commands.  Having seizure-like movements Psych: Unable to assess  Labs    High Sensitivity Troponin:   Recent Labs  Lab 08/21/2019 1654 08/22/2019 1844 09/01/19 0359 09/01/19 0750  TROPONINIHS 103* 91* 1,578* 1,632*      Chemistry Recent Labs  Lab 08/31/19 0516  09/01/19 0359  09/01/19 1303 09/01/19 1707 2019-09-09 0432  NA 131*   < > 136   < > 135 138 135  K 6.0*   < > 5.2*   < > 5.7* 5.3* 5.5*  CL 94*   < > 98  --   --  106 104  CO2 23   < > 18*  --   --  20* 22  GLUCOSE 307*   < > 245*  --   --  161* 223*  BUN 88*   < > 77*  --   --  47* 42*  CREATININE 4.22*   < > 4.20*  --   --  2.71* 2.42*  CALCIUM 9.0   < > 7.9*  --   --  7.3* 7.7*  PROT 6.5  --  6.2*  --   --   --  5.8*  ALBUMIN 2.7*  --  2.5*  --   --  2.3* 2.4*  2.4*  AST 45*  --  127*  --   --   --  124*  ALT 44  --  97*  --   --   --  132*  ALKPHOS 105  --  145*  --   --   --  202*  BILITOT 0.9  --  1.2  --   --   --  1.0  GFRNONAA 13*   < > 13*  --   --  23* 26*  GFRAA 16*   < > 16*  --   --  27* 30*  ANIONGAP 14   <  > 20*  --   --  12 9   < > = values in this interval not displayed.     Hematology Recent Labs  Lab 08/31/19 0516 09/01/19 0359  09/01/19 1121 09/01/19 1303 09-09-2019 0432  WBC 38.9* 52.5*  --   --   --  40.3*  RBC 2.88* 2.49*  --   --   --  2.33*  HGB 9.1* 7.7*   < > 7.8* 8.2* 7.4*  HCT 26.5* 24.0*   < > 23.0* 24.0* 22.3*  MCV 92.0 96.4  --   --   --  95.7  MCH 31.6 30.9  --   --   --  31.8  MCHC 34.3 32.1  --   --   --  33.2  RDW 13.7 14.2  --   --   --  14.5  PLT 471* 581*  --   --   --  426*   < > = values in this interval not displayed.    BNP Recent Labs  Lab 08/27/2019 1846 08/29/19 0811  BNP 389.1* 546.0*     DDimer  Recent Labs  Lab 08/29/19 1601  DDIMER 4.27*     Radiology    Dg Chest Port 1 View  Result Date: 09/01/2019 CLINICAL DATA:  Post code. EXAM: PORTABLE CHEST 1 VIEW COMPARISON:  Radiograph yesterday FINDINGS: Endotracheal tube tip at the thoracic inlet. Right central venous catheter tip projects over the proximal SVC. No visualized pneumothorax. Post median sternotomy with unchanged cardiomegaly. Diffuse bilateral pulmonary opacities which may be infection or pulmonary edema. Suspected bilateral pleural effusions. No rib fracture visualized by radiograph. IMPRESSION: 1. Endotracheal tube tip at the thoracic inlet. 2. Diffuse bilateral pulmonary opacities which may be infection, pulmonary edema, or combination thereof. Findings are not significantly changed since yesterday. 3. Suspected bilateral pleural effusions. Electronically Signed   By: Aurther Loft.D.  On: 09/01/2019 02:51   Dg Chest Port 1 View  Result Date: 08/31/2019 CLINICAL DATA:  Central line placement EXAM: PORTABLE CHEST 1 VIEW COMPARISON:  August 31, 2019 FINDINGS: New right central line is been placed with distal tip central SVC. No pneumothorax. Diffuse bilateral interstitial opacities remain, similar in the upper lobes. Opacity is more focal in the lateral right lung base left  lung base, worsened in the interval. No other interval changes. IMPRESSION: 1. The new right central line is in good position with no pneumothorax. 2. Bilateral focal pulmonary infiltrates have worsened in the interval. Given the superimposed diffuse interstitial opacities, findings could represent pulmonary edema. However, the findings are concerning for bibasilar multifocal pneumonia. Recommend correlation. Electronically Signed   By: Dorise Bullion III M.D   On: 08/31/2019 13:05   Dg Abd Portable 1v  Result Date: 09/01/2019 CLINICAL DATA:  NG tube placement EXAM: PORTABLE ABDOMEN - 1 VIEW COMPARISON:  No comparison studies available. FINDINGS: NG tube tip is positioned in the gastric fundus. Proximal side port is below the GE junction. Basilar airspace disease noted in the lungs bilaterally. IMPRESSION: NG tube tip is in the gastric fundus. Electronically Signed   By: [name] M.D.   On: 09/01/2019 11:25    Cardiac Studies  TTE 9/27:    1. Left ventricular ejection fraction, by visual estimation, is 50 to 55%. The left ventricle has normal function. Normal left ventricular size. There is no left ventricular hypertrophy.  2. Basal inferior and inferolateral segments are probably hypokinetic.  3. Abnormal septal motion consistent with post-operative status.  4. Global right ventricle has mildly reduced systolic function.The right ventricular size is normal. No increase in right ventricular wall thickness.  5. Left atrial size was moderately dilated.  6. Right atrial size was normal.  7. Mechanical aortic valve prosthesis with markedly thickened annulus, consistent with abscess. Aortic valve regurgitation is severe by color flow Doppler. Prosthetic valve gradients are markedly increased due to aortic insufficiency, not due to  decreased effective orifice area.  8. TR signal is inadequate for assessing pulmonary artery systolic pressure.  9. The pulmonic valve was normal in structure.  Pulmonic valve regurgitation is mild by color flow Doppler. 10. The inferior vena cava the inferior vena cava is plethoric (a nonspecific finding during positive pressure ventilation). 11. The mitral valve is grossly normal. Mild mitral valve regurgitation. No evidence of mitral stenosis. 12. The tricuspid valve is grossly normal. Tricuspid valve regurgitation was not visualized by color flow Doppler. 13. COMPARED TO the previous study from 09/24, the previously described large tricuspid valve vegetation is no longer seen and has likely embolized. 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  Patient Profile     68 y.o. male with a hx of CAD s/p CABG, afib s/p maze procedure, Chronic diastolic HF, aortic stenosis s/p mechanical AVR in 2013 on coumadin, DM2, hypothyroidismwho is beingfollowed by cardiologyfor the evaluation ofafib,CHF, and endocarditis.    Assessment & Plan    Prosthetic valve endocarditis: p/w enterococcus bacteremia and initial TTE shows TV vegetation. TTE yesterday concerning for mechanical AV abscess with severe PVL. Surgery would be prohibitively high risk given septic shock and multiorgan failure, currently on high dose pressors and CRRT.  Agree with continued goals of care discussions.     For questions or updates, please contact [place] Please consult [email or web address] for contact info under        Signed, Donato Heinz, MD  09-18-19, 7:58 AM

## 2019-09-05 NOTE — Progress Notes (Signed)
Wasted 52mls of morphine in stericycle and 148ml of fentanyl in stericycle. Witnessed by Mollie Germany RN.

## 2019-09-05 DEATH — deceased

## 2019-09-06 LAB — CULTURE, BLOOD (ROUTINE X 2)
Culture: NO GROWTH
Culture: NO GROWTH
Special Requests: ADEQUATE
Special Requests: ADEQUATE

## 2019-09-09 ENCOUNTER — Telehealth: Payer: Self-pay

## 2019-09-09 NOTE — Telephone Encounter (Signed)
Received dc from [employer].  DC is for burial and a patient of Doctor Tamala Julian.   DC will be taken to [facility] for signature.  On 09/12/2019 Received signed dc back from Doctor Tamala Julian. I called the funeral home and Fransisco Beau with [employer] [facility] is going to come by and pick it up tomorrow am.

## 2019-10-14 ENCOUNTER — Ambulatory Visit: Payer: BC Managed Care – PPO | Admitting: Cardiology
